# Patient Record
Sex: Female | Born: 1980
Health system: Southern US, Community
[De-identification: ages and names within clinical notes are randomized; demographics above are authoritative.]

## PROBLEM LIST (undated history)

## (undated) ENCOUNTER — Inpatient Hospital Stay (HOSPITAL_COMMUNITY): Payer: Self-pay

## (undated) DIAGNOSIS — Z87442 Personal history of urinary calculi: Secondary | ICD-10-CM

## (undated) DIAGNOSIS — I10 Essential (primary) hypertension: Secondary | ICD-10-CM

## (undated) DIAGNOSIS — N83209 Unspecified ovarian cyst, unspecified side: Secondary | ICD-10-CM

## (undated) DIAGNOSIS — N200 Calculus of kidney: Secondary | ICD-10-CM

## (undated) HISTORY — DX: Essential (primary) hypertension: I10

## (undated) HISTORY — DX: Calculus of kidney: N20.0

---
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
## 2002-03-05 ENCOUNTER — Encounter: Admission: RE | Admit: 2002-03-05 | Discharge: 2002-03-05 | Payer: Self-pay | Admitting: Family Medicine

## 2002-06-02 ENCOUNTER — Encounter: Admission: RE | Admit: 2002-06-02 | Discharge: 2002-06-02 | Payer: Self-pay | Admitting: Family Medicine

## 2002-07-02 ENCOUNTER — Encounter: Admission: RE | Admit: 2002-07-02 | Discharge: 2002-07-02 | Payer: Self-pay | Admitting: Family Medicine

## 2002-08-31 ENCOUNTER — Encounter: Admission: RE | Admit: 2002-08-31 | Discharge: 2002-08-31 | Payer: Self-pay | Admitting: Family Medicine

## 2002-10-18 ENCOUNTER — Encounter: Admission: RE | Admit: 2002-10-18 | Discharge: 2002-10-18 | Payer: Self-pay | Admitting: Sports Medicine

## 2002-10-18 ENCOUNTER — Other Ambulatory Visit: Admission: RE | Admit: 2002-10-18 | Discharge: 2002-10-18 | Payer: Self-pay | Admitting: Family Medicine

## 2002-10-18 ENCOUNTER — Encounter (INDEPENDENT_AMBULATORY_CARE_PROVIDER_SITE_OTHER): Payer: Self-pay | Admitting: *Deleted

## 2003-04-01 ENCOUNTER — Encounter: Admission: RE | Admit: 2003-04-01 | Discharge: 2003-04-01 | Payer: Self-pay | Admitting: Family Medicine

## 2003-04-15 ENCOUNTER — Encounter: Admission: RE | Admit: 2003-04-15 | Discharge: 2003-04-15 | Payer: Self-pay | Admitting: Family Medicine

## 2003-11-22 ENCOUNTER — Encounter (INDEPENDENT_AMBULATORY_CARE_PROVIDER_SITE_OTHER): Payer: Self-pay | Admitting: *Deleted

## 2003-11-22 LAB — CONVERTED CEMR LAB

## 2003-11-30 ENCOUNTER — Other Ambulatory Visit: Admission: RE | Admit: 2003-11-30 | Discharge: 2003-11-30 | Payer: Self-pay | Admitting: Family Medicine

## 2003-11-30 ENCOUNTER — Encounter: Admission: RE | Admit: 2003-11-30 | Discharge: 2003-11-30 | Payer: Self-pay | Admitting: Family Medicine

## 2003-12-14 ENCOUNTER — Encounter: Admission: RE | Admit: 2003-12-14 | Discharge: 2003-12-14 | Payer: Self-pay | Admitting: Family Medicine

## 2006-02-19 ENCOUNTER — Encounter: Payer: Self-pay | Admitting: Obstetrics & Gynecology

## 2006-11-10 ENCOUNTER — Emergency Department (HOSPITAL_COMMUNITY): Admission: EM | Admit: 2006-11-10 | Discharge: 2006-11-10 | Payer: Self-pay | Admitting: Emergency Medicine

## 2006-12-18 DIAGNOSIS — F17200 Nicotine dependence, unspecified, uncomplicated: Secondary | ICD-10-CM | POA: Insufficient documentation

## 2006-12-19 ENCOUNTER — Encounter (INDEPENDENT_AMBULATORY_CARE_PROVIDER_SITE_OTHER): Payer: Self-pay | Admitting: *Deleted

## 2007-01-13 ENCOUNTER — Inpatient Hospital Stay (HOSPITAL_COMMUNITY): Admission: AD | Admit: 2007-01-13 | Discharge: 2007-01-13 | Payer: Self-pay | Admitting: Gynecology

## 2007-06-03 ENCOUNTER — Ambulatory Visit: Payer: Self-pay | Admitting: Family Medicine

## 2007-06-03 ENCOUNTER — Encounter (INDEPENDENT_AMBULATORY_CARE_PROVIDER_SITE_OTHER): Payer: Self-pay | Admitting: Family Medicine

## 2007-06-03 DIAGNOSIS — N870 Mild cervical dysplasia: Secondary | ICD-10-CM | POA: Insufficient documentation

## 2007-06-03 LAB — CONVERTED CEMR LAB
Chlamydia, DNA Probe: NEGATIVE
Glucose, Bld: 82 mg/dL (ref 70–99)
LDL Cholesterol: 102 mg/dL — ABNORMAL HIGH (ref 0–99)
Triglycerides: 53 mg/dL (ref <150)
VLDL: 11 mg/dL (ref 0–40)
Whiff Test: POSITIVE

## 2007-06-18 ENCOUNTER — Ambulatory Visit: Payer: Self-pay | Admitting: Family Medicine

## 2007-06-18 LAB — CONVERTED CEMR LAB: Beta hcg, urine, semiquantitative: NEGATIVE

## 2007-06-30 ENCOUNTER — Encounter (INDEPENDENT_AMBULATORY_CARE_PROVIDER_SITE_OTHER): Payer: Self-pay | Admitting: Family Medicine

## 2007-07-30 ENCOUNTER — Telehealth: Payer: Self-pay | Admitting: *Deleted

## 2007-09-04 ENCOUNTER — Ambulatory Visit: Payer: Self-pay | Admitting: Family Medicine

## 2007-11-06 ENCOUNTER — Telehealth: Payer: Self-pay | Admitting: *Deleted

## 2007-11-09 ENCOUNTER — Ambulatory Visit: Payer: Self-pay | Admitting: Sports Medicine

## 2007-11-09 ENCOUNTER — Encounter (INDEPENDENT_AMBULATORY_CARE_PROVIDER_SITE_OTHER): Payer: Self-pay | Admitting: Family Medicine

## 2007-11-09 DIAGNOSIS — I10 Essential (primary) hypertension: Secondary | ICD-10-CM | POA: Insufficient documentation

## 2007-11-09 LAB — CONVERTED CEMR LAB
BUN: 8 mg/dL (ref 6–23)
Beta hcg, urine, semiquantitative: NEGATIVE
Blood in Urine, dipstick: NEGATIVE
CO2: 22 meq/L (ref 19–32)
Chloride: 107 meq/L (ref 96–112)
Glucose, Bld: 86 mg/dL (ref 70–99)
Glucose, Urine, Semiquant: NEGATIVE
Nitrite: NEGATIVE
Potassium: 3.8 meq/L (ref 3.5–5.3)
Sodium: 140 meq/L (ref 135–145)
Specific Gravity, Urine: 1.025
pH: 6.5

## 2008-07-08 ENCOUNTER — Telehealth (INDEPENDENT_AMBULATORY_CARE_PROVIDER_SITE_OTHER): Payer: Self-pay | Admitting: *Deleted

## 2008-07-08 ENCOUNTER — Ambulatory Visit: Payer: Self-pay | Admitting: Family Medicine

## 2008-07-14 ENCOUNTER — Ambulatory Visit: Payer: Self-pay | Admitting: Family Medicine

## 2008-07-14 ENCOUNTER — Encounter (INDEPENDENT_AMBULATORY_CARE_PROVIDER_SITE_OTHER): Payer: Self-pay | Admitting: Family Medicine

## 2008-07-14 LAB — CONVERTED CEMR LAB: Beta hcg, urine, semiquantitative: NEGATIVE

## 2008-07-15 LAB — CONVERTED CEMR LAB: GC Probe Amp, Genital: NEGATIVE

## 2008-07-19 ENCOUNTER — Encounter (INDEPENDENT_AMBULATORY_CARE_PROVIDER_SITE_OTHER): Payer: Self-pay | Admitting: Family Medicine

## 2008-07-21 ENCOUNTER — Emergency Department (HOSPITAL_COMMUNITY): Admission: EM | Admit: 2008-07-21 | Discharge: 2008-07-21 | Payer: Self-pay | Admitting: Family Medicine

## 2008-10-04 ENCOUNTER — Encounter (INDEPENDENT_AMBULATORY_CARE_PROVIDER_SITE_OTHER): Payer: Self-pay | Admitting: Family Medicine

## 2008-10-04 ENCOUNTER — Ambulatory Visit: Payer: Self-pay | Admitting: Family Medicine

## 2008-10-04 LAB — CONVERTED CEMR LAB
ALT: 10 units/L (ref 0–35)
AST: 9 units/L (ref 0–37)
Beta hcg, urine, semiquantitative: NEGATIVE
CO2: 19 meq/L (ref 19–32)
Calcium: 8.5 mg/dL (ref 8.4–10.5)
Chloride: 109 meq/L (ref 96–112)
Creatinine, Ser: 0.56 mg/dL (ref 0.40–1.20)
Sodium: 138 meq/L (ref 135–145)
Total CHOL/HDL Ratio: 3.8
Total Protein: 6.8 g/dL (ref 6.0–8.3)
VLDL: 7 mg/dL (ref 0–40)

## 2008-10-05 ENCOUNTER — Encounter (INDEPENDENT_AMBULATORY_CARE_PROVIDER_SITE_OTHER): Payer: Self-pay | Admitting: Family Medicine

## 2008-10-31 ENCOUNTER — Telehealth: Payer: Self-pay | Admitting: *Deleted

## 2008-10-31 ENCOUNTER — Ambulatory Visit: Payer: Self-pay | Admitting: Family Medicine

## 2008-10-31 LAB — CONVERTED CEMR LAB
Bilirubin Urine: NEGATIVE
Glucose, Urine, Semiquant: NEGATIVE
Ketones, urine, test strip: NEGATIVE
pH: 8.5

## 2008-11-01 ENCOUNTER — Encounter: Payer: Self-pay | Admitting: Family Medicine

## 2008-11-16 ENCOUNTER — Ambulatory Visit: Payer: Self-pay | Admitting: Family Medicine

## 2008-11-16 ENCOUNTER — Telehealth (INDEPENDENT_AMBULATORY_CARE_PROVIDER_SITE_OTHER): Payer: Self-pay | Admitting: Family Medicine

## 2008-11-16 LAB — CONVERTED CEMR LAB
Nitrite: NEGATIVE
Protein, U semiquant: 30
WBC Urine, dipstick: NEGATIVE

## 2008-11-20 ENCOUNTER — Emergency Department (HOSPITAL_COMMUNITY): Admission: EM | Admit: 2008-11-20 | Discharge: 2008-11-20 | Payer: Self-pay | Admitting: Emergency Medicine

## 2008-11-22 ENCOUNTER — Encounter (INDEPENDENT_AMBULATORY_CARE_PROVIDER_SITE_OTHER): Payer: Self-pay | Admitting: Family Medicine

## 2008-12-01 ENCOUNTER — Emergency Department (HOSPITAL_COMMUNITY): Admission: EM | Admit: 2008-12-01 | Discharge: 2008-12-01 | Payer: Self-pay | Admitting: Family Medicine

## 2008-12-02 ENCOUNTER — Ambulatory Visit: Payer: Self-pay | Admitting: Family Medicine

## 2009-05-02 ENCOUNTER — Ambulatory Visit: Payer: Self-pay | Admitting: Family Medicine

## 2009-05-02 ENCOUNTER — Encounter: Payer: Self-pay | Admitting: Family Medicine

## 2009-09-01 ENCOUNTER — Telehealth: Payer: Self-pay | Admitting: Family Medicine

## 2009-09-04 ENCOUNTER — Ambulatory Visit: Payer: Self-pay | Admitting: Family Medicine

## 2009-11-28 ENCOUNTER — Telehealth: Payer: Self-pay | Admitting: Family Medicine

## 2009-11-29 ENCOUNTER — Ambulatory Visit: Payer: Self-pay | Admitting: Family Medicine

## 2010-01-08 ENCOUNTER — Encounter: Payer: Self-pay | Admitting: Family Medicine

## 2010-05-14 ENCOUNTER — Telehealth: Payer: Self-pay | Admitting: Family Medicine

## 2010-05-14 ENCOUNTER — Ambulatory Visit: Payer: Self-pay | Admitting: Family Medicine

## 2010-05-14 DIAGNOSIS — N939 Abnormal uterine and vaginal bleeding, unspecified: Secondary | ICD-10-CM

## 2010-05-14 DIAGNOSIS — N926 Irregular menstruation, unspecified: Secondary | ICD-10-CM | POA: Insufficient documentation

## 2010-05-14 LAB — CONVERTED CEMR LAB: Beta hcg, urine, semiquantitative: NEGATIVE

## 2010-06-20 ENCOUNTER — Telehealth: Payer: Self-pay | Admitting: Family Medicine

## 2010-06-20 ENCOUNTER — Ambulatory Visit: Payer: Self-pay | Admitting: Family Medicine

## 2010-07-12 ENCOUNTER — Encounter: Payer: Self-pay | Admitting: Family Medicine

## 2010-07-12 ENCOUNTER — Telehealth: Payer: Self-pay | Admitting: Family Medicine

## 2010-07-12 ENCOUNTER — Ambulatory Visit: Payer: Self-pay | Admitting: Family Medicine

## 2010-08-24 ENCOUNTER — Encounter: Payer: Self-pay | Admitting: Family Medicine

## 2010-09-11 ENCOUNTER — Encounter: Payer: Self-pay | Admitting: Family Medicine

## 2010-09-11 ENCOUNTER — Ambulatory Visit: Payer: Self-pay | Admitting: Family Medicine

## 2010-09-11 LAB — CONVERTED CEMR LAB
Chlamydia, DNA Probe: NEGATIVE
GC Probe Amp, Genital: NEGATIVE

## 2010-11-20 NOTE — Assessment & Plan Note (Signed)
Summary: heavy ,painful menses//saunders   Vital Signs:  Patient profile:   30 year old female Weight:      186.0 pounds BMI:     31.06 Temp:     98.9 degrees [place] Pulse rate:   74 / minute BP sitting:   152 / 94  (left arm)  Vitals Entered By: Angeline Slim MD (May 14, 2010 11:12 AM) CC: painful, heavey menses Is Patient Diabetic? No Pain Assessment Patient in pain? yes     Location: abdomen Intensity: 8   Primary Care Provider:  Angelena Sole MD  CC:  painful and heavey menses.  History of Present Illness: Marie [place] with heavy menses x this cycle.  Last sexual intercourse last month.  G1P1.  Usually periods last 5-6 days.  First 2 days usually heavy, changing pads every 2 hrs.  Next 3-4 days changing pads every 4 hrs or so.  Usually has regular periods, monthly.  Sexually active with one partner.  Uses condoms, 100%.  Does not desire pregnancy.   This period is different because it is heavier.  Started on Wed, today is day #6.  She is heaving blood clots, changing pads every 3 hrs.  Cramping more than usual.  She usually have cramping in first 2 days, but she is still cramping now. No nausea, vomiting, no fever, no chills.  No dysuria.  No frequency.  NO dyspnea, fatigue, palpitations.  Last month she had vomiting x 2-3 days, but then her period started so she did not think anything of it.   Abd pain and cramping not relieved with OTC pain meds.    Habits & Providers  Alcohol-Tobacco-Diet     Tobacco Status: current     Cigarette Packs/Day: 0.5  Current Medications (verified): 1)  Lisinopril-Hydrochlorothiazide 20-25 Mg Tabs (Lisinopril-Hydrochlorothiazide) .... Take 1 Tab By Mouth Daily 2)  Hydrocodone-Homatropine 5-1.5 Mg/15ml Syrp (Hydrocodone-Homatropine) .... 1/2 To 1 Teaspoon By Mouth Every 12 Hours As Needed; 3)  Pseudoephedrine Hcl 60 Mg Tabs (Pseudoephedrine Hcl) .... One By Mouth Every 4-6 Hours As Needed 4)  Provera 10 Mg Tabs (Medroxyprogesterone Acetate) .Marland Kitchen.. 1  Tab By Mouth Daily X 5 Days -10 Days 5)  Norco 5-325 Mg Tabs (Hydrocodone-Acetaminophen) .Marland Kitchen.. 1 Tab By Mouth By Mouth Every 4-6 Hrs As Needed Pain  Allergies (verified): No Known Drug Allergies  Review of Systems       per hpi   Physical Exam  General:  Well-developed,well-nourished,in no acute distress; alert,appropriate and cooperative throughout examination. vitals reviewed.  Abdomen:  Bowel sounds positive,abdomen soft and non-tender without masses, organomegaly or hernias noted. Genitalia:  Normal introitus for age, no external lesions, no vaginal discharge, mucosa pink and moist, no vaginal or cervical lesions, no vaginal atrophy, no friaility, normal uterus size and position, no adnexal masses or tenderness +vaginal bleeding, clots seen.     Impression & Recommendations:  Problem # 1:  ABNORMAL VAGINAL BLEEDING (ICD-626.9) Assessment New First episode of abnormal uterine bleeding.  Besides bleeding, abd pain and cramping pt has no other complains.  Upreg negative.  Pt bleeding so cannot do pap or check gc/chlam.  Will stop bleeding with Provera.  Pt to rtc if still bleeding after 10 days.  If bleeding stops and return with next cycle, will do further work up [employer] u/s, abd u/s, TSH, LH/FSH).  Will give Hydrocodone 5-325 #15 tabs for pain.   Her updated medication list for this problem includes:    Provera 10 Mg Tabs (Medroxyprogesterone acetate) .Marland KitchenMarland KitchenMarland KitchenMarland Kitchen  1 tab by mouth daily x 5 days -10 days  Orders: U Preg-[facility] (81025) [facility]- Est Level  3 (62229)  Complete Medication List: 1)  Lisinopril-hydrochlorothiazide 20-25 Mg Tabs (Lisinopril-hydrochlorothiazide) .... Take 1 tab by mouth daily 2)  Hydrocodone-homatropine 5-1.5 Mg/2ml Syrp (Hydrocodone-homatropine) .... 1/2 to 1 teaspoon by mouth every 12 hours as needed; 3)  Pseudoephedrine Hcl 60 Mg Tabs (Pseudoephedrine hcl) .... One by mouth every 4-6 hours as needed 4)  Provera 10 Mg Tabs (Medroxyprogesterone acetate) .Marland Kitchen.. 1 tab by  mouth daily x 5 days -10 days 5)  Norco 5-325 Mg Tabs (Hydrocodone-acetaminophen) .Marland Kitchen.. 1 tab by mouth by mouth every 4-6 hrs as needed pain  Patient Instructions: 1)  Please schedule a follow-up appointment if the medication we give you does not stop your bleeding in 10 days.  2)  If you have abnormal bleeding again please make an appointment with [place] for further work up. 3)  You need to have a Pap Smear to prevent cervical cancer for Sept. 4)  New med: Provera 10mg  daily 5-10 days.  Continue taking this medication for 2 extra when your period stops.  Prescriptions: NORCO 5-325 MG TABS (HYDROCODONE-ACETAMINOPHEN) 1 tab by mouth by mouth every 4-6 hrs as needed pain  #15 x 0   Entered and Authorized by:   Angeline Slim MD   Signed by:   Angeline Slim MD on 05/14/2010   Method used:   Telephoned to ...       [facility] [phone]* (retail)       [address]       [place], [place]  [address]       Ph: [phone]       Fax: [phone]   RxID:   [id number] PROVERA 10 MG TABS (MEDROXYPROGESTERONE ACETATE) 1 tab by mouth daily x 5 days -10 days  #10 x 0   Entered and Authorized by:   Angeline Slim MD   Signed by:   Angeline Slim MD on 05/14/2010   Method used:   Electronically to        [facility] [phone]* (retail)       [address]       [place], [place]  [address]       Ph: [phone]       Fax: [phone]   RxID:   [id number]   Laboratory Results   Urine Tests  Date/Time Received: May 14, 2010 11:45 AM  Date/Time Reported: May 14, 2010 12:04 PM     Urine HCG: negative Comments: ...........test performed by...........Marland KitchenTerese Door, CMA       Prevention & Chronic Care Immunizations   Influenza vaccine: refused  (07/14/2008)   Influenza vaccine due: Refused  (07/14/2008)    Tetanus booster: 07/14/2008: given   Tetanus booster due: 07/14/2018    Pneumococcal vaccine: Not documented  Other Screening   Pap  smear: NEGATIVE FOR INTRAEPITHELIAL LESIONS OR MALIGNANCY.  (07/14/2008)   Pap smear due: 11/21/2004   Smoking status: current  (05/14/2010)   Smoking cessation counseling: yes  (11/16/2008)  Hypertension   Last Blood Pressure: 152 / 94  (05/14/2010)   Serum creatinine: 0.56  (10/04/2008)   Serum potassium 4.1  (10/04/2008)  Self-Management Support :   Personal Goals (by the next clinic visit) :      Personal blood pressure goal: 130/80  (09/04/2009)   Hypertension self-management  support: Not documented

## 2010-11-20 NOTE — Assessment & Plan Note (Signed)
Summary: cough & cold/Marie Lawrence/saunders   Vital Signs:  Patient profile:   30 year old female Height:      65 inches Weight:      192 pounds BMI:     32.07 BSA:     1.95 Temp:     97.9 degrees F Pulse rate:   82 / minute BP sitting:   130 / 85  Vitals Entered By: Jone Baseman CMA (November 29, 2009 8:55 AM) CC: cough and cold x 2 days Is Patient Diabetic? No Pain Assessment Patient in pain? no        Primary Care Provider:  Cyndia Bent MD  CC:  cough and cold x 2 days.  History of Present Illness: 1. 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 Has a scratchy throat but no pain. No problems swallowing.   Did not get flu shot this year.   Habits & Providers  Alcohol-Tobacco-Diet     Tobacco Status: current     Tobacco Counseling: to quit use of tobacco products     Cigarette Packs/Day: 0.5  Current Medications (verified): 1)  Lisinopril-Hydrochlorothiazide 20-25 Mg Tabs (Lisinopril-Hydrochlorothiazide) .... Take 1 Tab By Mouth Daily  Allergies (verified): No Known Drug Allergies  Past History:  Past Medical History: Last updated: 11/09/2007 abnormal paps 2002 -CIN 1, Hx of chlamydia x 2 in 2002, Colposcopy CIN I - has had one normal pap since this time G1P1001. HTN  Review of Systems       no chest pain or SOB; no nausea, vomting, diarrhea; no rash, joint or muscle aches; no fever.   Physical Exam  Additional Exam:  General:  Vital signs reviewed --  Alert, appropriate; well-dressed and well-nourished HEENT: EOMI, sclerae clear, OP pink and moist, no erythema or exudate; TMs unremarkable bilaterally Neck: normal ROM, no stiffness. Mild cervical LAD Lungs:  work of breathing unlabored, clear to auscultation bilaterally; no wheezes, rales, or ronchi; good air movement throughout Heart:  regular rate and rhythm, no murmurs; normal s1/s2 Pulses:  DP and  radial pulses 2+ bilaterally  Extremities:  no cyanosis, clubbing, or edema Skin: good turgor, brisk cap refill; no rash Neurologic:  alert and oriented. speech normal.    Impression & Recommendations:  Problem # 1:  UPPER RESPIRATORY INFECTION, VIRAL (ICD-465.9) Assessment New  benign exam. Supportive care.  See instructions.  Her updated medication list for this problem includes:    Hydrocodone-homatropine 5-1.5 Mg/34ml Syrp (Hydrocodone-homatropine) .Marland Kitchen... 1/2 to 1 teaspoon by mouth every 12 hours as needed;  Orders: [facility]- Est Level  3 (46962)  Complete Medication List: 1)  Lisinopril-hydrochlorothiazide 20-25 Mg Tabs (Lisinopril-hydrochlorothiazide) .... Take 1 tab by mouth daily 2)  Hydrocodone-homatropine 5-1.5 Mg/69ml Syrp (Hydrocodone-homatropine) .... 1/2 to 1 teaspoon by mouth every 12 hours as needed; 3)  Pseudoephedrine Hcl 60 Mg Tabs (Pseudoephedrine hcl) .... One by mouth every 4-6 hours as needed  Patient Instructions: 1)  This is a virus. It should run its course in about 7-10 days. 2)  Take ibuprofen 600 mg three times a day to help with pain, headache and fever. Take with food. 3)  Take pseudoephedrine to help with congestion and headache.  4)  Some times gargling with saltwater can help your throat.  5)  Make sure you're drinking plenty of fluids. Your urine should run clear. 6)  Call if you feel worse suddenly or  if not some better in the next 7 days.  Prescriptions: PSEUDOEPHEDRINE HCL 60 MG TABS (PSEUDOEPHEDRINE HCL) one by mouth every 4-6 hours as needed  #30 x 0   Entered and Authorized by:   Myrtie Soman  MD   Signed by:   Myrtie Soman  MD on 11/29/2009   Method used:   Print then Give to Patient   RxID:   [id number] HYDROCODONE-HOMATROPINE 5-1.5 MG/5ML SYRP (HYDROCODONE-HOMATROPINE) 1/2 to 1 teaspoon by mouth every 12 hours as needed;  #60 cc x 0   Entered and Authorized by:   Myrtie Soman  MD   Signed by:   Myrtie Soman  MD on 11/29/2009    Method used:   Print then Give to Patient   RxID:   [id number] PSEUDOEPHEDRINE HCL 60 MG TABS (PSEUDOEPHEDRINE HCL) one by mouth every 4-6 hours as needed  #30 x 0   Entered and Authorized by:   Myrtie Soman  MD   Signed by:   Myrtie Soman  MD on 11/29/2009   Method used:   Print then Give to Patient   RxID:   [id number] HYDROCODONE-HOMATROPINE 5-1.5 MG/5ML SYRP (HYDROCODONE-HOMATROPINE) 1/2 to 1 teaspoon by mouth every 12 hours as needed;  #60 cc x 0   Entered and Authorized by:   Myrtie Soman  MD   Signed by:   Myrtie Soman  MD on 11/29/2009   Method used:   Print then Give to Patient   RxID:   [id number]

## 2010-11-20 NOTE — Letter (Signed)
Summary: Out of Work  [facility]  [address]   [place], [place] [address]   Phone: [phone]  Fax: [phone]    July 12, 2010   Employee:  GARGI BERCH Mackins    To Whom It May Concern:   For Medical reasons, please excuse the above named employee from work for the following dates:  Start: 07/11/10  Return: 07/13/10     If you need additional information, please feel free to contact our office.         Sincerely,    Bobby Rumpf  MD

## 2010-11-20 NOTE — Progress Notes (Signed)
Summary: triage  Phone Note Call from Patient Call back at Home Phone [phone]   Caller: Patient Summary of Call: still passing large clots Initial call taken by: De Nurse,  June 20, 2010 8:43 AM  Follow-up for Phone Call        was seen in July. that period stopped &  she started bleeding  9 days ago. painful 1st 2 days. better now. heavy flow & large clots. she states she is worried & wants to see md. appt at 2:30 with her pcp. advised to drink plenty of water Follow-up by: Golden Circle RN,  June 20, 2010 8:48 AM

## 2010-11-20 NOTE — Assessment & Plan Note (Signed)
Summary: n&v,diarrhea-note for work/[place]/saunders   Vital Signs:  Patient profile:   30 year old female Height:      65 inches Weight:      183.4 pounds BMI:     30.63 Temp:     98.4 degrees F oral Pulse rate:   80 / minute BP sitting:   131 / 90  (left arm) Cuff size:   regular  Vitals Entered By: Garen Grams LPN (July 12, 2010 4:09 PM) CC: diarrhea/nausea/vomiting/headache x 2 days Is Patient Diabetic? No Pain Assessment Patient in pain? no        Primary Care Provider:  Angelena Sole MD  CC:  diarrhea/nausea/vomiting/headache x 2 days.  History of Present Illness: 1) Diarrhea / emesis: Nausea and vomiting (now resolved) and diarrhea  x 2 days. Mild headache as well now resolved. Emesis contained food, occurred 2-3 times yesterday, now resolved. Diarrhea occurred all night light night, now resolved. Recent travel to [place] last weekend. Denies sick contacts or new foods. LMP was 3 weeks ago.   ROS: Denies fever, chills, rash, melena, hematochezia, bilious emesis  Current Medications (verified): 1)  Lisinopril-Hydrochlorothiazide 20-25 Mg Tabs (Lisinopril-Hydrochlorothiazide) .... Take 1 Tab By Mouth Daily 2)  Pseudoephedrine Hcl 60 Mg Tabs (Pseudoephedrine Hcl) .... One By Mouth Every 4-6 Hours As Needed 3)  Provera 10 Mg Tabs (Medroxyprogesterone Acetate) .Marland Kitchen.. 1 Tab By Mouth Daily X 5 Days -10 Days 4)  Sprintec 28 0.25-35 Mg-Mcg Tabs (Norgestimate-Eth Estradiol) .Marland Kitchen.. 1 Tab By Mouth Daily As Directed Dispo: 1 Pack  Allergies (verified): No Known Drug Allergies  Physical Exam  General:  obese, tired-appearing, NAD, vitals reviewed  Mouth:  moist membranes  Neck:  no lymphadenopathy   Lungs:  normal respiratory effort and no wheezes.   Abdomen:  Bowel sounds positive,abdomen soft and non-tender without masses, organomegaly or hernias noted. Pulses:  2+ radials  Skin:  turgor normal and color normal.     Impression & Recommendations:  Problem # 1:   DIARRHEA (ICD-787.91) Assessment New  Likely viral gastroenteritis. No red flags for more serious etiology on history or exam. Symptoms improving. Does not want antiemetic at this time. Advised to use Imodium for diarrhea as needed. Follow up if symptoms worsening.   Orders: [facility]- Est Level  3 (04540)  Complete Medication List: 1)  Lisinopril-hydrochlorothiazide 20-25 Mg Tabs (Lisinopril-hydrochlorothiazide) .... Take 1 tab by mouth daily 2)  Pseudoephedrine Hcl 60 Mg Tabs (Pseudoephedrine hcl) .... One by mouth every 4-6 hours as needed 3)  Provera 10 Mg Tabs (Medroxyprogesterone acetate) .Marland Kitchen.. 1 tab by mouth daily x 5 days -10 days 4)  Sprintec 28 0.25-35 Mg-mcg Tabs (Norgestimate-eth estradiol) .Marland Kitchen.. 1 tab by mouth daily as directed dispo: 1 pack  Patient Instructions: 1)  You can take Imodium for diarrhea, 2)  Continue to stay well hydrated. 3)  IF things are getting worse, give [place] a call.

## 2010-11-20 NOTE — Miscellaneous (Signed)
  Clinical Lists Changes  Problems: Removed problem of EPISTAXIS (ICD-784.7) Removed problem of SKIN LESION (ICD-709.9) Removed problem of UTI'S, RECURRENT (ICD-599.0) Removed problem of ABDOMINAL PAIN, LEFT UPPER QUADRANT (ICD-789.02) Removed problem of PYELONEPHRITIS, ACUTE (ICD-590.10) Removed problem of FLANK PAIN, LEFT (ICD-789.09) Removed problem of SCREENING FOR MALIGNANT NEOPLASM OF THE CERVIX (ICD-V76.2) Removed problem of ABNORMAL VAGINAL BLEEDING (ICD-626.9) Removed problem of RIB PAIN, LEFT SIDED (ICD-786.50) Removed problem of PREVENTIVE HEALTH CARE (ICD-V70.0) Removed problem of SCREENING, DIABETES MELLITUS (ICD-V77.1) Removed problem of SEXUALLY TRANSMITTED DISEASE, EXPOSURE TO (ICD-V01.6) Removed problem of SCREENING FOR LIPOID DISORDERS (ICD-V77.91) Removed problem of AMENORRHEA (ICD-626.0)

## 2010-11-20 NOTE — Progress Notes (Signed)
Summary: triage  Phone Note Call from Patient Call back at Home Phone [phone]   Caller: Patient Summary of Call: wants to come in tomorrow for cough/scratchy throat/ Initial call taken by: De Nurse,  November 28, 2009 3:29 PM  Follow-up for Phone Call        she will come to 8:30 work in spot. taking OTCs Follow-up by: Golden Circle RN,  November 28, 2009 3:42 PM

## 2010-11-20 NOTE — Letter (Signed)
Summary: Out of Work  [facility]  [address]   [place], [place] [address]   Phone: [phone]  Fax: [phone]    January 08, 2010   Employee:  BRENLYN BESHARA Johns    To Whom It May Concern:   For Medical reasons, please excuse the above named employee from work for the following dates:  Start:  January 08, 2010   End:  January 08, 2010   Patient's daughter was seen this AM in clinic.  If you need additional information, please feel free to contact our office.         Sincerely,    Eustaquio Boyden  MD

## 2010-11-20 NOTE — Progress Notes (Signed)
Summary: triage  Phone Note Call from Patient Call back at Home Phone [phone]   Caller: Patient Summary of Call: Pt throwing up and has diahrrea. Initial call taken by: Clydell Hakim,  July 12, 2010 11:05 AM  Follow-up for Phone Call        started last night. no one else is sick. no fever. last vomit 30 minutes ago.  diarrhea x 3 hrs. last one at 9am. she is at work & has to leave. did not want an appt until I told her will need to see md if she needs a note for work. she cannot get here until late today. will see Dr. Wallene Huh at 4 Follow-up by: Golden Circle RN,  July 12, 2010 11:06 AM

## 2010-11-20 NOTE — Progress Notes (Signed)
Summary: triage  Phone Note Call from Patient Call back at Home Phone [phone]   Caller: Patient Summary of Call: Pt is on her menstral and her stomach is very sore and she has been bleeding since last Wednesday and the blood is very dark. Initial call taken by: Clydell Hakim,  May 14, 2010 9:37 AM  Follow-up for Phone Call        LM Follow-up by: Golden Circle RN,  May 14, 2010 10:09 AM  Additional Follow-up for Phone Call Additional follow up Details #1::        this has not happened before. states she is cramping bad. OTC do not help. this is day 6. flow is heavy. changes Q3 hours. does not use birth control. last sex x 1 month ago.  placed in Dr. Mack Hook schedule as she had a cancellation for that time slot Additional Follow-up by: Golden Circle RN,  May 14, 2010 10:21 AM

## 2010-11-20 NOTE — Assessment & Plan Note (Signed)
Summary: physical/pap/bmc   Vital Signs:  Patient profile:   30 year old female Height:      65 inches Weight:      184 pounds BMI:     30.73 Temp:     98.5 degrees F oral Pulse rate:   70 / minute BP sitting:   154 / 106  (left arm) Cuff size:   regular  Vitals Entered By: Garen Grams LPN (September 11, 2010 2:14 PM) CC: CPP Is Patient Diabetic? No Pain Assessment Patient in pain? no        Primary Care Wallace Gappa:  Angelena Sole MD  CC:  CPP.  History of Present Illness: Annual exam  Questions / Concerns: None  Medical Issues 1. Menorrhaghia:  much better since last visit.  OCPs have helped.  She still has longer periods but they are manageable.  2. HTN:  She hasn't been taking her medicines as prescribed for the past couple of days.  She occassionally checks her blood pressure at the drug store and it is controlled when she has taken her medicine.  Prevention 1. Due for PAP smear - Has had one abnormal in the past with a Colpo showing CIN I 2. Smoking - She continues to smoke, not interested in quitting.  Habits & Providers  Alcohol-Tobacco-Diet     Tobacco Status: current     Tobacco Counseling: to quit use of tobacco products     Cigarette Packs/Day: 0.5     Diet Comments: salt restriction  Current Medications (verified): 1)  Lisinopril-Hydrochlorothiazide 20-25 Mg Tabs (Lisinopril-Hydrochlorothiazide) .... Take 1 Tab By Mouth Daily 2)  Sprintec 28 0.25-35 Mg-Mcg Tabs (Norgestimate-Eth Estradiol) .Marland Kitchen.. 1 Tab By Mouth Daily As Directed Dispo: 1 Pack  Allergies: No Known Drug Allergies  Past History:  Past Medical History: Reviewed history from 11/09/2007 and no changes required. abnormal paps 2002 -CIN 1, Hx of chlamydia x 2 in 2002, Colposcopy CIN I - has had one normal pap since this time G1P1001. HTN  Social History: Reviewed history from 07/14/2008 and no changes required. Pt has a daugter born is 5/00 (ayuante), intermittent help from  boyfriend . Smokes 1/2 pack per day. ETOH - occasionally, Drinks a couple drinks a week.  [employer] long hours - 50 hours.  Physical Exam  General:  obese, NAD, vitals reviewed  Head:  normocephalic and atraumatic.   Eyes:  vision grossly intact.  Fundoscopic exam benign Ears:  R ear normal and L ear normal.   Mouth:  pharynx pink and moist.   Neck:  supple, full ROM, and no masses.   Lungs:  normal respiratory effort and no wheezes.   Heart:  normal rate, regular rhythm, and no murmur.   Abdomen:  Bowel sounds positive,abdomen soft and non-tender without masses, organomegaly or hernias noted. Genitalia:  Normal introitus for age, no external lesions, no vaginal discharge, mucosa pink and moist, no vaginal or cervical lesions, no vaginal atrophy, no friaility, normal uterus size and position, no adnexal masses or tenderness.  No vaginal bleeding or discharge.   Per last exam Extremities:  no LE edema Skin:  turgor normal, color normal, and no rashes.   Psych:  not anxious appearing and not depressed appearing.     Impression & Recommendations:  Problem # 1:  Preventive Health Care (ICD-V70.0) Assessment Unchanged Doing well.  Routine follow up.  Problem # 2:  HYPERTENSION (ICD-401.9) Assessment: Deteriorated Not taking her medicine as prescribed.  Advised her to start taking her medicine.  Will have her follow up in 6-8 weeks to recheck Her updated medication list for this problem includes:    Lisinopril-hydrochlorothiazide 20-25 Mg Tabs (Lisinopril-hydrochlorothiazide) .Marland Kitchen... Take 1 tab by mouth daily  Problem # 3:  ABNORMAL VAGINAL BLEEDING (ICD-626.9) Assessment: Improved  The following medications were removed from the medication list:    Provera 10 Mg Tabs (Medroxyprogesterone acetate) .Marland Kitchen... 1 tab by mouth daily x 5 days -10 days Her updated medication list for this problem includes:    Sprintec 28 0.25-35 Mg-mcg Tabs (Norgestimate-eth estradiol) .Marland Kitchen... 1 tab by mouth daily  as directed dispo: 1 pack  Complete Medication List: 1)  Lisinopril-hydrochlorothiazide 20-25 Mg Tabs (Lisinopril-hydrochlorothiazide) .... Take 1 tab by mouth daily 2)  Sprintec 28 0.25-35 Mg-mcg Tabs (Norgestimate-eth estradiol) .Marland Kitchen.. 1 tab by mouth daily as directed dispo: 1 pack  Other Orders: GC/Chlamydia-[facility] (87591/87491) Pap Smear-[facility] ([id number]) [facility] - Est  18-39 yrs ([address])  Patient Instructions: 1)  I will let you know the results of your PAP smear 2)  I would like for you to come back in 6-8 weeks to recheck your blood pressure   Orders Added: 1)  GC/Chlamydia-[facility] [[id number]/87491] 2)  Pap Smear-[facility] [[id number]] 3)  [facility] - Est  18-39 yrs [[id number]]

## 2010-11-20 NOTE — Assessment & Plan Note (Signed)
Summary: lg clots & heavy bleeding 9 days/[place]   Vital Signs:  Patient profile:   30 year old female Height:      65 inches Weight:      188 pounds BMI:     31.40 BSA:     1.93 Pulse rate:   96 / minute BP sitting:   145 / 95  Vitals Entered By: Jone Baseman CMA (June 20, 2010 2:38 PM) CC: large clot and heavy bleeding Is Patient Diabetic? No Pain Assessment Patient in pain? yes     Location: lower abdomen Intensity: 3   Primary Care Provider:  Angelena Sole MD  CC:  large clot and heavy bleeding.  History of Present Illness: 1. Heavy menstrual bleeding: She has had heavy bleeding for the past 9 days.  She was seen in clinic for this last month and given a course of Provera which did stop the bleeding.  However, when her next period started she had heavy bleeding again.  She is changing pads about every 3-4 hours and is also having large clots.  Prior to last month she was having normal periods.  Associated with some lower abdominal cramping.  ROS: denies chest pain, light-headedness, dizziness, dysuria, vaginal discharge.  Habits & Providers  Alcohol-Tobacco-Diet     Tobacco Status: current     Tobacco Counseling: to quit use of tobacco products     Cigarette Packs/Day: 0.5  Current Medications (verified): 1)  Lisinopril-Hydrochlorothiazide 20-25 Mg Tabs (Lisinopril-Hydrochlorothiazide) .... Take 1 Tab By Mouth Daily 2)  Pseudoephedrine Hcl 60 Mg Tabs (Pseudoephedrine Hcl) .... One By Mouth Every 4-6 Hours As Needed 3)  Provera 10 Mg Tabs (Medroxyprogesterone Acetate) .Marland Kitchen.. 1 Tab By Mouth Daily X 5 Days -10 Days 4)  Sprintec 28 0.25-35 Mg-Mcg Tabs (Norgestimate-Eth Estradiol) .Marland Kitchen.. 1 Tab By Mouth Daily As Directed Dispo: 1 Pack  Allergies: No Known Drug Allergies  Past History:  Past Medical History: Reviewed history from 11/09/2007 and no changes required. abnormal paps 2002 -CIN 1, Hx of chlamydia x 2 in 2002, Colposcopy CIN I - has had one normal pap since  this time G1P1001. HTN  Social History: Reviewed history from 07/14/2008 and no changes required. Pt has a daugter born is 5/00 (ayuante), intermittent help from boyfriend . Smokes 1/2 pack per day. ETOH - occasionally, Drinks a couple drinks a week.  [employer] long hours - 50 hours.  Physical Exam  General:  Well-developed,well-nourished,in no acute distress; alert,appropriate and cooperative throughout examination. vitals reviewed.  Neck:  supple and no masses.   Lungs:  normal respiratory effort and no wheezes.   Heart:  normal rate, regular rhythm, and no murmur.   Abdomen:  Bowel sounds positive,abdomen soft and non-tender without masses, organomegaly or hernias noted. Genitalia:  Normal introitus for age, no external lesions, no vaginal discharge, mucosa pink and moist, no vaginal or cervical lesions, no vaginal atrophy, no friaility, normal uterus size and position, no adnexal masses or tenderness +vaginal bleeding, clots seen.   Per last exam Extremities:  no lower extremity edema Skin:  turgor normal and color normal.     Impression & Recommendations:  Problem # 1:  ABNORMAL VAGINAL BLEEDING (ICD-626.9) Assessment Deteriorated  Heavy bleeding again this month.  Likely 2/2 too much endometrial tissue.  Provera improved symptoms.  Will again use Provera to break the bleeding cycle and then start a birth control pill to help regulate her cycle.  If that doesn't help would just put her on Provera monthly. Her  updated medication list for this problem includes:    Provera 10 Mg Tabs (Medroxyprogesterone acetate) .Marland Kitchen... 1 tab by mouth daily x 5 days -10 days    Sprintec 28 0.25-35 Mg-mcg Tabs (Norgestimate-eth estradiol) .Marland Kitchen... 1 tab by mouth daily as directed dispo: 1 pack  Orders: [facility]- Est Level  3 (16109)  Complete Medication List: 1)  Lisinopril-hydrochlorothiazide 20-25 Mg Tabs (Lisinopril-hydrochlorothiazide) .... Take 1 tab by mouth daily 2)  Pseudoephedrine Hcl 60 Mg  Tabs (Pseudoephedrine hcl) .... One by mouth every 4-6 hours as needed 3)  Provera 10 Mg Tabs (Medroxyprogesterone acetate) .Marland Kitchen.. 1 tab by mouth daily x 5 days -10 days 4)  Sprintec 28 0.25-35 Mg-mcg Tabs (Norgestimate-eth estradiol) .Marland Kitchen.. 1 tab by mouth daily as directed dispo: 1 pack  Patient Instructions: 1)  For this bleeding, take the Provera for the next 5-10 days or until the bleeding stops 2)  Once the bleeding stops, start taking the birth control pill as directed 3)  It may take a couple of months for your periods to normalize 4)  If the bleeding doesn't stop or if you start feeling dizzy, weak, or light-headed then return to clinic 5)  Please schedule a follow up appointment in 3 months Prescriptions: PROVERA 10 MG TABS (MEDROXYPROGESTERONE ACETATE) 1 tab by mouth daily x 5 days -10 days  #10 x 0   Entered and Authorized by:   Angelena Sole MD   Signed by:   Angelena Sole MD on 06/20/2010   Method used:   Electronically to        [facility] [phone]* (retail)       [address]       [place], [place]  [address]       Ph: [phone]       Fax:    RxID:   [id number] SPRINTEC 28 0.25-35 MG-MCG TABS (NORGESTIMATE-ETH ESTRADIOL) 1 tab by mouth daily as directed Dispo: 1 pack  #1 x 6   Entered and Authorized by:   Angelena Sole MD   Signed by:   Angelena Sole MD on 06/20/2010   Method used:   Electronically to        [facility] [phone]* (retail)       [address]       [place], [place]  [address]       Ph: [phone]       Fax:    RxID:   [id number]

## 2010-12-12 ENCOUNTER — Ambulatory Visit (INDEPENDENT_AMBULATORY_CARE_PROVIDER_SITE_OTHER): Payer: BC Managed Care – PPO | Admitting: Family Medicine

## 2010-12-12 ENCOUNTER — Encounter: Payer: Self-pay | Admitting: Family Medicine

## 2010-12-12 ENCOUNTER — Other Ambulatory Visit: Payer: Self-pay | Admitting: Family Medicine

## 2010-12-12 DIAGNOSIS — N926 Irregular menstruation, unspecified: Secondary | ICD-10-CM

## 2010-12-12 DIAGNOSIS — I10 Essential (primary) hypertension: Secondary | ICD-10-CM

## 2010-12-12 DIAGNOSIS — N760 Acute vaginitis: Secondary | ICD-10-CM

## 2010-12-12 DIAGNOSIS — N939 Abnormal uterine and vaginal bleeding, unspecified: Secondary | ICD-10-CM

## 2010-12-12 LAB — POCT URINE PREGNANCY: Preg Test, Ur: NEGATIVE

## 2010-12-12 LAB — CONVERTED CEMR LAB
Chlamydia, DNA Probe: NEGATIVE
MCHC: 31.4 g/dL (ref 30.0–36.0)
RBC: 4.24 M/uL (ref 3.87–5.11)
TSH: 0.557 microintl units/mL (ref 0.350–4.500)

## 2010-12-12 LAB — CBC
HCT: 34.4 % — ABNORMAL LOW (ref 36.0–46.0)
MCHC: 31.4 g/dL (ref 30.0–36.0)
RDW: 16 % — ABNORMAL HIGH (ref 11.5–15.5)

## 2010-12-12 LAB — POCT WET PREP (WET MOUNT): Trichomonas Wet Prep HPF POC: NEGATIVE

## 2010-12-12 LAB — TSH: TSH: 0.557 u[IU]/mL (ref 0.350–4.500)

## 2010-12-12 MED ORDER — METRONIDAZOLE 500 MG PO TABS: 500.0000 mg | ORAL_TABLET | Freq: Two times a day (BID) | ORAL | Status: DC

## 2010-12-12 NOTE — Progress Notes (Signed)
  Subjective:    Patient ID: Marie Lawrence, female    DOB: 09-Oct-1981, 30 y.o.   MRN: [id number]  HPI DUB:  Has been taking ortho cyclen for 3-4 months to control dysfunctional uterine bleeding.  Prior to this has been having heavy periods for the first 3-4 days of menses lasting about 7 days.  Notes regular monthly menses with rare intermenstrual bleeding.  During the first few months of ortho cyclen, had normal 5 day period.  This month has had light to moderate bleeding for 2 weeks and had several days of heavy bleeding needing to change a pad every 2  Hours, now resolved.  No lightheadedness or dizziness.  In past was on Depo provera several years ago with cessation of menses.  Good about taking pills at the same time daily- has not missed any or taken any late as she sets her phone alarm.  Tender abdominal crampy pain.  No fever/chills.   Hypertension:  Notes today on vitals.  Has not been checking BP.  Did not take BP meds this AM.  No CP, SOB, edema.   Review of Systems See HPI     Objective:   Physical Exam  Genitourinary: Rectum normal. There is no rash, tenderness, lesion or injury on the right labia. There is injury on the left labia. There is no rash, tenderness or lesion on the left labia. Uterus is tender. Cervix exhibits no motion tenderness, no discharge and no friability. Right adnexum displays no mass, no tenderness and no fullness. Left adnexum displays no mass, no tenderness and no fullness. There is bleeding around the vagina.          Assessment & Plan:

## 2010-12-12 NOTE — Patient Instructions (Signed)
Will get transvaginal ultrasound to look at uterus Keep taking birth control until you decide what the next method will be Its important to take your blood pressure daily- keep log and bring to next appointment in 2 weeks.

## 2010-12-13 ENCOUNTER — Inpatient Hospital Stay (HOSPITAL_COMMUNITY): Admission: RE | Admit: 2010-12-13 | Payer: BC Managed Care – PPO | Source: Ambulatory Visit

## 2010-12-13 LAB — GC/CHLAMYDIA PROBE AMP, GENITAL
Chlamydia, DNA Probe: NEGATIVE
GC Probe Amp, Genital: NEGATIVE

## 2010-12-13 NOTE — Assessment & Plan Note (Signed)
Elevated today but did not take medications.  Advised to keep BP log, take medications regularly and make follow-up with PCP for further adjustment.  Reminded patient of importance of birth control given ACE-I and discussed transitioning off estrogen and onto Mirena for DUB as BP continues to be poorly controlled.

## 2010-12-13 NOTE — Assessment & Plan Note (Addendum)
Workup by PCP thus far has included Normal pap, GC/Chlamydia in November 2011.  Placed on combination OCP with some regulation of DUB, but now recurrence of pain + bleeding.  Today repeated wet/prep, GC/Chlamydia and will obtain TSH, CBC.  Will order transvaginal US to evaluate. Discussed with patient that Mirena IUD would be a good option for her to control bleeding.  Additionally would prefer to avoid estrogen in obese smoker with poorly controlled hypertension.  Patient will think about Mirena.

## 2010-12-18 ENCOUNTER — Encounter: Payer: Self-pay | Admitting: Family Medicine

## 2010-12-18 MED ORDER — METRONIDAZOLE 500 MG PO TABS: 500.0000 mg | ORAL_TABLET | Freq: Two times a day (BID) | ORAL | Status: AC

## 2010-12-18 NOTE — Progress Notes (Signed)
Addended by: Delbert Harness on: 12/18/2010 10:06 AM   Modules accepted: Orders

## 2010-12-18 NOTE — Assessment & Plan Note (Addendum)
Clue cells seen on Wet prep.  Will prescribe flagyl for BV

## 2011-01-14 ENCOUNTER — Other Ambulatory Visit: Payer: Self-pay | Admitting: Family Medicine

## 2011-01-14 NOTE — Telephone Encounter (Signed)
Refill request

## 2011-02-04 LAB — COMPREHENSIVE METABOLIC PANEL
ALT: 16 U/L (ref 0–35)
AST: 19 U/L (ref 0–37)
Albumin: 4.2 g/dL (ref 3.5–5.2)
CO2: 24 mEq/L (ref 19–32)
Calcium: 9.2 mg/dL (ref 8.4–10.5)
GFR calc Af Amer: >60 mL/min (ref >60)
Sodium: 138 mEq/L (ref 135–145)
Total Protein: 8 g/dL (ref 6.0–8.3)

## 2011-02-04 LAB — DIFFERENTIAL
Eosinophils Absolute: 0 10*3/uL (ref 0.0–0.7)
Eosinophils Relative: 0 % (ref 0–5)
Lymphs Abs: 1 10*3/uL (ref 0.7–4.0)
Monocytes Absolute: 0.5 10*3/uL (ref 0.1–1.0)
Monocytes Relative: 4 % (ref 3–12)

## 2011-02-04 LAB — URINE MICROSCOPIC-ADD ON

## 2011-02-04 LAB — URINALYSIS, ROUTINE W REFLEX MICROSCOPIC
Bilirubin Urine: NEGATIVE
Nitrite: NEGATIVE
Specific Gravity, Urine: 1.02 (ref 1.005–1.030)
pH: 8.5 — ABNORMAL HIGH (ref 5.0–8.0)

## 2011-02-04 LAB — CBC
MCHC: 33.3 g/dL (ref 30.0–36.0)
RBC: 4.47 MIL/uL (ref 3.87–5.11)
RDW: 13.6 % (ref 11.5–15.5)

## 2011-02-04 LAB — WET PREP, GENITAL: Trich, Wet Prep: NONE SEEN

## 2011-02-04 LAB — GC/CHLAMYDIA PROBE AMP, GENITAL: GC Probe Amp, Genital: NEGATIVE

## 2011-02-05 LAB — POCT URINALYSIS DIP (DEVICE)
Ketones, ur: >=160 mg/dL — AB
Nitrite: NEGATIVE
Specific Gravity, Urine: 1.02 (ref 1.005–1.030)
pH: 7 (ref 5.0–8.0)

## 2011-02-15 ENCOUNTER — Telehealth: Payer: Self-pay | Admitting: Family Medicine

## 2011-02-15 NOTE — Telephone Encounter (Signed)
Look at patient chart, rx was originally sent to [facility] on [address], patient states she has never used this pharmacy and would like it to be sent to [facility] on [address], rx called in patient informed.

## 2011-02-15 NOTE — Telephone Encounter (Signed)
Pt received letter stating she had a bacterial infection, asking for [place] to send her rx to pharmacy, pt goes to [facility]/[address].

## 2011-09-17 ENCOUNTER — Inpatient Hospital Stay (HOSPITAL_COMMUNITY)
Admission: AD | Admit: 2011-09-17 | Discharge: 2011-09-17 | Disposition: A | Discharge status: Home / Self Care | Payer: BC Managed Care – PPO | Source: Ambulatory Visit | Attending: Family Medicine | Admitting: Family Medicine

## 2011-09-17 ENCOUNTER — Encounter (HOSPITAL_COMMUNITY): Payer: Self-pay | Admitting: *Deleted

## 2011-09-17 ENCOUNTER — Inpatient Hospital Stay (HOSPITAL_COMMUNITY): Payer: BC Managed Care – PPO

## 2011-09-17 DIAGNOSIS — B9689 Other specified bacterial agents as the cause of diseases classified elsewhere: Secondary | ICD-10-CM

## 2011-09-17 DIAGNOSIS — N760 Acute vaginitis: Secondary | ICD-10-CM | POA: Insufficient documentation

## 2011-09-17 DIAGNOSIS — A499 Bacterial infection, unspecified: Secondary | ICD-10-CM

## 2011-09-17 DIAGNOSIS — O2390 Unspecified genitourinary tract infection in pregnancy, unspecified trimester: Secondary | ICD-10-CM | POA: Insufficient documentation

## 2011-09-17 DIAGNOSIS — R109 Unspecified abdominal pain: Secondary | ICD-10-CM | POA: Insufficient documentation

## 2011-09-17 DIAGNOSIS — O26899 Other specified pregnancy related conditions, unspecified trimester: Secondary | ICD-10-CM

## 2011-09-17 HISTORY — DX: Unspecified ovarian cyst, unspecified side: N83.209

## 2011-09-17 LAB — CBC
MCH: 26.7 pg (ref 26.0–34.0)
MCHC: 32.2 g/dL (ref 30.0–36.0)
MCV: 82.9 fL (ref 78.0–100.0)
Platelets: 280 10*3/uL (ref 150–400)

## 2011-09-17 LAB — URINALYSIS, ROUTINE W REFLEX MICROSCOPIC
Glucose, UA: NEGATIVE mg/dL
Leukocytes, UA: NEGATIVE
Nitrite: NEGATIVE
Specific Gravity, Urine: 1.015 (ref 1.005–1.030)
pH: 7 (ref 5.0–8.0)

## 2011-09-17 LAB — URINE MICROSCOPIC-ADD ON

## 2011-09-17 LAB — ABO/RH: ABO/RH(D): O POS

## 2011-09-17 LAB — POCT PREGNANCY, URINE
Preg Test, Ur: POSITIVE
Preg Test, Ur: POSITIVE

## 2011-09-17 MED ORDER — METRONIDAZOLE 500 MG PO TABS: 500.0000 mg | ORAL_TABLET | Freq: Two times a day (BID) | ORAL | Status: AC

## 2011-09-17 NOTE — Progress Notes (Signed)
Took a preg test a couple days ago (+).  Has appt with [facility] on Fri.  Has been having lower abd cramping, really bad since Thurs, has gotten progressively worse.

## 2011-09-17 NOTE — ED Provider Notes (Signed)
History   Pt presents today c/o lower abd pain for the past 4 days. She denies vag bleeding, fever, dysuria. She is @ 4.4wks.  No chief complaint on file.  HPI  OB History    Grav Para Term Preterm Abortions TAB SAB Ect Mult Living   2 1  1      1       Past Medical History  Diagnosis Date  . Hypertension   . Ovarian cyst     Past Surgical History  Procedure Date  . Cesarean section 2000    Family History  Problem Relation Age of Onset  . Kidney disease Father   . Diabetes Father   . Hypertension Maternal Aunt   . Hypertension Maternal Grandmother     History  Substance Use Topics  . Smoking status: Current Everyday Smoker -- 0.5 packs/day    Types: Cigarettes  . Smokeless tobacco: Not on file  . Alcohol Use: No    Allergies: No Known Allergies  Prescriptions prior to admission  Medication Sig Dispense Refill  . lisinopril-hydrochlorothiazide (PRINZIDE,ZESTORETIC) 20-25 MG per tablet TAKE 1 TABLET BY MOUTH EVERY DAY  90 tablet  0    Review of Systems  Constitutional: Negative for fever.  Cardiovascular: Negative for chest pain.  Gastrointestinal: Positive for abdominal pain. Negative for nausea, vomiting, diarrhea and constipation.  Genitourinary: Negative for dysuria, urgency, frequency and hematuria.  Neurological: Negative for dizziness and headaches.  Psychiatric/Behavioral: Negative for depression and suicidal ideas.   Physical Exam   Blood pressure 117/87, pulse 71, temperature 98.8 F (37.1 C), temperature source Oral, resp. rate 18, height 5\' 4"  (1.626 m), weight 178 lb (80.74 kg), last menstrual period 08/16/2011, SpO2 99.00%.  Physical Exam  Nursing note and vitals reviewed. Constitutional: She is oriented to person, place, and time. She appears well-developed and well-nourished. No distress.  HENT:  Head: Normocephalic and atraumatic.  Eyes: EOM are normal. Pupils are equal, round, and reactive to light.  GI: Soft. She exhibits no  distension. There is no tenderness. There is no rebound and no guarding.  Genitourinary: No bleeding around the vagina. Vaginal discharge found.       Cervix Lg/closed. No adnexal masses. Pt nontender on exam. Creamy, white vag dc is present.  Neurological: She is alert and oriented to person, place, and time.  Skin: Skin is warm and dry. She is not diaphoretic.  Psychiatric: She has a normal mood and affect. Her behavior is normal. Judgment and thought content normal.    MAU Course  Procedures  Wet prep and GC/Chlamydia cultures done.  Results for orders placed during the hospital encounter of 09/17/11 (from the past 24 hour(s))  URINALYSIS, ROUTINE W REFLEX MICROSCOPIC     Status: Abnormal   Collection Time   09/17/11  4:55 PM      Component Value Range   Color, Urine YELLOW  YELLOW    Appearance CLEAR  CLEAR    Specific Gravity, Urine 1.015  1.005 - 1.030    pH 7.0  5.0 - 8.0    Glucose, UA NEGATIVE  NEGATIVE (mg/dL)   Hgb urine dipstick NEGATIVE  NEGATIVE    Bilirubin Urine NEGATIVE  NEGATIVE    Ketones, ur 15 (*) NEGATIVE (mg/dL)   Protein, ur 30 (*) NEGATIVE (mg/dL)   Urobilinogen, UA >1.6 (*) 0.0 - 1.0 (mg/dL)   Nitrite NEGATIVE  NEGATIVE    Leukocytes, UA NEGATIVE  NEGATIVE   URINE MICROSCOPIC-ADD ON     Status: Abnormal  Collection Time   09/17/11  4:55 PM      Component Value Range   Squamous Epithelial / LPF MANY (*) RARE    WBC, UA 3-6  <3 (WBC/hpf)   Urine-Other MUCOUS PRESENT    POCT PREGNANCY, URINE     Status: Normal   Collection Time   09/17/11  5:04 PM      Component Value Range   Preg Test, Ur POSITIVE    POCT PREGNANCY, URINE     Status: Normal   Collection Time   09/17/11  5:20 PM      Component Value Range   Preg Test, Ur POSITIVE    HCG, QUANTITATIVE, PREGNANCY     Status: Abnormal   Collection Time   09/17/11  7:20 PM      Component Value Range   hCG, Beta Chain, Quant, S 1932 (*) <5 (mIU/mL)  CBC     Status: Abnormal   Collection Time    09/17/11  7:21 PM      Component Value Range   WBC 11.4 (*) 4.0 - 10.5 (K/uL)   RBC 4.09  3.87 - 5.11 (MIL/uL)   Hemoglobin 10.9 (*) 12.0 - 15.0 (g/dL)   HCT 54.0 (*) 98.1 - 46.0 (%)   MCV 82.9  78.0 - 100.0 (fL)   MCH 26.7  26.0 - 34.0 (pg)   MCHC 32.2  30.0 - 36.0 (g/dL)   RDW 19.1 (*) 47.8 - 15.5 (%)   Platelets 280  150 - 400 (K/uL)  ABO/RH     Status: Normal   Collection Time   09/17/11  7:21 PM      Component Value Range   ABO/RH(D) O POS    WET PREP, GENITAL     Status: Abnormal   Collection Time   09/17/11  9:05 PM      Component Value Range   Yeast, Wet Prep NONE SEEN  NONE SEEN    Trich, Wet Prep NONE SEEN  NONE SEEN    Clue Cells, Wet Prep MODERATE (*) NONE SEEN    WBC, Wet Prep HPF POC FEW (*) NONE SEEN     US Ob Comp Less 14 Wks  09/17/2011  *RADIOLOGY REPORT*  Clinical Data: Pelvic pain.  Pregnant.  OBSTETRIC <14 WK [place] AND TRANSVAGINAL OB US  Technique:  Both transabdominal and transvaginal ultrasound examinations were performed for complete evaluation of the gestation as well as the maternal uterus, adnexal regions, and pelvic cul-de-sac.  Transvaginal technique was performed to assess early pregnancy.  Comparison:  None.  Intrauterine gestational sac:  Visualized/normal in shape. Yolk sac: None Embryo: None Cardiac Activity: N/A Heart Rate: N/A bpm  MSD: 4.8  mm  5    w 0    d CRL:    mm     w     d        [place] EDC: 05/25/2012.  Maternal uterus/adnexae: No subchorionic hemorrhage. Normal right ovary.  Corpus luteum cyst noted. Normal left ovary. No free pelvic fluid collections.  IMPRESSION:  1.  Intrauterine gestational sac estimated at 5 weeks and 0 days gestation.  No embryo or yolk sac. 2.  Normal ovaries.  Original Report Authenticated By: P. Loralie Champagne, M.D.   US Ob Transvaginal  09/17/2011  *RADIOLOGY REPORT*  Clinical Data: Pelvic pain.  Pregnant.  OBSTETRIC <14 WK [place] AND TRANSVAGINAL OB US  Technique:  Both transabdominal and transvaginal ultrasound  examinations were performed for complete evaluation of the gestation as  well as the maternal uterus, adnexal regions, and pelvic cul-de-sac.  Transvaginal technique was performed to assess early pregnancy.  Comparison:  None.  Intrauterine gestational sac:  Visualized/normal in shape. Yolk sac: None Embryo: None Cardiac Activity: N/A Heart Rate: N/A bpm  MSD: 4.8  mm  5    w 0    d CRL:    mm     w     d        [place] EDC: 05/25/2012.  Maternal uterus/adnexae: No subchorionic hemorrhage. Normal right ovary.  Corpus luteum cyst noted. Normal left ovary. No free pelvic fluid collections.  IMPRESSION:  1.  Intrauterine gestational sac estimated at 5 weeks and 0 days gestation.  No embryo or yolk sac. 2.  Normal ovaries.  Original Report Authenticated By: P. Loralie Champagne, M.D.     Assessment and Plan  Abd pain in preg: no yolk sac seen on [place]. Pt will return in 2 days for repeat B-quant. Discussed signs and sx of ectopic preg, however, I suspect she has an early IUP.  BV: will tx with flagyl. Warned of antabuse reaction. Discussed diet, activity, risks,a nd precautions.  Clinton Gallant. Ajani Rineer III, DrHSc, MPAS, PA-C  09/17/2011, 9:29 PM   Henrietta Hoover, PA 09/17/11 2133

## 2011-09-17 NOTE — Progress Notes (Signed)
Patient is here with c/o lower back pain and severe abdominal pain since last friday. Denies any vaginal bleeding or discharge.

## 2011-09-18 LAB — GC/CHLAMYDIA PROBE AMP, GENITAL
Chlamydia, DNA Probe: NEGATIVE
GC Probe Amp, Genital: NEGATIVE

## 2011-09-20 ENCOUNTER — Ambulatory Visit (INDEPENDENT_AMBULATORY_CARE_PROVIDER_SITE_OTHER): Payer: BC Managed Care – PPO | Admitting: Family Medicine

## 2011-09-20 VITALS — BP 130/80 | HR 76 | Temp 98.6°F | Ht 64.0 in | Wt 182.0 lb

## 2011-09-20 DIAGNOSIS — F17200 Nicotine dependence, unspecified, uncomplicated: Secondary | ICD-10-CM

## 2011-09-20 DIAGNOSIS — N760 Acute vaginitis: Secondary | ICD-10-CM

## 2011-09-20 DIAGNOSIS — O0990 Supervision of high risk pregnancy, unspecified, unspecified trimester: Secondary | ICD-10-CM | POA: Insufficient documentation

## 2011-09-20 DIAGNOSIS — I10 Essential (primary) hypertension: Secondary | ICD-10-CM

## 2011-09-20 DIAGNOSIS — Z331 Pregnant state, incidental: Secondary | ICD-10-CM

## 2011-09-20 MED ORDER — PRENATAL 27-0.8 MG PO TABS: 1.0000 | ORAL_TABLET | Freq: Every day | ORAL | Status: DC

## 2011-09-20 NOTE — Patient Instructions (Signed)
Ms. Bonnell,  Thank you for coming in today. We will set you up with a new OB  Visit with me.  In the meantime, take your prenatal vitamin, monitor your blood pressure, quit smoking. Eat well (no raw fish, or uncooked deli meets or cheeses), drink plenty of fluids and rest.    See you soon.   -Dr. Armen Pickup

## 2011-09-21 LAB — OBSTETRIC PANEL
Antibody Screen: NEGATIVE
Basophils Absolute: 0 10*3/uL (ref 0.0–0.1)
Basophils Relative: 0 % (ref 0–1)
HCT: 34.7 % — ABNORMAL LOW (ref 36.0–46.0)
Hemoglobin: 11.1 g/dL — ABNORMAL LOW (ref 12.0–15.0)
Hepatitis B Surface Ag: NEGATIVE
Lymphocytes Relative: 20 % (ref 12–46)
MCHC: 32 g/dL (ref 30.0–36.0)
Monocytes Absolute: 0.8 10*3/uL (ref 0.1–1.0)
Monocytes Relative: 8 % (ref 3–12)
Neutro Abs: 7.6 10*3/uL (ref 1.7–7.7)
Neutrophils Relative %: 71 % (ref 43–77)
RDW: 16.2 % — ABNORMAL HIGH (ref 11.5–15.5)
Rubella: 87.4 IU/mL — ABNORMAL HIGH
WBC: 10.7 10*3/uL — ABNORMAL HIGH (ref 4.0–10.5)

## 2011-09-21 LAB — SICKLE CELL SCREEN: Sickle Cell Screen: NEGATIVE

## 2011-09-22 LAB — CULTURE, OB URINE: Colony Count: 75000

## 2011-09-23 ENCOUNTER — Encounter: Payer: Self-pay | Admitting: Family Medicine

## 2011-09-23 NOTE — Assessment & Plan Note (Signed)
A: BV. P: completing flagyl.

## 2011-09-23 NOTE — Assessment & Plan Note (Signed)
A: normotensive today.  P: monitor BP. Pt to monitor BP at home and bring log. No medication.

## 2011-09-23 NOTE — Progress Notes (Signed)
  Subjective:    Patient ID: Marie Lawrence, female    DOB: 1981-04-29, 30 y.o.   MRN: [id number]  HPI Patient presents for ED f/u. She presented to the ED with abdominal pain and was found to have a positive U preg and a low quantitative HCG. She has a U/S that revealed an IUP. She has an wet prep that was positive for BV. She denies vaginal bleeding. She states that her LMP was 08/16/11. Since her LMP she reports fatigue, moodiness and breast tenderness. She denies N/V, change in appetite.   She is still smoking but cutting back . She has a 44 yo daughter but is excited about this pregnancy. She is in a monogamous relationship with her partner of 6 years.   Since she found out that she is pregnant she has discontinued use of her lisinopril-hctz. She has not yet started taking PNV.   Review of Systems As per HPI    Objective:   Physical Exam General appearance: alert, cooperative and no distress Neck: no adenopathy, no carotid bruit, no JVD, supple, symmetrical, trachea midline and thyroid not enlarged, symmetric, no tenderness/mass/nodules Lungs: clear to auscultation bilaterally Heart: regular rate and rhythm, S1, S2 normal, no murmur, click, rub or gallop Abdomen: soft, non-tender; bowel sounds normal; no masses,  no organomegaly Extremities: extremities normal, atraumatic, no cyanosis or edema Pulses: 2+ and symmetric  Reviewed MAU visit. Quantitative HCG: 1932 (H)     Assessment & Plan:

## 2011-09-23 NOTE — Assessment & Plan Note (Addendum)
A: Desired IUP at [redacted]w[redacted]d today based on LMP.  P:  -check quant HCG for adequate rise -prenatal labs. -start PNV  -pt to f/u for new OB visit

## 2011-09-23 NOTE — Assessment & Plan Note (Addendum)
A: cutting back from 1/2 PPD.  P: f/u cessation.

## 2011-10-22 NOTE — L&D Delivery Note (Signed)
Delivery Note At 8:07 AM a viable female was delivered via Vaginal, Spontaneous Delivery (Presentation: Right Occiput Anterior).  APGAR: 8, 9; weight .   Placenta status: Intact, Spontaneous.  Cord: 3 vessels with the following complications: None.    Anesthesia: Epidural Local: lidocaine Episiotomy: None Lacerations: 2nd degree;Perineal Suture Repair: 3.0 vicryl rapide Est. Blood Loss (mL): 250 mL  Mom to AICU.  Baby to nursery-stable.  Charls Custer JEHIEL 05/16/2012, 8:37 AM

## 2011-10-30 ENCOUNTER — Other Ambulatory Visit: Payer: Self-pay | Admitting: Family Medicine

## 2011-10-30 MED ORDER — CALCIUM POLYCARBOPHIL 625 MG PO TABS: 625.0000 mg | ORAL_TABLET | Freq: Every day | ORAL | Status: DC | PRN

## 2011-11-02 ENCOUNTER — Encounter (HOSPITAL_COMMUNITY): Payer: Self-pay | Admitting: Obstetrics and Gynecology

## 2011-11-02 ENCOUNTER — Inpatient Hospital Stay (HOSPITAL_COMMUNITY)
Admission: AD | Admit: 2011-11-02 | Discharge: 2011-11-02 | Disposition: A | Discharge status: Home / Self Care | Payer: BC Managed Care – PPO | Source: Ambulatory Visit | Attending: Obstetrics & Gynecology | Admitting: Obstetrics & Gynecology

## 2011-11-02 ENCOUNTER — Inpatient Hospital Stay (HOSPITAL_COMMUNITY): Payer: BC Managed Care – PPO

## 2011-11-02 DIAGNOSIS — O209 Hemorrhage in early pregnancy, unspecified: Secondary | ICD-10-CM

## 2011-11-02 LAB — CBC
HCT: 35.3 % — ABNORMAL LOW (ref 36.0–46.0)
MCH: 27.9 pg (ref 26.0–34.0)
Platelets: 276 10*3/uL (ref 150–400)

## 2011-11-02 LAB — WET PREP, GENITAL
Clue Cells Wet Prep HPF POC: NONE SEEN
Trich, Wet Prep: NONE SEEN
Yeast Wet Prep HPF POC: NONE SEEN

## 2011-11-02 NOTE — Progress Notes (Signed)
Pt presents to MAU with chief complaint of vaginal spotting and abdominal discomfort. The spotting started this morning around 0100 and the discomfort started shortly after. Pt is [redacted]w[redacted]d pregnant; G2P1, no history of preterm labor/delivery. Pt notices the spotting on her panties and when she wipes; spotting started as brown in color, now bright red; no odor.

## 2011-11-02 NOTE — ED Provider Notes (Signed)
History   Pt presents today c/o vag bleeding that began last pm. She states she is early pregnant and she noticed some "dark blood" coming from her vagina last night. This morning she noticed a brighter red color when she went to the bathroom. She also reports some abd cramping. She denies vag irritation, dc, fever, or recent intercourse.  Chief Complaint  Patient presents with  . Vaginal Bleeding  . Abdominal Cramping   HPI  OB History    Grav Para Term Preterm Abortions TAB SAB Ect Mult Living   2 1 0 1 0 0 0 0 0 1       Past Medical History  Diagnosis Date  . Hypertension   . Ovarian cyst     Past Surgical History  Procedure Date  . Cesarean section 2000    Family History  Problem Relation Age of Onset  . Kidney disease Father   . Diabetes Father   . Hypertension Maternal Aunt   . Hypertension Maternal Grandmother     History  Substance Use Topics  . Smoking status: Current Everyday Smoker -- 0.5 packs/day    Types: Cigarettes  . Smokeless tobacco: Not on file  . Alcohol Use: No    Allergies: No Known Allergies  Prescriptions prior to admission  Medication Sig Dispense Refill  . Prenatal Vit-Fe Fumarate-FA (PRENATAL MULTIVITAMIN) TABS Take 1 tablet by mouth daily.      . polycarbophil (FIBERCON) 625 MG tablet Take 1 tablet (625 mg total) by mouth daily as needed (constipation).  90 tablet  3  . DISCONTD: metroNIDAZOLE (FLAGYL) 500 MG tablet         Review of Systems  Constitutional: Negative for fever.  Eyes: Negative for blurred vision.  Cardiovascular: Negative for chest pain and palpitations.  Gastrointestinal: Positive for abdominal pain. Negative for nausea, vomiting, diarrhea and constipation.  Genitourinary: Negative for dysuria, urgency, frequency and hematuria.  Neurological: Negative for dizziness and headaches.  Psychiatric/Behavioral: Negative for depression and suicidal ideas.   Physical Exam   Blood pressure 132/81, pulse 90, temperature  99.3 F (37.4 C), temperature source Oral, resp. rate 16, height 5\' 5"  (1.651 m), weight 184 lb 6.4 oz (83.643 kg), last menstrual period 08/16/2011.  Physical Exam  Nursing note and vitals reviewed. Constitutional: She is oriented to person, place, and time. She appears well-developed and well-nourished. No distress.  HENT:  Head: Normocephalic and atraumatic.  Eyes: EOM are normal. Pupils are equal, round, and reactive to light.  GI: Soft. She exhibits no distension. There is no tenderness. There is no rebound and no guarding.  Genitourinary: There is bleeding around the vagina. Vaginal discharge found.       Cervix Lg/closed. Uterus 12wks size. No adnexal masses. Minimal amount of blood in the vag vault.  Neurological: She is alert and oriented to person, place, and time.  Skin: Skin is warm and dry. She is not diaphoretic.  Psychiatric: She has a normal mood and affect. Her behavior is normal. Judgment and thought content normal.    MAU Course  Procedures  Wet prep and GC/Chlamydia culture done.  Results for orders placed during the hospital encounter of 11/02/11 (from the past 72 hour(s))  WET PREP, GENITAL     Status: Abnormal   Collection Time   11/02/11 12:50 PM      Component Value Range Comment   Yeast, Wet Prep NONE SEEN  NONE SEEN     Trich, Wet Prep NONE SEEN  NONE SEEN  Clue Cells, Wet Prep NONE SEEN  NONE SEEN     WBC, Wet Prep HPF POC FEW (*) NONE SEEN  MODERATE BACTERIA SEEN  CBC     Status: Abnormal   Collection Time   11/02/11 12:56 PM      Component Value Range Comment   WBC 8.2  4.0 - 10.5 (K/uL)    RBC 4.20  3.87 - 5.11 (MIL/uL)    Hemoglobin 11.7 (*) 12.0 - 15.0 (g/dL)    HCT 56.2 (*) 13.0 - 46.0 (%)    MCV 84.0  78.0 - 100.0 (fL)    MCH 27.9  26.0 - 34.0 (pg)    MCHC 33.1  30.0 - 36.0 (g/dL)    RDW 86.5  78.4 - 69.6 (%)    Platelets 276  150 - 400 (K/uL)    US shows single IUP consistent with EGA. Small subchorionic hemorrhage noted.  Assessment  and Plan  Bleeding in preg: discussed with pt at length. She will f/u with her OB provider. Discussed diet, activity, risks, and precautions.  Clinton Gallant. Lashaundra Lehrmann III, DrHSc, MPAS, PA-C  11/02/2011, 12:47 PM   Henrietta Hoover, PA 11/02/11 1324

## 2011-11-02 NOTE — Progress Notes (Signed)
Onset of brown vaginal bleeding last night, this morning was red mainly on toilet tissue, mild cramping 11 weeks

## 2011-11-04 LAB — GC/CHLAMYDIA PROBE AMP, GENITAL
Chlamydia, DNA Probe: NEGATIVE
GC Probe Amp, Genital: NEGATIVE

## 2011-11-06 ENCOUNTER — Encounter: Payer: Self-pay | Admitting: Family Medicine

## 2011-11-06 ENCOUNTER — Encounter: Payer: BC Managed Care – PPO | Admitting: Family Medicine

## 2011-11-13 ENCOUNTER — Ambulatory Visit (INDEPENDENT_AMBULATORY_CARE_PROVIDER_SITE_OTHER): Payer: BC Managed Care – PPO | Admitting: Family Medicine

## 2011-11-13 ENCOUNTER — Encounter: Payer: Self-pay | Admitting: Family Medicine

## 2011-11-13 VITALS — BP 134/83 | Temp 98.2°F | Wt 190.0 lb

## 2011-11-13 DIAGNOSIS — I10 Essential (primary) hypertension: Secondary | ICD-10-CM

## 2011-11-13 DIAGNOSIS — Z331 Pregnant state, incidental: Secondary | ICD-10-CM

## 2011-11-13 DIAGNOSIS — F17200 Nicotine dependence, unspecified, uncomplicated: Secondary | ICD-10-CM

## 2011-11-14 ENCOUNTER — Encounter: Payer: Self-pay | Admitting: Family Medicine

## 2011-11-14 NOTE — Assessment & Plan Note (Signed)
A: remains normotensive off medication.  P:  -referral to hight risk clinic.  -continue to monitor BPs closely.

## 2011-11-14 NOTE — Assessment & Plan Note (Signed)
A: has cut back to 3-4 per day for I/1 PPD. Patient declines nicotine supplementation at this time, P: recommend behavior modification (chewing gum/knitting) and partnering with FOB for smoking cessation.

## 2011-11-14 NOTE — Assessment & Plan Note (Addendum)
A: Positive IUP at [redacted]w[redacted]d complicated for T1 use of lisinopril-HCTZ for chronic HTN. HTN has been controlled without medications since patient found out she was pregnant at [redacted] weeks. Also complicated by small subchorionic hemorrhage and scant painless vaginal bleeding.    P: -pt to f/u with me for pelvic exam, due for pap. -referral to high risk clinic for evaluation. - -integrated screen.

## 2011-11-14 NOTE — Progress Notes (Signed)
  Subjective:    Marie Lawrence is a 58 [redacted]w[redacted]d being seen today for her first obstetrical visit.  Her obstetrical history is significant for pregnancy induced hypertension, smoker and prior late preterm delivery  in the setting of PIH, C-section delivery, chronic hypertension (treated with lisinopril-HCTZ prior to confirming pregnancy) and painless vaginal bleeding due to subchorionic hemorrhage. Pregnancy history fully reviewed. She would like a trial of labor with this current pregnancy.   Patient reports bleeding. Evaluated at [facility] about 2 weeks ago. Diagnosed with subchorionic hemorrhage. Positive IUP. Fetal cardiac activity noted. Placenta location not noted. Patient's vaginal bleeding resolved. This morning she reports scant pink vaginal bleeding. She denies pain or contractions. She last had sexual intercourse one week ago.   Patient is still smoking. She has cut down from 1/2 PPD to 3-4 per day. Her boyfriend and FOB is also a smoker.  Filed Vitals:   11/13/11 1502  BP: 134/83  Temp: 98.2 F (36.8 C)  Weight: 190 lb (86.183 kg)    HISTORY: OB History    Grav Para Term Preterm Abortions TAB SAB Ect Mult Living   2 1 0 1 0 0 0 0 0 1      # Outc Date GA Lbr Len/2nd Wgt Sex Del Anes PTL Lv   1 PRE 2000 [redacted]w[redacted]d   F LTCS EPI  Yes   2 CUR              Past Medical History  Diagnosis Date  . Hypertension   . Ovarian cyst    Past Surgical History  Procedure Date  . Cesarean section 2000   Family History  Problem Relation Age of Onset  . Kidney disease Father   . Diabetes Father   . Hypertension Maternal Aunt   . Hypertension Maternal Grandmother   . Anesthesia problems Neg Hx   . Hypotension Neg Hx   . Malignant hyperthermia Neg Hx   . Pseudochol deficiency Neg Hx    Exam  General appearance: alert and cooperative Head: Normocephalic, without obvious abnormality, atraumatic Eyes: conjunctivae/corneas clear. PERRL, EOM's intact. Fundi benign. Throat: lips, mucosa, and  tongue normal; teeth and gums normal Neck:  no JVD, supple, symmetrical, trachea midline and thyroid not enlarged, symmetric, no tenderness/mass/nodules Lungs: clear to auscultation bilaterally Heart: regular rate and rhythm, S1, S2 normal, no murmur, click, rub or gallop Abdomen: soft, non-tender; bowel sounds normal; no masses,  no organomegaly Pelvic: deferred Extremities: extremities normal, atraumatic, no cyanosis or edema Pulses: 2+ and symmetric Skin: Skin color, texture, turgor normal. No rashes or lesions  Bedside sono: IUP. Positive fetal movement noted. Cardiac activity noted.  Assessment:    Pregnancy: G2P0101 Patient Active Problem List  Diagnoses  . TOBACCO DEPENDENCE  . HYPERTENSION  . Pregnancy, supervision for, high-risk        Plan:     Initial labs drawn. Prenatal vitamins. Problem list reviewed and updated. Genetic Screening discussed Integrated Screen: requested. Ultrasound discussed; fetal survey: results reviewed. Follow up in 2 days for pap smear and BP check. Referral to high risk OB clinic.  Dessa Phi 11/14/2011

## 2011-11-15 ENCOUNTER — Ambulatory Visit: Payer: BC Managed Care – PPO | Admitting: Family Medicine

## 2011-11-19 ENCOUNTER — Ambulatory Visit (INDEPENDENT_AMBULATORY_CARE_PROVIDER_SITE_OTHER): Payer: BC Managed Care – PPO | Admitting: Family Medicine

## 2011-11-19 ENCOUNTER — Other Ambulatory Visit (HOSPITAL_COMMUNITY)
Admission: RE | Admit: 2011-11-19 | Discharge: 2011-11-19 | Disposition: A | Discharge status: Home / Self Care | Payer: BC Managed Care – PPO | Source: Ambulatory Visit | Attending: Family Medicine | Admitting: Family Medicine

## 2011-11-19 VITALS — BP 133/84 | Wt 186.0 lb

## 2011-11-19 DIAGNOSIS — Z124 Encounter for screening for malignant neoplasm of cervix: Secondary | ICD-10-CM

## 2011-11-19 DIAGNOSIS — O0990 Supervision of high risk pregnancy, unspecified, unspecified trimester: Secondary | ICD-10-CM

## 2011-11-19 DIAGNOSIS — Z01419 Encounter for gynecological examination (general) (routine) without abnormal findings: Secondary | ICD-10-CM | POA: Insufficient documentation

## 2011-11-19 NOTE — Progress Notes (Signed)
S: 31 yo G2P0101 at [redacted]w[redacted]d returns for f/u OB visit and pap smear. She denies vaginal bleeding. She is taking her prenatal vitamins. She still smoking but is down to 2 cigarettes a day.  O: Blood pressure 130/84, see flow sheet. GU: Speculum exam refills thin white vaginal discharge. There is no vaginal bleeding or clear fluid. The cervix is visualized and is long and closed. Digital exam cervix long and closed. No cervical motion tenderness. Uterus is gravid. Pap smear done.   A/P: 1. Pregnancy complicated by first trimester bleeding due to subchorionic hemorrhage, chronic HTN (early first trimester lisinopril use) and history of preterm delivery. Initial OB intake done. Will refer to [facility] for consultation.  2. HTN: BP well controlled w/o treatment. Continue to monitor.  3. Smoking: Improved. Congratulate and encourage continued progress toward cessation.   4. Genetic screening: Unable to schedule integrated screen as she will be outside of the window at the earliest available appointment. Will order quad screen. 5. followup with me in 4 weeks.

## 2011-11-19 NOTE — Patient Instructions (Signed)
Ms. Egli,  Thank you for coming in today. My nurses will contact you about your genetic screening and your referral to the high risk OB clinic at [facility]. Please continue to take your prenatal vitamin. Please continue your progress with smoking cessation.  Plan to see me in 4 weeks. You should have a visit with the high risk clinic before then.  -Dr. Armen Pickup

## 2011-11-25 ENCOUNTER — Ambulatory Visit (INDEPENDENT_AMBULATORY_CARE_PROVIDER_SITE_OTHER): Payer: BC Managed Care – PPO | Admitting: Family Medicine

## 2011-11-25 DIAGNOSIS — I10 Essential (primary) hypertension: Secondary | ICD-10-CM

## 2011-11-25 DIAGNOSIS — O0990 Supervision of high risk pregnancy, unspecified, unspecified trimester: Secondary | ICD-10-CM

## 2011-11-25 DIAGNOSIS — Z9889 Other specified postprocedural states: Secondary | ICD-10-CM

## 2011-11-25 DIAGNOSIS — O34219 Maternal care for unspecified type scar from previous cesarean delivery: Secondary | ICD-10-CM | POA: Insufficient documentation

## 2011-11-25 DIAGNOSIS — Z98891 History of uterine scar from previous surgery: Secondary | ICD-10-CM

## 2011-11-25 LAB — POCT URINALYSIS DIP (DEVICE)
Hgb urine dipstick: NEGATIVE
Ketones, ur: NEGATIVE mg/dL
Protein, ur: NEGATIVE mg/dL
Specific Gravity, Urine: 1.02 (ref 1.005–1.030)

## 2011-11-25 NOTE — Progress Notes (Signed)
Vaginal d/c as thick white. No odor, no itch.

## 2011-11-25 NOTE — Patient Instructions (Signed)
24-Hour Urine Collection HOME CARE  When you get up in the morning on the day you do this test, pee (urinate) in the toilet and flush. Make a note of the time. This will be your start time on the day of collection and the end time on the next morning.   From then on, save all your pee (urine) in the plastic jug that was given to you.   You should stop collecting your pee 24 hours after you started.   If the plastic jug that is given to you already has liquid in it, that is okay. Do not throw out the liquid or rinse out the jug. Some tests need the liquid to be added to your pee.   Keep your plastic jug cool (in an ice chest or the refrigerator) during the test.   When the 24 hours is over, bring your plastic jug to the clinic lab. Keep the jug cool (in an ice chest) while you are bringing it to the lab.  Document Released: 01/03/2009 Document Revised: 06/19/2011 Document Reviewed: 01/03/2009 [employer] Patient Information 2012 [employer]. 

## 2011-11-25 NOTE — Progress Notes (Signed)
   Subjective:    Marie Lawrence is a [id number] [redacted]w[redacted]d being seen today for her first obstetrical visit.  Her obstetrical history is significant for chronic hypertension, history of severe hypertension in pregnancy.   Patient reports no complaints.  Filed Vitals:   11/25/11 0905  BP: 129/88  Pulse: 78  Temp: 98.4 F (36.9 C)  Weight: 185 lb 12.8 oz (84.278 kg)    HISTORY: OB History    Grav Para Term Preterm Abortions TAB SAB Ect Mult Living   2 1 0 1 0 0 0 0 0 1      # Outc Date GA Lbr Len/2nd Wgt Sex Del Anes PTL Lv   1 PRE 2000 [redacted]w[redacted]d   F LTCS EPI  Yes   2 CUR              Past Medical History  Diagnosis Date  . Hypertension   . Ovarian cyst    Past Surgical History  Procedure Date  . Cesarean section 2000   Family History  Problem Relation Age of Onset  . Kidney disease Father   . Diabetes Father   . Hypertension Maternal Aunt   . Hypertension Maternal Grandmother   . Anesthesia problems Neg Hx   . Hypotension Neg Hx   . Malignant hyperthermia Neg Hx   . Pseudochol deficiency Neg Hx      Exam    Uterine Size: 4 fb below umbilicus   Skin: normal coloration and turgor, no rashes    Neurologic: oriented, normal, no focal deficits   Extremities: normal strength, tone, and muscle mass   HEENT PERRLA   Mouth/Teeth mucous membranes moist, pharynx normal without lesions   Neck supple   Cardiovascular: regular rate and rhythm   Respiratory:  appears well, vitals normal, no respiratory distress, acyanotic, normal RR, ear and throat exam is normal, neck free of mass or lymphadenopathy, chest clear, no wheezing, crepitations, rhonchi, normal symmetric air entry   Abdomen: soft, non-tender; bowel sounds normal; no masses,  no organomegaly      Assessment:    Pregnancy: [id number] Patient Active Problem List  Diagnoses  . TOBACCO DEPENDENCE  . HYPERTENSION  . Pregnancy, supervision for, high-risk  . Previous cesarean section        Plan:     Problem  list reviewed and updated. Genetic Screening discussed Quad Screen: requested.  Ultrasound discussed; fetal survey: requested.  Follow up in 3 weeks. 50% of 30 min visit spent on counseling and coordination of care.  Patient would like repeat c-section.  Unsure of BTL at that time.   Marie Lawrence 11/25/2011

## 2011-11-26 LAB — COMPREHENSIVE METABOLIC PANEL
Albumin: 3.8 g/dL (ref 3.5–5.2)
CO2: 24 mEq/L (ref 19–32)
Calcium: 8.9 mg/dL (ref 8.4–10.5)
Glucose, Bld: 79 mg/dL (ref 70–99)
Potassium: 3.4 mEq/L — ABNORMAL LOW (ref 3.5–5.3)
Sodium: 138 mEq/L (ref 135–145)
Total Protein: 6.6 g/dL (ref 6.0–8.3)

## 2011-11-26 LAB — CBC
Hemoglobin: 11 g/dL — ABNORMAL LOW (ref 12.0–15.0)
MCH: 27.8 pg (ref 26.0–34.0)
RBC: 3.95 MIL/uL (ref 3.87–5.11)

## 2011-11-26 NOTE — Progress Notes (Signed)
Addended by: Darrel Hoover on: 11/26/2011 10:40 AM   Modules accepted: Orders

## 2011-11-27 LAB — CREATININE CLEARANCE, URINE, 24 HOUR
Creatinine, Urine: 149.4 mg/dL
Creatinine: 0.5 mg/dL (ref 0.50–1.10)

## 2011-11-27 LAB — PROTEIN, URINE, 24 HOUR
Protein, 24H Urine: 70 mg/d (ref 50–100)
Protein, Urine: 7 mg/dL

## 2011-12-11 ENCOUNTER — Encounter: Payer: BC Managed Care – PPO | Admitting: Family Medicine

## 2011-12-16 ENCOUNTER — Encounter: Payer: BC Managed Care – PPO | Admitting: Obstetrics & Gynecology

## 2011-12-16 ENCOUNTER — Ambulatory Visit (INDEPENDENT_AMBULATORY_CARE_PROVIDER_SITE_OTHER): Payer: BC Managed Care – PPO | Admitting: Family

## 2011-12-16 VITALS — BP 127/85 | Temp 97.1°F | Wt 188.2 lb

## 2011-12-16 DIAGNOSIS — I10 Essential (primary) hypertension: Secondary | ICD-10-CM

## 2011-12-16 DIAGNOSIS — O34219 Maternal care for unspecified type scar from previous cesarean delivery: Secondary | ICD-10-CM

## 2011-12-16 DIAGNOSIS — O169 Unspecified maternal hypertension, unspecified trimester: Secondary | ICD-10-CM

## 2011-12-16 LAB — POCT URINALYSIS DIP (DEVICE)
Hgb urine dipstick: NEGATIVE
Nitrite: NEGATIVE
Urobilinogen, UA: 1 mg/dL (ref 0.0–1.0)
pH: 7.5 (ref 5.0–8.0)

## 2011-12-16 NOTE — Progress Notes (Signed)
Reviewed all labs with patient; reports intermittent headaches>advised tylenol; no meds needed at this time for hypertension; schedule anatomy ultrasound and obtain quad screen today.

## 2011-12-25 ENCOUNTER — Encounter: Payer: Self-pay | Admitting: *Deleted

## 2012-01-06 ENCOUNTER — Ambulatory Visit (INDEPENDENT_AMBULATORY_CARE_PROVIDER_SITE_OTHER): Payer: BC Managed Care – PPO | Admitting: Obstetrics and Gynecology

## 2012-01-06 VITALS — BP 132/93 | Temp 97.4°F | Wt 189.7 lb

## 2012-01-06 DIAGNOSIS — I10 Essential (primary) hypertension: Secondary | ICD-10-CM

## 2012-01-06 DIAGNOSIS — Z23 Encounter for immunization: Secondary | ICD-10-CM

## 2012-01-06 DIAGNOSIS — Z98891 History of uterine scar from previous surgery: Secondary | ICD-10-CM

## 2012-01-06 DIAGNOSIS — O34219 Maternal care for unspecified type scar from previous cesarean delivery: Secondary | ICD-10-CM

## 2012-01-06 DIAGNOSIS — O169 Unspecified maternal hypertension, unspecified trimester: Secondary | ICD-10-CM

## 2012-01-06 DIAGNOSIS — O0990 Supervision of high risk pregnancy, unspecified, unspecified trimester: Secondary | ICD-10-CM

## 2012-01-06 LAB — POCT URINALYSIS DIP (DEVICE)
Ketones, ur: NEGATIVE mg/dL
Leukocytes, UA: NEGATIVE
Nitrite: NEGATIVE
Protein, ur: NEGATIVE mg/dL
pH: 7.5 (ref 5.0–8.0)

## 2012-01-06 MED ORDER — TETANUS-DIPHTH-ACELL PERTUSSIS 5-2.5-18.5 LF-MCG/0.5 IM SUSP
0.5000 mL | Freq: Once | INTRAMUSCULAR | Status: AC
  Administered 2012-01-06: 0.5 mL via INTRAMUSCULAR

## 2012-01-06 NOTE — Progress Notes (Signed)
Edema-feet and ankles.  Pulse 83. Pt signed consent for Tdap vaccine.

## 2012-01-06 NOTE — Progress Notes (Signed)
Patient doing well without complaints. Will schedule anatomy ultrasound today. BP borderline this week- will have patient return in 2 weeks.

## 2012-01-10 ENCOUNTER — Encounter: Payer: Self-pay | Admitting: *Deleted

## 2012-01-10 DIAGNOSIS — O10019 Pre-existing essential hypertension complicating pregnancy, unspecified trimester: Secondary | ICD-10-CM | POA: Insufficient documentation

## 2012-01-13 ENCOUNTER — Ambulatory Visit (HOSPITAL_COMMUNITY)
Admission: RE | Admit: 2012-01-13 | Discharge: 2012-01-13 | Disposition: A | Discharge status: Home / Self Care | Payer: BC Managed Care – PPO | Source: Ambulatory Visit | Attending: Obstetrics and Gynecology | Admitting: Obstetrics and Gynecology

## 2012-01-13 ENCOUNTER — Ambulatory Visit (INDEPENDENT_AMBULATORY_CARE_PROVIDER_SITE_OTHER): Payer: BC Managed Care – PPO | Admitting: Family

## 2012-01-13 VITALS — BP 143/80 | Temp 97.9°F | Wt 191.9 lb

## 2012-01-13 DIAGNOSIS — O169 Unspecified maternal hypertension, unspecified trimester: Secondary | ICD-10-CM

## 2012-01-13 DIAGNOSIS — O10019 Pre-existing essential hypertension complicating pregnancy, unspecified trimester: Secondary | ICD-10-CM | POA: Insufficient documentation

## 2012-01-13 DIAGNOSIS — Z363 Encounter for antenatal screening for malformations: Secondary | ICD-10-CM | POA: Insufficient documentation

## 2012-01-13 DIAGNOSIS — Z1389 Encounter for screening for other disorder: Secondary | ICD-10-CM | POA: Insufficient documentation

## 2012-01-13 DIAGNOSIS — O0990 Supervision of high risk pregnancy, unspecified, unspecified trimester: Secondary | ICD-10-CM

## 2012-01-13 DIAGNOSIS — I10 Essential (primary) hypertension: Secondary | ICD-10-CM

## 2012-01-13 DIAGNOSIS — O358XX0 Maternal care for other (suspected) fetal abnormality and damage, not applicable or unspecified: Secondary | ICD-10-CM | POA: Insufficient documentation

## 2012-01-13 LAB — POCT URINALYSIS DIP (DEVICE)
Protein, ur: NEGATIVE mg/dL
Specific Gravity, Urine: 1.015 (ref 1.005–1.030)
Urobilinogen, UA: 2 mg/dL — ABNORMAL HIGH (ref 0.0–1.0)

## 2012-01-13 NOTE — Progress Notes (Signed)
No problems or concerns; consulted with Dr. Glynis Smiles with no meds; ultrasound completed today (not resulted)

## 2012-01-13 NOTE — Progress Notes (Signed)
Edema-feet and hands. No vaginal discharge. Pulse 69.

## 2012-01-14 ENCOUNTER — Encounter: Payer: Self-pay | Admitting: Obstetrics and Gynecology

## 2012-01-20 ENCOUNTER — Ambulatory Visit (INDEPENDENT_AMBULATORY_CARE_PROVIDER_SITE_OTHER): Payer: BC Managed Care – PPO | Admitting: Family Medicine

## 2012-01-20 VITALS — BP 138/89 | Temp 97.7°F | Wt 189.5 lb

## 2012-01-20 DIAGNOSIS — F17200 Nicotine dependence, unspecified, uncomplicated: Secondary | ICD-10-CM

## 2012-01-20 DIAGNOSIS — O0990 Supervision of high risk pregnancy, unspecified, unspecified trimester: Secondary | ICD-10-CM

## 2012-01-20 DIAGNOSIS — O169 Unspecified maternal hypertension, unspecified trimester: Secondary | ICD-10-CM

## 2012-01-20 LAB — POCT URINALYSIS DIP (DEVICE)
Glucose, UA: NEGATIVE mg/dL
Hgb urine dipstick: NEGATIVE
Nitrite: NEGATIVE
Protein, ur: 30 mg/dL — AB
Urobilinogen, UA: 1 mg/dL (ref 0.0–1.0)

## 2012-01-20 NOTE — Progress Notes (Signed)
Edema-feet and hands. No vaginal discharge.

## 2012-01-20 NOTE — Patient Instructions (Signed)
Pregnancy - Second Trimester The second trimester of pregnancy (3 to 6 months) is a period of rapid growth for you and your baby. At the end of the sixth month, your baby is about 9 inches long and weighs 1 1/2 pounds. You will begin to feel the baby move between 18 and 20 weeks of the pregnancy. This is called quickening. Weight gain is faster. A clear fluid (colostrum) may leak out of your breasts. You may feel small contractions of the womb (uterus). This is known as false labor or Braxton-Hicks contractions. This is like a practice for labor when the baby is ready to be born. Usually, the problems with morning sickness have usually passed by the end of your first trimester. Some women develop small dark blotches (called cholasma, mask of pregnancy) on their face that usually goes away after the baby is born. Exposure to the sun makes the blotches worse. Acne may also develop in some pregnant women and pregnant women who have acne, may find that it goes away. PRENATAL EXAMS  Blood work may continue to be done during prenatal exams. These tests are done to check on your health and the probable health of your baby. Blood work is used to follow your blood levels (hemoglobin). Anemia (low hemoglobin) is common during pregnancy. Iron and vitamins are given to help prevent this. You will also be checked for diabetes between 24 and 28 weeks of the pregnancy. Some of the previous blood tests may be repeated.   The size of the uterus is measured during each visit. This is to make sure that the baby is continuing to grow properly according to the dates of the pregnancy.   Your blood pressure is checked every prenatal visit. This is to make sure you are not getting toxemia.   Your urine is checked to make sure you do not have an infection, diabetes or protein in the urine.   Your weight is checked often to make sure gains are happening at the suggested rate. This is to ensure that both you and your baby are  growing normally.   Sometimes, an ultrasound is performed to confirm the proper growth and development of the baby. This is a test which bounces harmless sound waves off the baby so your caregiver can more accurately determine due dates.  Sometimes, a specialized test is done on the amniotic fluid surrounding the baby. This test is called an amniocentesis. The amniotic fluid is obtained by sticking a needle into the belly (abdomen). This is done to check the chromosomes in instances where there is a concern about possible genetic problems with the baby. It is also sometimes done near the end of pregnancy if an early delivery is required. In this case, it is done to help make sure the baby's lungs are mature enough for the baby to live outside of the womb. CHANGES OCCURING IN THE SECOND TRIMESTER OF PREGNANCY Your body goes through many changes during pregnancy. They vary from person to person. Talk to your caregiver about changes you notice that you are concerned about.  During the second trimester, you will likely have an increase in your appetite. It is normal to have cravings for certain foods. This varies from person to person and pregnancy to pregnancy.   Your lower abdomen will begin to bulge.   You may have to urinate more often because the uterus and baby are pressing on your bladder. It is also common to get more bladder infections during pregnancy (  pain with urination). You can help this by drinking lots of fluids and emptying your bladder before and after intercourse.   You may begin to get stretch marks on your hips, abdomen, and breasts. These are normal changes in the body during pregnancy. There are no exercises or medications to take that prevent this change.   You may begin to develop swollen and bulging veins (varicose veins) in your legs. Wearing support hose, elevating your feet for 15 minutes, 3 to 4 times a day and limiting salt in your diet helps lessen the problem.    Heartburn may develop as the uterus grows and pushes up against the stomach. Antacids recommended by your caregiver helps with this problem. Also, eating smaller meals 4 to 5 times a day helps.   Constipation can be treated with a stool softener or adding bulk to your diet. Drinking lots of fluids, vegetables, fruits, and whole grains are helpful.   Exercising is also helpful. If you have been very active up until your pregnancy, most of these activities can be continued during your pregnancy. If you have been less active, it is helpful to start an exercise program such as walking.   Hemorrhoids (varicose veins in the rectum) may develop at the end of the second trimester. Warm sitz baths and hemorrhoid cream recommended by your caregiver helps hemorrhoid problems.   Backaches may develop during this time of your pregnancy. Avoid heavy lifting, wear low heal shoes and practice good posture to help with backache problems.   Some pregnant women develop tingling and numbness of their hand and fingers because of swelling and tightening of ligaments in the wrist (carpel tunnel syndrome). This goes away after the baby is born.   As your breasts enlarge, you may have to get a bigger bra. Get a comfortable, cotton, support bra. Do not get a nursing bra until the last month of the pregnancy if you will be nursing the baby.   You may get a dark line from your belly button to the pubic area called the linea nigra.   You may develop rosy cheeks because of increase blood flow to the face.   You may develop spider looking lines of the face, neck, arms and chest. These go away after the baby is born.  HOME CARE INSTRUCTIONS   It is extremely important to avoid all smoking, herbs, alcohol, and unprescribed drugs during your pregnancy. These chemicals affect the formation and growth of the baby. Avoid these chemicals throughout the pregnancy to ensure the delivery of a healthy infant.   Most of your home  care instructions are the same as suggested for the first trimester of your pregnancy. Keep your caregiver's appointments. Follow your caregiver's instructions regarding medication use, exercise and diet.   During pregnancy, you are providing food for you and your baby. Continue to eat regular, well-balanced meals. Choose foods such as meat, fish, milk and other low fat dairy products, vegetables, fruits, and whole-grain breads and cereals. Your caregiver will tell you of the ideal weight gain.   A physical sexual relationship may be continued up until near the end of pregnancy if there are no other problems. Problems could include early (premature) leaking of amniotic fluid from the membranes, vaginal bleeding, abdominal pain, or other medical or pregnancy problems.   Exercise regularly if there are no restrictions. Check with your caregiver if you are unsure of the safety of some of your exercises. The greatest weight gain will occur in the   last 2 trimesters of pregnancy. Exercise will help you:   Control your weight.   Get you in shape for labor and delivery.   Lose weight after you have the baby.   Wear a good support or jogging bra for breast tenderness during pregnancy. This may help if worn during sleep. Pads or tissues may be used in the bra if you are leaking colostrum.   Do not use hot tubs, steam rooms or saunas throughout the pregnancy.   Wear your seat belt at all times when driving. This protects you and your baby if you are in an accident.   Avoid raw meat, uncooked cheese, cat litter boxes and soil used by cats. These carry germs that can cause birth defects in the baby.   The second trimester is also a good time to visit your dentist for your dental health if this has not been done yet. Getting your teeth cleaned is OK. Use a soft toothbrush. Brush gently during pregnancy.   It is easier to loose urine during pregnancy. Tightening up and strengthening the pelvic muscles will  help with this problem. Practice stopping your urination while you are going to the bathroom. These are the same muscles you need to strengthen. It is also the muscles you would use as if you were trying to stop from passing gas. You can practice tightening these muscles up 10 times a set and repeating this about 3 times per day. Once you know what muscles to tighten up, do not perform these exercises during urination. It is more likely to contribute to an infection by backing up the urine.   Ask for help if you have financial, counseling or nutritional needs during pregnancy. Your caregiver will be able to offer counseling for these needs as well as refer you for other special needs.   Your skin may become oily. If so, wash your face with mild soap, use non-greasy moisturizer and oil or cream based makeup.  MEDICATIONS AND DRUG USE IN PREGNANCY  Take prenatal vitamins as directed. The vitamin should contain 1 milligram of folic acid. Keep all vitamins out of reach of children. Only a couple vitamins or tablets containing iron may be fatal to a baby or young child when ingested.   Avoid use of all medications, including herbs, over-the-counter medications, not prescribed or suggested by your caregiver. Only take over-the-counter or prescription medicines for pain, discomfort, or fever as directed by your caregiver. Do not use aspirin.   Let your caregiver also know about herbs you may be using.   Alcohol is related to a number of birth defects. This includes fetal alcohol syndrome. All alcohol, in any form, should be avoided completely. Smoking will cause low birth rate and premature babies.   Street or illegal drugs are very harmful to the baby. They are absolutely forbidden. A baby born to an addicted mother will be addicted at birth. The baby will go through the same withdrawal an adult does.  SEEK MEDICAL CARE IF:  You have any concerns or worries during your pregnancy. It is better to call with  your questions if you feel they cannot wait, rather than worry about them. SEEK IMMEDIATE MEDICAL CARE IF:   An unexplained oral temperature above 102 F (38.9 C) develops, or as your caregiver suggests.   You have leaking of fluid from the vagina (birth canal). If leaking membranes are suspected, take your temperature and tell your caregiver of this when you call.   There   is vaginal spotting, bleeding, or passing clots. Tell your caregiver of the amount and how many pads are used. Light spotting in pregnancy is common, especially following intercourse.   You develop a bad smelling vaginal discharge with a change in the color from clear to white.   You continue to feel sick to your stomach (nauseated) and have no relief from remedies suggested. You vomit blood or coffee ground-like materials.   You lose more than 2 pounds of weight or gain more than 2 pounds of weight over 1 week, or as suggested by your caregiver.   You notice swelling of your face, hands, feet, or legs.   You get exposed to German measles and have never had them.   You are exposed to fifth disease or chickenpox.   You develop belly (abdominal) pain. Round ligament discomfort is a common non-cancerous (benign) cause of abdominal pain in pregnancy. Your caregiver still must evaluate you.   You develop a bad headache that does not go away.   You develop fever, diarrhea, pain with urination, or shortness of breath.   You develop visual problems, blurry, or double vision.   You fall or are in a car accident or any kind of trauma.   There is mental or physical violence at home.  Document Released: 10/01/2001 Document Revised: 09/26/2011 Document Reviewed: 04/05/2009 [employer] Patient Information 2012 [employer]. Contraception Choices Contraception (birth control) is the use of any methods or devices to prevent pregnancy. Below are some methods to help avoid pregnancy. HORMONAL METHODS   Contraceptive implant.  This is a thin, plastic tube containing progesterone hormone. It does not contain estrogen hormone. Your caregiver inserts the tube in the inner part of the upper arm. The tube can remain in place for up to 3 years. After 3 years, the implant must be removed. The implant prevents the ovaries from releasing an egg (ovulation), thickens the cervical mucus which prevents sperm from entering the uterus, and thins the lining of the inside of the uterus.   Progesterone-only injections. These injections are given every 3 months by your caregiver to prevent pregnancy. This synthetic progesterone hormone stops the ovaries from releasing eggs. It also thickens cervical mucus and changes the uterine lining. This makes it harder for sperm to survive in the uterus.   Birth control pills. These pills contain estrogen and progesterone hormone. They work by stopping the egg from forming in the ovary (ovulation). Birth control pills are prescribed by a caregiver.Birth control pills can also be used to treat heavy periods.   Minipill. This type of birth control pill contains only the progesterone hormone. They are taken every day of each month and must be prescribed by your caregiver.   Birth control patch. The patch contains hormones similar to those in birth control pills. It must be changed once a week and is prescribed by a caregiver.   Vaginal ring. The ring contains hormones similar to those in birth control pills. It is left in the vagina for 3 weeks, removed for 1 week, and then a new one is put back in place. The patient must be comfortable inserting and removing the ring from the vagina.A caregiver's prescription is necessary.   Emergency contraception. Emergency contraceptives prevent pregnancy after unprotected sexual intercourse. This pill can be taken right after sex or up to 5 days after unprotected sex. It is most effective the sooner you take the pills after having sexual intercourse. Emergency  contraceptive pills are available without a prescription.   Check with your pharmacist. Do not use emergency contraception as your only form of birth control.  BARRIER METHODS   Female condom. This is a thin sheath (latex or rubber) that is worn over the penis during sexual intercourse. It can be used with spermicide to increase effectiveness.   Female condom. This is a soft, loose-fitting sheath that is put into the vagina before sexual intercourse.   Diaphragm. This is a soft, latex, dome-shaped barrier that must be fitted by a caregiver. It is inserted into the vagina, along with a spermicidal jelly. It is inserted before intercourse. The diaphragm should be left in the vagina for 6 to 8 hours after intercourse.   Cervical cap. This is a round, soft, latex or plastic cup that fits over the cervix and must be fitted by a caregiver. The cap can be left in place for up to 48 hours after intercourse.   Sponge. This is a soft, circular piece of polyurethane foam. The sponge has spermicide in it. It is inserted into the vagina after wetting it and before sexual intercourse.   Spermicides. These are chemicals that kill or block sperm from entering the cervix and uterus. They come in the form of creams, jellies, suppositories, foam, or tablets. They do not require a prescription. They are inserted into the vagina with an applicator before having sexual intercourse. The process must be repeated every time you have sexual intercourse.  INTRAUTERINE CONTRACEPTION  Intrauterine device (IUD). This is a T-shaped device that is put in a woman's uterus during a menstrual period to prevent pregnancy. There are 2 types:   Copper IUD. This type of IUD is wrapped in copper wire and is placed inside the uterus. Copper makes the uterus and fallopian tubes produce a fluid that kills sperm. It can stay in place for 10 years.   Hormone IUD. This type of IUD contains the hormone progestin (synthetic progesterone). The  hormone thickens the cervical mucus and prevents sperm from entering the uterus, and it also thins the uterine lining to prevent implantation of a fertilized egg. The hormone can weaken or kill the sperm that get into the uterus. It can stay in place for 5 years.  PERMANENT METHODS OF CONTRACEPTION  Female tubal ligation. This is when the woman's fallopian tubes are surgically sealed, tied, or blocked to prevent the egg from traveling to the uterus.   Female sterilization. This is when the female has the tubes that carry sperm tied off (vasectomy).This blocks sperm from entering the vagina during sexual intercourse. After the procedure, the man can still ejaculate fluid (semen).  NATURAL PLANNING METHODS  Natural family planning. This is not having sexual intercourse or using a barrier method (condom, diaphragm, cervical cap) on days the woman could become pregnant.   Calendar method. This is keeping track of the length of each menstrual cycle and identifying when you are fertile.   Ovulation method. This is avoiding sexual intercourse during ovulation.   Symptothermal method. This is avoiding sexual intercourse during ovulation, using a thermometer and ovulation symptoms.   Post-ovulation method. This is timing sexual intercourse after you have ovulated.  Regardless of which type or method of contraception you choose, it is important that you use condoms to protect against the transmission of sexually transmitted diseases (STDs). Talk with your caregiver about which form of contraception is most appropriate for you. Document Released: 10/07/2005 Document Revised: 09/26/2011 Document Reviewed: 02/13/2011 [employer] Patient Information 2012 [employer]. Breastfeeding BENEFITS OF BREASTFEEDING For   the baby  The first milk (colostrum) helps the baby's digestive system function better.   There are antibodies from the mother in the milk that help the baby fight off infections.   The baby has a  lower incidence of asthma, allergies, and SIDS (sudden infant death syndrome).   The nutrients in breast milk are better than formulas for the baby and helps the baby's brain grow better.   Babies who breastfeed have less gas, colic, and constipation.  For the mother  Breastfeeding helps develop a very special bond between mother and baby.   It is more convenient, always available at the correct temperature and cheaper than formula feeding.   It burns calories in the mother and helps with losing weight that was gained during pregnancy.   It makes the uterus contract back down to normal size faster and slows bleeding following delivery.   Breastfeeding mothers have a lower risk of developing breast cancer.  NURSE FREQUENTLY  A healthy, full-term baby may breastfeed as often as every hour or space his or her feedings to every 3 hours.   How often to nurse will vary from baby to baby. Watch your baby for signs of hunger, not the clock.   Nurse as often as the baby requests, or when you feel the need to reduce the fullness of your breasts.   Awaken the baby if it has been 3 to 4 hours since the last feeding.   Frequent feeding will help the mother make more milk and will prevent problems like sore nipples and engorgement of the breasts.  BABY'S POSITION AT THE BREAST  Whether lying down or sitting, be sure that the baby's tummy is facing your tummy.   Support the breast with 4 fingers underneath the breast and the thumb above. Make sure your fingers are well away from the nipple and baby's mouth.   Stroke the baby's lips and cheek closest to the breast gently with your finger or nipple.   When the baby's mouth is open wide enough, place all of your nipple and as much of the dark area around the nipple as possible into your baby's mouth.   Pull the baby in close so the tip of the nose and the baby's cheeks touch the breast during the feeding.  FEEDINGS  The length of each feeding  varies from baby to baby and from feeding to feeding.   The baby must suck about 2 to 3 minutes for your milk to get to him or her. This is called a "let down." For this reason, allow the baby to feed on each breast as long as he or she wants. Your baby will end the feeding when he or she has received the right balance of nutrients.   To break the suction, put your finger into the corner of the baby's mouth and slide it between his or her gums before removing your breast from his or her mouth. This will help prevent sore nipples.  REDUCING BREAST ENGORGEMENT  In the first week after your baby is born, you may experience signs of breast engorgement. When breasts are engorged, they feel heavy, warm, full, and may be tender to the touch. You can reduce engorgement if you:   Nurse frequently, every 2 to 3 hours. Mothers who breastfeed early and often have fewer problems with engorgement.   Place light ice packs on your breasts between feedings. This reduces swelling. Wrap the ice packs in a lightweight towel to protect   your skin.   Apply moist hot packs to your breast for 5 to 10 minutes before each feeding. This increases circulation and helps the milk flow.   Gently massage your breast before and during the feeding.   Make sure that the baby empties at least one breast at every feeding before switching sides.   Use a breast pump to empty the breasts if your baby is sleepy or not nursing well. You may also want to pump if you are returning to work or or you feel you are getting engorged.   Avoid bottle feeds, pacifiers or supplemental feedings of water or juice in place of breastfeeding.   Be sure the baby is latched on and positioned properly while breastfeeding.   Prevent fatigue, stress, and anemia.   Wear a supportive bra, avoiding underwire styles.   Eat a balanced diet with enough fluids.  If you follow these suggestions, your engorgement should improve in 24 to 48 hours. If you are  still experiencing difficulty, call your lactation consultant or caregiver. IS MY BABY GETTING ENOUGH MILK? Sometimes, mothers worry about whether their babies are getting enough milk. You can be assured that your baby is getting enough milk if:  The baby is actively sucking and you hear swallowing.   The baby nurses at least 8 to 12 times in a 24 hour time period. Nurse your baby until he or she unlatches or falls asleep at the first breast (at least 10 to 20 minutes), then offer the second side.   The baby is wetting 5 to 6 disposable diapers (6 to 8 cloth diapers) in a 24 hour period by 5 to 6 days of age.   The baby is having at least 2 to 3 stools every 24 hours for the first few months. Breast milk is all the food your baby needs. It is not necessary for your baby to have water or formula. In fact, to help your breasts make more milk, it is best not to give your baby supplemental feedings during the early weeks.   The stool should be soft and yellow.   The baby should gain 4 to 7 ounces per week after he is 4 days old.  TAKE CARE OF YOURSELF Take care of your breasts by:  Bathing or showering daily.   Avoiding the use of soaps on your nipples.   Start feedings on your left breast at one feeding and on your right breast at the next feeding.   You will notice an increase in your milk supply 2 to 5 days after delivery. You may feel some discomfort from engorgement, which makes your breasts very firm and often tender. Engorgement "peaks" out within 24 to 48 hours. In the meantime, apply warm moist towels to your breasts for 5 to 10 minutes before feeding. Gentle massage and expression of some milk before feeding will soften your breasts, making it easier for your baby to latch on. Wear a well fitting nursing bra and air dry your nipples for 10 to 15 minutes after each feeding.   Only use cotton bra pads.   Only use pure lanolin on your nipples after nursing. You do not need to wash it  off before nursing.  Take care of yourself by:   Eating well-balanced meals and nutritious snacks.   Drinking milk, fruit juice, and water to satisfy your thirst (about 8 glasses a day).   Getting plenty of rest.   Increasing calcium in your diet (1200 mg   a day).   Avoiding foods that you notice affect the baby in a bad way.  SEEK MEDICAL CARE IF:   You have any questions or difficulty with breastfeeding.   You need help.   You have a hard, red, sore area on your breast, accompanied by a fever of 100.5 F (38.1 C) or more.   Your baby is too sleepy to eat well or is having trouble sleeping.   Your baby is wetting less than 6 diapers per day, by 5 days of age.   Your baby's skin or white part of his or her eyes is more yellow than it was in the hospital.   You feel depressed.  Document Released: 10/07/2005 Document Revised: 09/26/2011 Document Reviewed: 05/22/2009 [employer] Patient Information 2012 [employer]. 

## 2012-01-20 NOTE — Progress Notes (Signed)
Doing well--BP is good, nml anatomy.

## 2012-02-03 ENCOUNTER — Ambulatory Visit (INDEPENDENT_AMBULATORY_CARE_PROVIDER_SITE_OTHER): Payer: BC Managed Care – PPO | Admitting: Obstetrics & Gynecology

## 2012-02-03 VITALS — BP 132/89 | Temp 98.5°F | Wt 191.1 lb

## 2012-02-03 DIAGNOSIS — O169 Unspecified maternal hypertension, unspecified trimester: Secondary | ICD-10-CM

## 2012-02-03 LAB — POCT URINALYSIS DIP (DEVICE)
Bilirubin Urine: NEGATIVE
Nitrite: NEGATIVE
Protein, ur: NEGATIVE mg/dL
Urobilinogen, UA: 0.2 mg/dL (ref 0.0–1.0)
pH: 7.5 (ref 5.0–8.0)

## 2012-02-03 NOTE — Progress Notes (Signed)
Pulse 87. Edema trace on feet, ankles, hands.

## 2012-02-10 ENCOUNTER — Ambulatory Visit (INDEPENDENT_AMBULATORY_CARE_PROVIDER_SITE_OTHER): Payer: BC Managed Care – PPO | Admitting: Family

## 2012-02-10 VITALS — BP 136/90 | Temp 98.1°F | Wt 192.2 lb

## 2012-02-10 DIAGNOSIS — O0990 Supervision of high risk pregnancy, unspecified, unspecified trimester: Secondary | ICD-10-CM

## 2012-02-10 DIAGNOSIS — O34219 Maternal care for unspecified type scar from previous cesarean delivery: Secondary | ICD-10-CM

## 2012-02-10 DIAGNOSIS — O169 Unspecified maternal hypertension, unspecified trimester: Secondary | ICD-10-CM

## 2012-02-10 LAB — POCT URINALYSIS DIP (DEVICE)
Ketones, ur: NEGATIVE mg/dL
Nitrite: NEGATIVE
Protein, ur: 30 mg/dL — AB
Urobilinogen, UA: 2 mg/dL — ABNORMAL HIGH (ref 0.0–1.0)

## 2012-02-10 NOTE — Progress Notes (Signed)
Pulse 71  Edema trace in feet.  

## 2012-02-10 NOTE — Progress Notes (Signed)
No problems or concerns; reviewed blood pressure; no meds at this time.

## 2012-02-12 LAB — CULTURE, OB URINE

## 2012-02-24 ENCOUNTER — Ambulatory Visit (INDEPENDENT_AMBULATORY_CARE_PROVIDER_SITE_OTHER): Payer: BC Managed Care – PPO | Admitting: Obstetrics and Gynecology

## 2012-02-24 VITALS — BP 150/88 | Temp 98.0°F | Wt 190.9 lb

## 2012-02-24 DIAGNOSIS — O0990 Supervision of high risk pregnancy, unspecified, unspecified trimester: Secondary | ICD-10-CM

## 2012-02-24 DIAGNOSIS — I10 Essential (primary) hypertension: Secondary | ICD-10-CM

## 2012-02-24 DIAGNOSIS — O169 Unspecified maternal hypertension, unspecified trimester: Secondary | ICD-10-CM

## 2012-02-24 DIAGNOSIS — O34219 Maternal care for unspecified type scar from previous cesarean delivery: Secondary | ICD-10-CM

## 2012-02-24 LAB — POCT URINALYSIS DIP (DEVICE)
Ketones, ur: NEGATIVE mg/dL
Protein, ur: NEGATIVE mg/dL
Specific Gravity, Urine: 1.015 (ref 1.005–1.030)
Urobilinogen, UA: 0.2 mg/dL (ref 0.0–1.0)
pH: 7 (ref 5.0–8.0)

## 2012-02-24 LAB — CBC
HCT: 32.3 % — ABNORMAL LOW (ref 36.0–46.0)
MCH: 27.7 pg (ref 26.0–34.0)
MCV: 85.2 fL (ref 78.0–100.0)
Platelets: 206 10*3/uL (ref 150–400)
RDW: 13.6 % (ref 11.5–15.5)

## 2012-02-24 MED ORDER — LABETALOL HCL 200 MG PO TABS: 200.0000 mg | ORAL_TABLET | Freq: Two times a day (BID) | ORAL | Status: DC

## 2012-02-24 NOTE — Progress Notes (Signed)
Pulse 81 Needs CBC, RPR, HIV and 1 hr @ 922

## 2012-02-24 NOTE — Progress Notes (Signed)
Patient doing well without complaints. Elevated BP. Will start labetalol 200 BID. Will schedule f/u growth ultrasound. 1 hr GCT today

## 2012-02-24 NOTE — Progress Notes (Signed)
U/S scheduled Feb 27, 2012 at 915 am.

## 2012-02-27 ENCOUNTER — Ambulatory Visit (HOSPITAL_COMMUNITY)
Admission: RE | Admit: 2012-02-27 | Discharge: 2012-02-27 | Disposition: A | Discharge status: Home / Self Care | Payer: BC Managed Care – PPO | Source: Ambulatory Visit | Attending: Obstetrics and Gynecology | Admitting: Obstetrics and Gynecology

## 2012-02-27 DIAGNOSIS — I10 Essential (primary) hypertension: Secondary | ICD-10-CM

## 2012-02-27 DIAGNOSIS — O169 Unspecified maternal hypertension, unspecified trimester: Secondary | ICD-10-CM

## 2012-02-27 DIAGNOSIS — O10019 Pre-existing essential hypertension complicating pregnancy, unspecified trimester: Secondary | ICD-10-CM | POA: Insufficient documentation

## 2012-02-27 DIAGNOSIS — O0990 Supervision of high risk pregnancy, unspecified, unspecified trimester: Secondary | ICD-10-CM

## 2012-02-27 DIAGNOSIS — Z3689 Encounter for other specified antenatal screening: Secondary | ICD-10-CM | POA: Insufficient documentation

## 2012-03-02 ENCOUNTER — Ambulatory Visit (INDEPENDENT_AMBULATORY_CARE_PROVIDER_SITE_OTHER): Payer: BC Managed Care – PPO | Admitting: Family Medicine

## 2012-03-02 VITALS — BP 143/90 | Temp 98.1°F | Wt 192.9 lb

## 2012-03-02 DIAGNOSIS — J029 Acute pharyngitis, unspecified: Secondary | ICD-10-CM

## 2012-03-02 DIAGNOSIS — O169 Unspecified maternal hypertension, unspecified trimester: Secondary | ICD-10-CM

## 2012-03-02 DIAGNOSIS — O0990 Supervision of high risk pregnancy, unspecified, unspecified trimester: Secondary | ICD-10-CM

## 2012-03-02 LAB — POCT URINALYSIS DIP (DEVICE)
Bilirubin Urine: NEGATIVE
Glucose, UA: NEGATIVE mg/dL
Nitrite: NEGATIVE
Urobilinogen, UA: 0.2 mg/dL (ref 0.0–1.0)
pH: 8 (ref 5.0–8.0)

## 2012-03-02 NOTE — Progress Notes (Signed)
C/o sore throat, difficulty swallowing.  Will check rapid strep.  No fever.  Also c/o dysuria, blood in urine; will check culture U/S on 5/9 shows 2 lb 12 oz, vtx, AFI 15.99, cx 3.8 cm 1 hour 128, mildly anemic

## 2012-03-02 NOTE — Progress Notes (Signed)
Pulse 80 Patient reports some pelvic pressure.

## 2012-03-02 NOTE — Patient Instructions (Signed)

## 2012-03-03 ENCOUNTER — Telehealth: Payer: Self-pay | Admitting: *Deleted

## 2012-03-03 LAB — RAPID STREP SCREEN (MED CTR MEBANE ONLY): Streptococcus, Group A Screen (Direct): NEGATIVE

## 2012-03-03 NOTE — Telephone Encounter (Signed)
Message copied by Jill Side on Tue Mar 03, 2012  2:07 PM ------      Message from: Reva Bores      Created: Tue Mar 03, 2012  8:27 AM       Strep neg-inform pt.

## 2012-03-03 NOTE — Telephone Encounter (Signed)
Called pt and informed of negative strep test from yesterday.  Pt asked about results of urine culture. I stated that it is not completed- we should have results by tomorrow. Pt voiced understanding.

## 2012-03-06 LAB — CULTURE, OB URINE: Colony Count: 40000

## 2012-03-23 ENCOUNTER — Ambulatory Visit (INDEPENDENT_AMBULATORY_CARE_PROVIDER_SITE_OTHER): Payer: BC Managed Care – PPO | Admitting: Family Medicine

## 2012-03-23 VITALS — BP 146/96 | Temp 98.5°F | Wt 194.7 lb

## 2012-03-23 DIAGNOSIS — O169 Unspecified maternal hypertension, unspecified trimester: Secondary | ICD-10-CM

## 2012-03-23 DIAGNOSIS — O139 Gestational [pregnancy-induced] hypertension without significant proteinuria, unspecified trimester: Secondary | ICD-10-CM

## 2012-03-23 LAB — POCT URINALYSIS DIP (DEVICE)
Ketones, ur: NEGATIVE mg/dL
Protein, ur: 30 mg/dL — AB
Specific Gravity, Urine: 1.015 (ref 1.005–1.030)
pH: 7 (ref 5.0–8.0)

## 2012-03-23 NOTE — Progress Notes (Signed)
U/S scheduled March 30, 2012 at 1045 am.

## 2012-03-23 NOTE — Progress Notes (Signed)
Pulse: 88 Pt says she has pressure, she feels the urge to urinate but not much comes out. Has some swelling in her feet and ankles.  Pt has not taken her labetalol today.

## 2012-03-23 NOTE — Progress Notes (Signed)
No medicine today-will hold on increasing medication.  Last u/s for growth 4 wks ago.  Needs another.  Will start 2x/wk testing next week. Wants to come out of work.  U/S growth next week.

## 2012-03-23 NOTE — Patient Instructions (Addendum)
Vaginal Birth After Cesarean Delivery Vaginal birth after Cesarean delivery (VBAC) is giving birth vaginally after previously delivering a baby by a cesarean. In the past, if a woman had a Cesarean delivery, all births afterwards would be done by Cesarean delivery. This is no longer true. It can be safe for the mother to try a vaginal delivery after having a Cesarean. The final decision to have a VBAC or repeat Cesarean delivery should be between the patient and her caregiver. The risks and benefits can be discussed relative to the reason for, and the type of the previous Cesarean delivery. WOMEN WHO PLAN TO HAVE A VBAC SHOULD CHECK WITH THEIR DOCTOR TO BE SURE THAT:  The previous Cesarean was done with a low transverse uterine incision (not a vertical classical incision).   The birth canal is big enough for the baby.   There were no other operations on the uterus.   They will have an electronic fetal monitor (EFM) on at all times during labor.   An operating room would be available and ready in case an emergency Cesarean is needed.   A doctor and surgical nursing staff would be available at all times during labor to be ready to do an emergency Cesarean if necessary.   An anesthesiologist would be present in case an emergency Cesarean is needed.   The nursery is prepared and has adequate personnel and necessary equipment available to care for the baby in case of an emergency Cesarean.  BENEFITS OF VBAC:  Shorter stay in the hospital.   Lower delivery, nursery and hospital costs.   Less blood loss and need for blood transfusions.   Less fever and discomfort from major surgery.   Lower risk of blood clots.   Lower risk of infection.   Shorter recovery after going home.   Lower risk of other surgical complications, such as opening of the incision or hernia in the incision.   Decreased risk of injury to other organs.   Decreased risk for having to remove the uterus (hysterectomy).     Decreased risk for the placenta to completely or partially cover the opening of the uterus (placenta previa) with a future pregnancy.   Ability to have a larger family if desired.  RISKS OF A VBAC:  Rupture of the uterus.   Having to remove the uterus (hysterectomy) if it ruptures.   All the complications of major surgery and/or injury to other organs.   Excessive bleeding, blood clots and infection.   Lower Apgar scores (method to evaluate the newborn based on appearance, pulse, grimace, activity, and respiration) and more risks to the baby.   There is a higher risk of uterine rupture if you induce or augment labor.   There is a higher risk of uterine rupture if you use medications to ripen the cervix.  VBAC SHOULD NOT BE DONE IF:  The previous Cesarean was done with a vertical (classical) or T-shaped incision, or you do not know what kind of an incision was made.   You had a ruptured uterus.   You had surgery on your uterus.   You have medical or obstetrical problems.   There are problems with the baby.   There were two previous Cesarean deliveries and no vaginal deliveries.  OTHER FACTS TO KNOW ABOUT VBAC:  It is safe to have an epidural anesthetic with VBAC.   It is safe to turn the baby from a breech position (attempt an external cephalic version).   It is  safe to try a VBAC with twins.   Pregnancies later than 40 weeks have not been successful with VBAC.   There is an increased failure rate of a VBAC in obese pregnant women.   There is an increased failure rate with VABC if the baby weighs 8.8 pounds (4000 grams) or more.   There is an increased failure rate if the time between the Cesarean and VBAC is less than 19 months.   There is an increased failure rate if pre-eclampsia is present (high blood pressure, protein in the urine and swelling of face and extremities).   VBAC is very successful if there was a previous vaginal birth.   VBAC is very successful  when the labor starts spontaneously before the due date.   Delivery of VBAC is similar to having a normal spontaneous vaginal delivery.  It is important to discuss VBAC with your caregiver early in the pregnancy so you can understand the risks, benefits and options. It will give you time to decide what is best in your particular case relevant to the reason for your previous Cesarean delivery. It should be understood that medical changes in the mother or pregnancy may occur during the pregnancy, which make it necessary to change you or your caregiver's initial decision. The counseling, concerns and decisions should be documented in the medical record and signed by all parties. Document Released: 03/30/2007 Document Revised: 09/26/2011 Document Reviewed: 11/18/2008 [facility] Patient Information 2012 [place], [place]. Contraception Choices Contraception (birth control) is the use of any methods or devices to prevent pregnancy. Below are some methods to help avoid pregnancy. HORMONAL METHODS   Contraceptive implant. This is a thin, plastic tube containing progesterone hormone. It does not contain estrogen hormone. Your caregiver inserts the tube in the inner part of the upper arm. The tube can remain in place for up to 3 years. After 3 years, the implant must be removed. The implant prevents the ovaries from releasing an egg (ovulation), thickens the cervical mucus which prevents sperm from entering the uterus, and thins the lining of the inside of the uterus.   Progesterone-only injections. These injections are given every 3 months by your caregiver to prevent pregnancy. This synthetic progesterone hormone stops the ovaries from releasing eggs. It also thickens cervical mucus and changes the uterine lining. This makes it harder for sperm to survive in the uterus.   Birth control pills. These pills contain estrogen and progesterone hormone. They work by stopping the egg from forming in the ovary (ovulation).  Birth control pills are prescribed by a caregiver.Birth control pills can also be used to treat heavy periods.   Minipill. This type of birth control pill contains only the progesterone hormone. They are taken every day of each month and must be prescribed by your caregiver.   Birth control patch. The patch contains hormones similar to those in birth control pills. It must be changed once a week and is prescribed by a caregiver.   Vaginal ring. The ring contains hormones similar to those in birth control pills. It is left in the vagina for 3 weeks, removed for 1 week, and then a new one is put back in place. The patient must be comfortable inserting and removing the ring from the vagina.A caregiver's prescription is necessary.   Emergency contraception. Emergency contraceptives prevent pregnancy after unprotected sexual intercourse. This pill can be taken right after sex or up to 5 days after unprotected sex. It is most effective the sooner you take the  pills after having sexual intercourse. Emergency contraceptive pills are available without a prescription. Check with your pharmacist. Do not use emergency contraception as your only form of birth control.  BARRIER METHODS   Female condom. This is a thin sheath (latex or rubber) that is worn over the penis during sexual intercourse. It can be used with spermicide to increase effectiveness.   Female condom. This is a soft, loose-fitting sheath that is put into the vagina before sexual intercourse.   Diaphragm. This is a soft, latex, dome-shaped barrier that must be fitted by a caregiver. It is inserted into the vagina, along with a spermicidal jelly. It is inserted before intercourse. The diaphragm should be left in the vagina for 6 to 8 hours after intercourse.   Cervical cap. This is a round, soft, latex or plastic cup that fits over the cervix and must be fitted by a caregiver. The cap can be left in place for up to 48 hours after intercourse.    Sponge. This is a soft, circular piece of polyurethane foam. The sponge has spermicide in it. It is inserted into the vagina after wetting it and before sexual intercourse.   Spermicides. These are chemicals that kill or block sperm from entering the cervix and uterus. They come in the form of creams, jellies, suppositories, foam, or tablets. They do not require a prescription. They are inserted into the vagina with an applicator before having sexual intercourse. The process must be repeated every time you have sexual intercourse.  INTRAUTERINE CONTRACEPTION  Intrauterine device (IUD). This is a T-shaped device that is put in a woman's uterus during a menstrual period to prevent pregnancy. There are 2 types:   Copper IUD. This type of IUD is wrapped in copper wire and is placed inside the uterus. Copper makes the uterus and fallopian tubes produce a fluid that kills sperm. It can stay in place for 10 years.   Hormone IUD. This type of IUD contains the hormone progestin (synthetic progesterone). The hormone thickens the cervical mucus and prevents sperm from entering the uterus, and it also thins the uterine lining to prevent implantation of a fertilized egg. The hormone can weaken or kill the sperm that get into the uterus. It can stay in place for 5 years.  PERMANENT METHODS OF CONTRACEPTION  Female tubal ligation. This is when the woman's fallopian tubes are surgically sealed, tied, or blocked to prevent the egg from traveling to the uterus.   Female sterilization. This is when the female has the tubes that carry sperm tied off (vasectomy).This blocks sperm from entering the vagina during sexual intercourse. After the procedure, the man can still ejaculate fluid (semen).  NATURAL PLANNING METHODS  Natural family planning. This is not having sexual intercourse or using a barrier method (condom, diaphragm, cervical cap) on days the woman could become pregnant.   Calendar method. This is keeping  track of the length of each menstrual cycle and identifying when you are fertile.   Ovulation method. This is avoiding sexual intercourse during ovulation.   Symptothermal method. This is avoiding sexual intercourse during ovulation, using a thermometer and ovulation symptoms.   Post-ovulation method. This is timing sexual intercourse after you have ovulated.  Regardless of which type or method of contraception you choose, it is important that you use condoms to protect against the transmission of sexually transmitted diseases (STDs). Talk with your caregiver about which form of contraception is most appropriate for you. Document Released: 10/07/2005 Document Revised: 09/26/2011  Document Reviewed: 02/13/2011 [facility] Patient Information 2012 [place], [place]. Breastfeeding BENEFITS OF BREASTFEEDING For the baby  The first milk (colostrum) helps the baby's digestive system function better.   There are antibodies from the mother in the milk that help the baby fight off infections.   The baby has a lower incidence of asthma, allergies, and SIDS (sudden infant death syndrome).   The nutrients in breast milk are better than formulas for the baby and helps the baby's brain grow better.   Babies who breastfeed have less gas, colic, and constipation.  For the mother  Breastfeeding helps develop a very special bond between mother and baby.   It is more convenient, always available at the correct temperature and cheaper than formula feeding.   It burns calories in the mother and helps with losing weight that was gained during pregnancy.   It makes the uterus contract back down to normal size faster and slows bleeding following delivery.   Breastfeeding mothers have a lower risk of developing breast cancer.  NURSE FREQUENTLY  A healthy, full-term baby may breastfeed as often as every hour or space his or her feedings to every 3 hours.   How often to nurse will vary from baby to baby. Watch  your baby for signs of hunger, not the clock.   Nurse as often as the baby requests, or when you feel the need to reduce the fullness of your breasts.   Awaken the baby if it has been 3 to 4 hours since the last feeding.   Frequent feeding will help the mother make more milk and will prevent problems like sore nipples and engorgement of the breasts.  BABY'S POSITION AT THE BREAST  Whether lying down or sitting, be sure that the baby's tummy is facing your tummy.   Support the breast with 4 fingers underneath the breast and the thumb above. Make sure your fingers are well away from the nipple and baby's mouth.   Stroke the baby's lips and cheek closest to the breast gently with your finger or nipple.   When the baby's mouth is open wide enough, place all of your nipple and as much of the dark area around the nipple as possible into your baby's mouth.   Pull the baby in close so the tip of the nose and the baby's cheeks touch the breast during the feeding.  FEEDINGS  The length of each feeding varies from baby to baby and from feeding to feeding.   The baby must suck about 2 to 3 minutes for your milk to get to him or her. This is called a "let down." For this reason, allow the baby to feed on each breast as long as he or she wants. Your baby will end the feeding when he or she has received the right balance of nutrients.   To break the suction, put your finger into the corner of the baby's mouth and slide it between his or her gums before removing your breast from his or her mouth. This will help prevent sore nipples.  REDUCING BREAST ENGORGEMENT  In the first week after your baby is born, you may experience signs of breast engorgement. When breasts are engorged, they feel heavy, warm, full, and may be tender to the touch. You can reduce engorgement if you:   Nurse frequently, every 2 to 3 hours. Mothers who breastfeed early and often have fewer problems with engorgement.   Place light  ice packs on your breasts between  feedings. This reduces swelling. Wrap the ice packs in a lightweight towel to protect your skin.   Apply moist hot packs to your breast for 5 to 10 minutes before each feeding. This increases circulation and helps the milk flow.   Gently massage your breast before and during the feeding.   Make sure that the baby empties at least one breast at every feeding before switching sides.   Use a breast pump to empty the breasts if your baby is sleepy or not nursing well. You may also want to pump if you are returning to work or or you feel you are getting engorged.   Avoid bottle feeds, pacifiers or supplemental feedings of water or juice in place of breastfeeding.   Be sure the baby is latched on and positioned properly while breastfeeding.   Prevent fatigue, stress, and anemia.   Wear a supportive bra, avoiding underwire styles.   Eat a balanced diet with enough fluids.  If you follow these suggestions, your engorgement should improve in 24 to 48 hours. If you are still experiencing difficulty, call your lactation consultant or caregiver. IS MY BABY GETTING ENOUGH MILK? Sometimes, mothers worry about whether their babies are getting enough milk. You can be assured that your baby is getting enough milk if:  The baby is actively sucking and you hear swallowing.   The baby nurses at least 8 to 12 times in a 24 hour time period. Nurse your baby until he or she unlatches or falls asleep at the first breast (at least 10 to 20 minutes), then offer the second side.   The baby is wetting 5 to 6 disposable diapers (6 to 8 cloth diapers) in a 24 hour period by 61 to 46 days of age.   The baby is having at least 2 to 3 stools every 24 hours for the first few months. Breast milk is all the food your baby needs. It is not necessary for your baby to have water or formula. In fact, to help your breasts make more milk, it is best not to give your baby supplemental feedings  during the early weeks.   The stool should be soft and yellow.   The baby should gain 4 to 7 ounces per week after he is 31 days old.  TAKE CARE OF YOURSELF Take care of your breasts by:  Bathing or showering daily.   Avoiding the use of soaps on your nipples.   Start feedings on your left breast at one feeding and on your right breast at the next feeding.   You will notice an increase in your milk supply 2 to 5 days after delivery. You may feel some discomfort from engorgement, which makes your breasts very firm and often tender. Engorgement "peaks" out within 24 to 48 hours. In the meantime, apply warm moist towels to your breasts for 5 to 10 minutes before feeding. Gentle massage and expression of some milk before feeding will soften your breasts, making it easier for your baby to latch on. Wear a well fitting nursing bra and air dry your nipples for 10 to 15 minutes after each feeding.   Only use cotton bra pads.   Only use pure lanolin on your nipples after nursing. You do not need to wash it off before nursing.  Take care of yourself by:   Eating well-balanced meals and nutritious snacks.   Drinking milk, fruit juice, and water to satisfy your thirst (about 8 glasses a day).  Getting plenty of rest.   Increasing calcium in your diet (1200 mg a day).   Avoiding foods that you notice affect the baby in a bad way.  SEEK MEDICAL CARE IF:   You have any questions or difficulty with breastfeeding.   You need help.   You have a hard, red, sore area on your breast, accompanied by a fever of 100.5 F (38.1 C) or more.   Your baby is too sleepy to eat well or is having trouble sleeping.   Your baby is wetting less than 6 diapers per day, by 18 days of age.   Your baby's skin or white part of his or her eyes is more yellow than it was in the hospital.   You feel depressed.  Document Released: 10/07/2005 Document Revised: 09/26/2011 Document Reviewed: 05/22/2009 [facility]  Patient Information 2012 [place], [place].

## 2012-03-30 ENCOUNTER — Ambulatory Visit (HOSPITAL_COMMUNITY)
Admission: RE | Admit: 2012-03-30 | Discharge: 2012-03-30 | Disposition: A | Discharge status: Home / Self Care | Payer: BC Managed Care – PPO | Source: Ambulatory Visit | Attending: Family Medicine | Admitting: Family Medicine

## 2012-03-30 ENCOUNTER — Ambulatory Visit (INDEPENDENT_AMBULATORY_CARE_PROVIDER_SITE_OTHER): Payer: BC Managed Care – PPO | Admitting: Obstetrics & Gynecology

## 2012-03-30 VITALS — BP 134/81 | Temp 98.3°F | Wt 196.2 lb

## 2012-03-30 DIAGNOSIS — O169 Unspecified maternal hypertension, unspecified trimester: Secondary | ICD-10-CM

## 2012-03-30 DIAGNOSIS — O10019 Pre-existing essential hypertension complicating pregnancy, unspecified trimester: Secondary | ICD-10-CM | POA: Insufficient documentation

## 2012-03-30 DIAGNOSIS — Z3689 Encounter for other specified antenatal screening: Secondary | ICD-10-CM | POA: Insufficient documentation

## 2012-03-30 DIAGNOSIS — O139 Gestational [pregnancy-induced] hypertension without significant proteinuria, unspecified trimester: Secondary | ICD-10-CM

## 2012-03-30 DIAGNOSIS — O0990 Supervision of high risk pregnancy, unspecified, unspecified trimester: Secondary | ICD-10-CM

## 2012-03-30 LAB — POCT URINALYSIS DIP (DEVICE)
Protein, ur: 30 mg/dL — AB
Specific Gravity, Urine: 1.02 (ref 1.005–1.030)
Urobilinogen, UA: 0.2 mg/dL (ref 0.0–1.0)

## 2012-03-30 NOTE — Progress Notes (Signed)
NST today reviewed, reactive. Needs to review and sign TOLAC consent. Did not take labetalol yet today.

## 2012-03-30 NOTE — Patient Instructions (Signed)

## 2012-03-30 NOTE — Progress Notes (Signed)
Pt denies H/A or visual disturbances.   [place] growth today.

## 2012-03-30 NOTE — Progress Notes (Signed)
Pulse: 76 Pt has not taken the labetalol yet today.

## 2012-04-02 ENCOUNTER — Ambulatory Visit (INDEPENDENT_AMBULATORY_CARE_PROVIDER_SITE_OTHER): Payer: BC Managed Care – PPO | Admitting: *Deleted

## 2012-04-02 VITALS — BP 140/78 | Wt 195.2 lb

## 2012-04-02 DIAGNOSIS — O169 Unspecified maternal hypertension, unspecified trimester: Secondary | ICD-10-CM

## 2012-04-02 DIAGNOSIS — O139 Gestational [pregnancy-induced] hypertension without significant proteinuria, unspecified trimester: Secondary | ICD-10-CM

## 2012-04-02 NOTE — Progress Notes (Signed)
P-82 

## 2012-04-02 NOTE — Progress Notes (Signed)
Reactive NST 04/02/12

## 2012-04-06 ENCOUNTER — Ambulatory Visit (INDEPENDENT_AMBULATORY_CARE_PROVIDER_SITE_OTHER): Payer: BC Managed Care – PPO | Admitting: Obstetrics and Gynecology

## 2012-04-06 VITALS — BP 129/91 | Temp 97.9°F | Wt 194.6 lb

## 2012-04-06 DIAGNOSIS — O0990 Supervision of high risk pregnancy, unspecified, unspecified trimester: Secondary | ICD-10-CM

## 2012-04-06 DIAGNOSIS — O139 Gestational [pregnancy-induced] hypertension without significant proteinuria, unspecified trimester: Secondary | ICD-10-CM

## 2012-04-06 DIAGNOSIS — I10 Essential (primary) hypertension: Secondary | ICD-10-CM

## 2012-04-06 DIAGNOSIS — O169 Unspecified maternal hypertension, unspecified trimester: Secondary | ICD-10-CM

## 2012-04-06 DIAGNOSIS — O34219 Maternal care for unspecified type scar from previous cesarean delivery: Secondary | ICD-10-CM

## 2012-04-06 LAB — POCT URINALYSIS DIP (DEVICE)
Nitrite: NEGATIVE
Urobilinogen, UA: 1 mg/dL (ref 0.0–1.0)
pH: 6.5 (ref 5.0–8.0)

## 2012-04-06 NOTE — Progress Notes (Signed)
NST reviewed and reactive. FM/PTL precautions reviewed. Continue monitoring BP. Patient desires TOLAC. Consent signed

## 2012-04-06 NOTE — Progress Notes (Signed)
Pulse 89 Patient reports pressure in lower abdomen

## 2012-04-07 DIAGNOSIS — O0990 Supervision of high risk pregnancy, unspecified, unspecified trimester: Secondary | ICD-10-CM

## 2012-04-09 ENCOUNTER — Ambulatory Visit (INDEPENDENT_AMBULATORY_CARE_PROVIDER_SITE_OTHER): Payer: BC Managed Care – PPO | Admitting: *Deleted

## 2012-04-09 VITALS — BP 145/85 | Wt 194.0 lb

## 2012-04-09 DIAGNOSIS — I10 Essential (primary) hypertension: Secondary | ICD-10-CM

## 2012-04-09 DIAGNOSIS — O139 Gestational [pregnancy-induced] hypertension without significant proteinuria, unspecified trimester: Secondary | ICD-10-CM

## 2012-04-09 NOTE — Progress Notes (Signed)
P=99, here for NST

## 2012-04-13 ENCOUNTER — Ambulatory Visit (HOSPITAL_COMMUNITY)
Admission: RE | Admit: 2012-04-13 | Discharge: 2012-04-13 | Disposition: A | Discharge status: Home / Self Care | Payer: BC Managed Care – PPO | Source: Ambulatory Visit | Attending: Obstetrics and Gynecology | Admitting: Obstetrics and Gynecology

## 2012-04-13 ENCOUNTER — Ambulatory Visit (INDEPENDENT_AMBULATORY_CARE_PROVIDER_SITE_OTHER): Payer: BC Managed Care – PPO | Admitting: Obstetrics & Gynecology

## 2012-04-13 VITALS — BP 138/93 | Temp 98.1°F | Wt 193.3 lb

## 2012-04-13 DIAGNOSIS — I10 Essential (primary) hypertension: Secondary | ICD-10-CM

## 2012-04-13 DIAGNOSIS — O0990 Supervision of high risk pregnancy, unspecified, unspecified trimester: Secondary | ICD-10-CM

## 2012-04-13 DIAGNOSIS — O169 Unspecified maternal hypertension, unspecified trimester: Secondary | ICD-10-CM

## 2012-04-13 DIAGNOSIS — O139 Gestational [pregnancy-induced] hypertension without significant proteinuria, unspecified trimester: Secondary | ICD-10-CM

## 2012-04-13 DIAGNOSIS — O34219 Maternal care for unspecified type scar from previous cesarean delivery: Secondary | ICD-10-CM

## 2012-04-13 DIAGNOSIS — Z3689 Encounter for other specified antenatal screening: Secondary | ICD-10-CM | POA: Insufficient documentation

## 2012-04-13 DIAGNOSIS — O10019 Pre-existing essential hypertension complicating pregnancy, unspecified trimester: Secondary | ICD-10-CM | POA: Insufficient documentation

## 2012-04-13 LAB — POCT URINALYSIS DIP (DEVICE)
Ketones, ur: NEGATIVE mg/dL
Protein, ur: 100 mg/dL — AB
Specific Gravity, Urine: 1.025 (ref 1.005–1.030)
pH: 6.5 (ref 5.0–8.0)

## 2012-04-13 NOTE — Progress Notes (Signed)
Pulse 86 Patient reports some pelvic pressure and occasional yellow mucous d/c but no foul odor or itching

## 2012-04-13 NOTE — Patient Instructions (Signed)
Return to clinic for any obstetric concerns or go to [facility] for evaluation  

## 2012-04-13 NOTE — Progress Notes (Signed)
AFI today 9.01 cm, will repeat next week.  NST performed on 04/13/2012 was reviewed and was found to be reactive.  Continue recommended antenatal testing and prenatal care.  Plans to breast feed; considering BTS for contraception, papers signed today after discussion of risks, benefits, alternatives.  No other complaints or concerns.  Fetal movement and labor precautions reviewed.

## 2012-04-16 ENCOUNTER — Ambulatory Visit (INDEPENDENT_AMBULATORY_CARE_PROVIDER_SITE_OTHER): Payer: BC Managed Care – PPO | Admitting: *Deleted

## 2012-04-16 VITALS — BP 146/84 | Temp 98.9°F | Wt 193.6 lb

## 2012-04-16 DIAGNOSIS — O139 Gestational [pregnancy-induced] hypertension without significant proteinuria, unspecified trimester: Secondary | ICD-10-CM

## 2012-04-16 NOTE — Progress Notes (Signed)
P=:98, here for NST

## 2012-04-16 NOTE — Progress Notes (Signed)
NST 04/16/12 reactive 

## 2012-04-20 ENCOUNTER — Ambulatory Visit (INDEPENDENT_AMBULATORY_CARE_PROVIDER_SITE_OTHER): Payer: BC Managed Care – PPO | Admitting: Physician Assistant

## 2012-04-20 ENCOUNTER — Ambulatory Visit (HOSPITAL_COMMUNITY): Payer: BC Managed Care – PPO

## 2012-04-20 VITALS — BP 159/88 | Temp 97.9°F | Wt 194.0 lb

## 2012-04-20 DIAGNOSIS — O169 Unspecified maternal hypertension, unspecified trimester: Secondary | ICD-10-CM

## 2012-04-20 DIAGNOSIS — I10 Essential (primary) hypertension: Secondary | ICD-10-CM

## 2012-04-20 DIAGNOSIS — O139 Gestational [pregnancy-induced] hypertension without significant proteinuria, unspecified trimester: Secondary | ICD-10-CM

## 2012-04-20 LAB — POCT URINALYSIS DIP (DEVICE)
Ketones, ur: NEGATIVE mg/dL
Protein, ur: 30 mg/dL — AB
Specific Gravity, Urine: 1.015 (ref 1.005–1.030)
Urobilinogen, UA: 0.2 mg/dL (ref 0.0–1.0)
pH: 7 (ref 5.0–8.0)

## 2012-04-20 LAB — COMPREHENSIVE METABOLIC PANEL
ALT: 8 U/L (ref 0–35)
CO2: 23 mEq/L (ref 19–32)
Creat: 0.49 mg/dL — ABNORMAL LOW (ref 0.50–1.10)
Glucose, Bld: 88 mg/dL (ref 70–99)
Total Bilirubin: 0.3 mg/dL (ref 0.3–1.2)

## 2012-04-20 LAB — CBC
MCH: 27.5 pg (ref 26.0–34.0)
MCV: 80.6 fL (ref 78.0–100.0)
Platelets: 261 10*3/uL (ref 150–400)
RBC: 3.82 MIL/uL — ABNORMAL LOW (ref 3.87–5.11)

## 2012-04-20 NOTE — Progress Notes (Signed)
P=86, c/o trace edema in feet/hands, states Saturday feet /hands so swollen it was hurting, states took her bp medicine at 7:30 because running late, denies headaches, visual changes

## 2012-04-20 NOTE — Progress Notes (Signed)
BP recheck 148/97.  Per Rosalita Chessman to continue her BP medication regimen as prescribed.

## 2012-04-20 NOTE — Patient Instructions (Addendum)

## 2012-04-20 NOTE — Progress Notes (Signed)
Denies s/s of pre-x. Reports taking BP med later than usual this morning. Will re-check BP: 147/92

## 2012-04-21 LAB — PROTEIN / CREATININE RATIO, URINE
Creatinine, Urine: 131.9 mg/dL
Total Protein, Urine: 23 mg/dL

## 2012-04-22 ENCOUNTER — Ambulatory Visit (INDEPENDENT_AMBULATORY_CARE_PROVIDER_SITE_OTHER): Payer: BC Managed Care – PPO | Admitting: *Deleted

## 2012-04-22 VITALS — BP 155/82 | Wt 196.5 lb

## 2012-04-22 DIAGNOSIS — O169 Unspecified maternal hypertension, unspecified trimester: Secondary | ICD-10-CM

## 2012-04-22 DIAGNOSIS — O139 Gestational [pregnancy-induced] hypertension without significant proteinuria, unspecified trimester: Secondary | ICD-10-CM

## 2012-04-22 MED ORDER — LABETALOL HCL 200 MG PO TABS: ORAL_TABLET | ORAL | Status: DC

## 2012-04-22 NOTE — Progress Notes (Signed)
P = 68   Pt denies H/A or visual disturbances.   Labs reviewed from 7/1- no pre-eclampsia.  BP results and labs reported to Dr. Shawnie Pons- increase Labetalol to 400 mg po BID.  Pt voiced understanding.

## 2012-04-22 NOTE — Progress Notes (Signed)
NST reviewed and reactive. Will increase her Labetalol.

## 2012-04-27 ENCOUNTER — Ambulatory Visit (INDEPENDENT_AMBULATORY_CARE_PROVIDER_SITE_OTHER): Payer: BC Managed Care – PPO | Admitting: Obstetrics & Gynecology

## 2012-04-27 VITALS — BP 147/90 | Temp 98.0°F | Wt 194.0 lb

## 2012-04-27 DIAGNOSIS — O139 Gestational [pregnancy-induced] hypertension without significant proteinuria, unspecified trimester: Secondary | ICD-10-CM

## 2012-04-27 DIAGNOSIS — I10 Essential (primary) hypertension: Secondary | ICD-10-CM

## 2012-04-27 DIAGNOSIS — O0993 Supervision of high risk pregnancy, unspecified, third trimester: Secondary | ICD-10-CM

## 2012-04-27 LAB — POCT URINALYSIS DIP (DEVICE)
Bilirubin Urine: NEGATIVE
Glucose, UA: NEGATIVE mg/dL
Nitrite: NEGATIVE

## 2012-04-27 LAB — OB RESULTS CONSOLE GBS: GBS: NEGATIVE

## 2012-04-27 NOTE — Progress Notes (Signed)
Pulse 82 C/o headache.

## 2012-04-27 NOTE — Progress Notes (Signed)
C/o headache =6-7, denies visual changes, trace edema in feet. BP repeated while on NST.

## 2012-04-27 NOTE — Progress Notes (Signed)
Urine not clean catch.  Always has 1+ protein in urine.  GBS and cultures today.  NST reactive with 140 baseline.  No decels.  Bp still elevated after doubling labetalol dose last week.  Will get 24 hour urnine, labs and protein creat. Ratio today.

## 2012-04-28 LAB — PROTEIN / CREATININE RATIO, URINE: Creatinine, Urine: 152.8 mg/dL

## 2012-04-28 LAB — GC/CHLAMYDIA PROBE AMP, URINE: Chlamydia, Swab/Urine, PCR: NEGATIVE

## 2012-04-29 LAB — CULTURE, BETA STREP (GROUP B ONLY)

## 2012-04-30 ENCOUNTER — Ambulatory Visit (INDEPENDENT_AMBULATORY_CARE_PROVIDER_SITE_OTHER): Payer: BC Managed Care – PPO | Admitting: *Deleted

## 2012-04-30 VITALS — BP 143/90

## 2012-04-30 DIAGNOSIS — O139 Gestational [pregnancy-induced] hypertension without significant proteinuria, unspecified trimester: Secondary | ICD-10-CM

## 2012-04-30 LAB — COMPREHENSIVE METABOLIC PANEL
AST: 13 U/L (ref 0–37)
Albumin: 3.5 g/dL (ref 3.5–5.2)
Alkaline Phosphatase: 178 U/L — ABNORMAL HIGH (ref 39–117)
BUN: 4 mg/dL — ABNORMAL LOW (ref 6–23)
Calcium: 9 mg/dL (ref 8.4–10.5)
Chloride: 106 mEq/L (ref 96–112)
Glucose, Bld: 55 mg/dL — ABNORMAL LOW (ref 70–99)
Potassium: 3.7 mEq/L (ref 3.5–5.3)
Sodium: 139 mEq/L (ref 135–145)
Total Protein: 6.7 g/dL (ref 6.0–8.3)

## 2012-04-30 LAB — CBC
HCT: 31 % — ABNORMAL LOW (ref 36.0–46.0)
Hemoglobin: 10.2 g/dL — ABNORMAL LOW (ref 12.0–15.0)
MCHC: 32.9 g/dL (ref 30.0–36.0)
RDW: 15 % (ref 11.5–15.5)
WBC: 11.6 10*3/uL — ABNORMAL HIGH (ref 4.0–10.5)

## 2012-04-30 NOTE — Progress Notes (Signed)
NST 04/30/12 reactive 

## 2012-04-30 NOTE — Progress Notes (Signed)
P = 90  Pt has H/A today- just started prior to arrival to clinic. She denies visual changes.  Tylenol 1000 mg given po. Pt brought 24 hr urine today, labs drawn.   [place] growth scheduled 7/15 @ 1045

## 2012-05-01 ENCOUNTER — Encounter: Payer: Self-pay | Admitting: Obstetrics & Gynecology

## 2012-05-01 LAB — CREATININE CLEARANCE, URINE, 24 HOUR: Creatinine, 24H Ur: 1614 mg/d (ref 700–1800)

## 2012-05-01 LAB — PROTEIN, URINE, 24 HOUR: Protein, 24H Urine: 280 mg/d — ABNORMAL HIGH (ref 50–100)

## 2012-05-04 ENCOUNTER — Ambulatory Visit (HOSPITAL_COMMUNITY)
Admission: RE | Admit: 2012-05-04 | Discharge: 2012-05-04 | Disposition: A | Discharge status: Home / Self Care | Payer: BC Managed Care – PPO | Source: Ambulatory Visit | Attending: Obstetrics & Gynecology | Admitting: Obstetrics & Gynecology

## 2012-05-04 ENCOUNTER — Encounter: Payer: Self-pay | Admitting: Obstetrics & Gynecology

## 2012-05-04 ENCOUNTER — Ambulatory Visit (INDEPENDENT_AMBULATORY_CARE_PROVIDER_SITE_OTHER): Payer: BC Managed Care – PPO | Admitting: Obstetrics & Gynecology

## 2012-05-04 VITALS — BP 144/83 | Temp 97.9°F | Wt 196.0 lb

## 2012-05-04 DIAGNOSIS — O0993 Supervision of high risk pregnancy, unspecified, third trimester: Secondary | ICD-10-CM

## 2012-05-04 DIAGNOSIS — O10019 Pre-existing essential hypertension complicating pregnancy, unspecified trimester: Secondary | ICD-10-CM | POA: Insufficient documentation

## 2012-05-04 DIAGNOSIS — Z3689 Encounter for other specified antenatal screening: Secondary | ICD-10-CM | POA: Insufficient documentation

## 2012-05-04 DIAGNOSIS — O169 Unspecified maternal hypertension, unspecified trimester: Secondary | ICD-10-CM

## 2012-05-04 DIAGNOSIS — O139 Gestational [pregnancy-induced] hypertension without significant proteinuria, unspecified trimester: Secondary | ICD-10-CM

## 2012-05-04 LAB — POCT URINALYSIS DIP (DEVICE)
Glucose, UA: NEGATIVE mg/dL
Hgb urine dipstick: NEGATIVE
Ketones, ur: NEGATIVE mg/dL
Specific Gravity, Urine: 1.015 (ref 1.005–1.030)

## 2012-05-04 NOTE — Progress Notes (Signed)
[place] growth today @ 1045.   Pt states she has been having H/A's- tylenol does not relieve the pain.

## 2012-05-04 NOTE — Progress Notes (Signed)
Pulse 79 Patient reports pelvic pressure and pain with walking

## 2012-05-04 NOTE — Progress Notes (Signed)
NST reactive.  BP stable at this time.  144/83.  Growth [place] today with fluid.  Urine is not clean catch.

## 2012-05-07 ENCOUNTER — Other Ambulatory Visit: Payer: BC Managed Care – PPO

## 2012-05-08 ENCOUNTER — Telehealth (HOSPITAL_COMMUNITY): Payer: Self-pay | Admitting: *Deleted

## 2012-05-08 ENCOUNTER — Encounter (HOSPITAL_COMMUNITY): Payer: Self-pay | Admitting: *Deleted

## 2012-05-08 ENCOUNTER — Ambulatory Visit (INDEPENDENT_AMBULATORY_CARE_PROVIDER_SITE_OTHER): Payer: BC Managed Care – PPO | Admitting: *Deleted

## 2012-05-08 VITALS — BP 158/88 | Wt 196.2 lb

## 2012-05-08 DIAGNOSIS — O139 Gestational [pregnancy-induced] hypertension without significant proteinuria, unspecified trimester: Secondary | ICD-10-CM

## 2012-05-08 NOTE — Telephone Encounter (Signed)
Preadmission screen  

## 2012-05-08 NOTE — Progress Notes (Signed)
P = 83      Pt states she took her labetalol @ 0820 today.  Pt states she continues to have sporadic H/A's- pain scale 3 now.  She denies visual changes, dizziness or epigastric pain.  0935-Pt sent to lobby after NST- will re-check BP in 45 min.  1020- BP= 158/88, pt reports H/A is now gone. All results reported to Dr. Erin Fulling.  Pt d/c to home and instructed to return to hospital if she has severe or persistent H/A, visual changes or swelling of face and/or hands.  Pt voiced understanding.

## 2012-05-08 NOTE — Progress Notes (Deleted)
P-83 

## 2012-05-10 ENCOUNTER — Other Ambulatory Visit: Payer: Self-pay | Admitting: Family Medicine

## 2012-05-11 ENCOUNTER — Other Ambulatory Visit: Payer: Self-pay | Admitting: *Deleted

## 2012-05-11 ENCOUNTER — Ambulatory Visit (INDEPENDENT_AMBULATORY_CARE_PROVIDER_SITE_OTHER): Payer: BC Managed Care – PPO | Admitting: Obstetrics & Gynecology

## 2012-05-11 VITALS — BP 142/84 | Temp 98.6°F | Wt 194.9 lb

## 2012-05-11 DIAGNOSIS — I10 Essential (primary) hypertension: Secondary | ICD-10-CM

## 2012-05-11 DIAGNOSIS — O0990 Supervision of high risk pregnancy, unspecified, unspecified trimester: Secondary | ICD-10-CM

## 2012-05-11 DIAGNOSIS — O10019 Pre-existing essential hypertension complicating pregnancy, unspecified trimester: Secondary | ICD-10-CM

## 2012-05-11 DIAGNOSIS — O139 Gestational [pregnancy-induced] hypertension without significant proteinuria, unspecified trimester: Secondary | ICD-10-CM

## 2012-05-11 LAB — POCT URINALYSIS DIP (DEVICE)
Ketones, ur: NEGATIVE mg/dL
Protein, ur: 30 mg/dL — AB
Specific Gravity, Urine: 1.02 (ref 1.005–1.030)
Urobilinogen, UA: 1 mg/dL (ref 0.0–1.0)
pH: 7 (ref 5.0–8.0)

## 2012-05-11 MED ORDER — PRENATAL MULTIVITAMIN CH: 1.0000 | ORAL_TABLET | Freq: Every day | ORAL | Status: DC

## 2012-05-11 NOTE — Progress Notes (Signed)
IOL scheduled on 05/15/12.  7/15 EFW 2906g/47%, AFI 6.25, cephalic.  AFI today improved at 9.4 cm. NST performed today was reviewed and was found to be reactive.  Continue recommended antenatal testing and prenatal care; follow up AFI this week. No other complaints or concerns.  Fetal movement, blood pressure and labor precautions reviewed.

## 2012-05-11 NOTE — Progress Notes (Signed)
P= 88 Recheck BP = 145/95 Pt denies HA, vision changes or blurred vision

## 2012-05-11 NOTE — Patient Instructions (Signed)
Labor Induction  Most women go into labor on their own between 37 and 42 weeks of the pregnancy. When this does not happen or when there is a medical need, medicine or other methods may be used to induce labor. Labor induction causes a pregnant woman's uterus to contract. It also causes the cervix to soften (ripen), open (dilate), and thin out (efface). Usually, labor is not induced before 39 weeks of the pregnancy unless there is a problem with the baby or mother. Whether your labor will be induced depends on a number of factors, including the following:  The medical condition of you and the baby.   How many weeks along you are.   The status of baby's lung maturity.   The condition of the cervix.   The position of the baby.  REASONS FOR LABOR INDUCTION  The health of the baby or mother is at risk.   The pregnancy is overdue by 1 week or more.   The water breaks but labor does not start on its own.   The mother has a health condition or serious illness such as high blood pressure, infection, placental abruption, or diabetes.   The amniotic fluid amounts are low around the baby.   The baby is distressed.  REASONS TO NOT INDUCE LABOR Labor induction may not be a good idea if:  It is shown that your baby does not tolerate labor.   An induction is just more convenient.   You want the baby to be born on a certain date, like a holiday.   You have had previous surgeries on your uterus, such as a myomectomy or the removal of fibroids.   Your placenta lies very low in the uterus and blocks the opening of the cervix (placenta previa).   Your baby is not in a head down position.   The umbilical cord drops down into the birth canal in front of the baby. This could cut off the baby's blood and oxygen supply.   You have had a previous cesarean delivery.   There areunusual circumstances, such as the baby being extremely premature.  RISKS AND COMPLICATIONS Problems may occur in the  process of induction and plans may need to be modified as a situation unfolds. Some of the risks of induction include:  Change in fetal heart rate, such as too high, too low, or erratic.   Risk of fetal distress.   Risk of infection to mother and baby.   Increased chance of having a cesarean delivery.   The rare, but increased chance that the placenta will separate from the uterus (abruption).   Uterine rupture (very rare).  When induction is needed for medical reasons, the benefits of induction may outweigh the risks. BEFORE THE PROCEDURE Your caregiver will check your cervix and the baby's position. This will help your caregiver decide if you are far enough along for an induction to work. PROCEDURE Several methods of labor induction may be used, such as:   Taking prostaglandin medicine to dilate and ripen the cervix. The medicine will also start contractions. It can be taken by mouth or by inserting a suppository into the vagina.   A thin tube (catheter) with a balloon on the end may be inserted into your vagina to dilate the cervix. Once inserted, the balloon expands with water, which causes the cervix to open.   Striping the membranes. Your caregiver inserts a finger between the cervix and membranes, which causes the cervix to be stretched and   may cause the uterus to contract. This is often done during an office visit. You will be sent home to wait for the contractions to begin. You will then come in for an induction.   Breaking the water. Your caregiver will make a hole in the amniotic sac using a small instrument. Once the amniotic sac breaks, contractions should begin. This may still take hours to see an effect.   Taking medicine to trigger or strengthen contractions. This medicine is given intravenously through a tube in your arm.  All of the methods of induction, besides stripping the membranes, will be done in the hospital. Induction is done in the hospital so that you and the  baby can be carefully monitored. AFTER THE PROCEDURE Some inductions can take up to 2 or 3 days. Depending on the cervix, it usually takes less time. It takes longer when you are induced early in the pregnancy or if this is your first pregnancy. If a mother is still pregnant and the induction has been going on for 2 to 3 days, either the mother will be sent home or a cesarean delivery will be needed. Document Released: 02/26/2007 Document Revised: 09/26/2011 Document Reviewed: 08/12/2011 Exit[employer] Patient Information 2012 [employer]. 

## 2012-05-15 ENCOUNTER — Inpatient Hospital Stay (HOSPITAL_COMMUNITY)
Admission: RE | Admit: 2012-05-15 | Discharge: 2012-05-19 | DRG: 372 | Disposition: A | Discharge status: Home / Self Care | Payer: BC Managed Care – PPO | Source: Ambulatory Visit | Attending: Obstetrics & Gynecology | Admitting: Obstetrics & Gynecology

## 2012-05-15 ENCOUNTER — Encounter (HOSPITAL_COMMUNITY): Payer: Self-pay

## 2012-05-15 DIAGNOSIS — O1002 Pre-existing essential hypertension complicating childbirth: Principal | ICD-10-CM | POA: Diagnosis present

## 2012-05-15 DIAGNOSIS — R51 Headache: Secondary | ICD-10-CM | POA: Diagnosis not present

## 2012-05-15 DIAGNOSIS — O99893 Other specified diseases and conditions complicating puerperium: Secondary | ICD-10-CM | POA: Diagnosis not present

## 2012-05-15 LAB — COMPREHENSIVE METABOLIC PANEL
AST: 13 U/L (ref 0–37)
Albumin: 3.1 g/dL — ABNORMAL LOW (ref 3.5–5.2)
Calcium: 9.4 mg/dL (ref 8.4–10.5)
Creatinine, Ser: 0.53 mg/dL (ref 0.50–1.10)

## 2012-05-15 LAB — CBC
HCT: 33.6 % — ABNORMAL LOW (ref 36.0–46.0)
MCH: 27 pg (ref 26.0–34.0)
MCHC: 32.1 g/dL (ref 30.0–36.0)
MCV: 84 fL (ref 78.0–100.0)
Platelets: 294 10*3/uL (ref 150–400)
RDW: 14.3 % (ref 11.5–15.5)

## 2012-05-15 LAB — PROTEIN / CREATININE RATIO, URINE
Protein Creatinine Ratio: 0.39 — ABNORMAL HIGH (ref 0.00–0.15)
Total Protein, Urine: 11.9 mg/dL

## 2012-05-15 LAB — TYPE AND SCREEN: Antibody Screen: NEGATIVE

## 2012-05-15 MED ORDER — LABETALOL HCL 200 MG PO TABS
400.0000 mg | ORAL_TABLET | Freq: Two times a day (BID) | ORAL | Status: DC
  Administered 2012-05-15 (×2): 400 mg via ORAL
  Filled 2012-05-15 (×3): qty 2

## 2012-05-15 MED ORDER — BUTORPHANOL TARTRATE 1 MG/ML IJ SOLN
1.0000 mg | INTRAMUSCULAR | Status: DC | PRN
  Administered 2012-05-15 (×2): 2 mg via INTRAVENOUS
  Filled 2012-05-15 (×3): qty 2

## 2012-05-15 MED ORDER — TERBUTALINE SULFATE 1 MG/ML IJ SOLN: 0.2500 mg | Freq: Once | INTRAMUSCULAR | Status: AC | PRN

## 2012-05-15 MED ORDER — ONDANSETRON HCL 4 MG/2ML IJ SOLN
4.0000 mg | Freq: Four times a day (QID) | INTRAMUSCULAR | Status: DC | PRN
  Administered 2012-05-15 – 2012-05-16 (×2): 4 mg via INTRAVENOUS
  Filled 2012-05-15 (×2): qty 2

## 2012-05-15 MED ORDER — LIDOCAINE HCL (PF) 1 % IJ SOLN
30.0000 mL | INTRAMUSCULAR | Status: DC | PRN
  Filled 2012-05-15 (×2): qty 30

## 2012-05-15 MED ORDER — HYDRALAZINE HCL 20 MG/ML IJ SOLN: 10.0000 mg | INTRAMUSCULAR | Status: DC | PRN

## 2012-05-15 MED ORDER — CITRIC ACID-SODIUM CITRATE 334-500 MG/5ML PO SOLN
30.0000 mL | ORAL | Status: DC | PRN
  Administered 2012-05-15: 30 mL via ORAL
  Filled 2012-05-15: qty 15

## 2012-05-15 MED ORDER — IBUPROFEN 600 MG PO TABS
600.0000 mg | ORAL_TABLET | Freq: Four times a day (QID) | ORAL | Status: DC | PRN
  Administered 2012-05-16: 600 mg via ORAL
  Filled 2012-05-15: qty 1

## 2012-05-15 MED ORDER — ACETAMINOPHEN 325 MG PO TABS: 650.0000 mg | ORAL_TABLET | ORAL | Status: DC | PRN

## 2012-05-15 MED ORDER — OXYCODONE-ACETAMINOPHEN 5-325 MG PO TABS
1.0000 | ORAL_TABLET | ORAL | Status: DC | PRN
Start: 2012-05-15 — End: 2012-05-16

## 2012-05-15 MED ORDER — LACTATED RINGERS IV SOLN
INTRAVENOUS | Status: DC
  Administered 2012-05-15: 21:00:00 via INTRAVENOUS
  Administered 2012-05-15: 1000 mL via INTRAVENOUS
  Administered 2012-05-16: 02:00:00 via INTRAVENOUS

## 2012-05-15 MED ORDER — OXYTOCIN 40 UNITS IN LACTATED RINGERS INFUSION - SIMPLE MED
1.0000 m[IU]/min | INTRAVENOUS | Status: DC
Start: 2012-05-15 — End: 2012-05-16
  Administered 2012-05-15: 12 m[IU]/min via INTRAVENOUS
  Administered 2012-05-15: 10 m[IU]/min via INTRAVENOUS
  Administered 2012-05-15: 6 m[IU]/min via INTRAVENOUS
  Administered 2012-05-15: 2 m[IU]/min via INTRAVENOUS
  Administered 2012-05-15: 8 m[IU]/min via INTRAVENOUS
  Administered 2012-05-15: 2 m[IU]/min via INTRAVENOUS
  Administered 2012-05-15: 4 m[IU]/min via INTRAVENOUS

## 2012-05-15 MED ORDER — LACTATED RINGERS IV SOLN
500.0000 mL | INTRAVENOUS | Status: DC | PRN
  Administered 2012-05-16: 500 mL via INTRAVENOUS

## 2012-05-15 MED ORDER — HYDRALAZINE HCL 20 MG/ML IJ SOLN
20.0000 mg | INTRAMUSCULAR | Status: DC | PRN
  Administered 2012-05-15: 20 mg via INTRAVENOUS
  Filled 2012-05-15: qty 1

## 2012-05-15 MED ORDER — MAGNESIUM SULFATE BOLUS VIA INFUSION
4.0000 g | Freq: Once | INTRAVENOUS | Status: AC
  Administered 2012-05-15: 4 g via INTRAVENOUS
  Filled 2012-05-15: qty 500

## 2012-05-15 MED ORDER — FENTANYL CITRATE 0.05 MG/ML IJ SOLN
100.0000 ug | INTRAMUSCULAR | Status: DC | PRN
  Administered 2012-05-15: 100 ug via INTRAVENOUS
  Filled 2012-05-15: qty 2

## 2012-05-15 MED ORDER — FLEET ENEMA 7-19 GM/118ML RE ENEM: 1.0000 | ENEMA | RECTAL | Status: DC | PRN

## 2012-05-15 MED ORDER — OXYTOCIN BOLUS FROM INFUSION
250.0000 mL | Freq: Once | INTRAVENOUS | Status: AC
  Administered 2012-05-16: 250 mL via INTRAVENOUS
  Filled 2012-05-15: qty 500

## 2012-05-15 MED ORDER — OXYTOCIN 40 UNITS IN LACTATED RINGERS INFUSION - SIMPLE MED
62.5000 mL/h | Freq: Once | INTRAVENOUS | Status: DC
  Filled 2012-05-15: qty 1000

## 2012-05-15 MED ORDER — MAGNESIUM SULFATE 40 G IN LACTATED RINGERS - SIMPLE
2.0000 g/h | INTRAVENOUS | Status: DC
  Filled 2012-05-15: qty 500

## 2012-05-15 MED ORDER — HYDRALAZINE HCL 20 MG/ML IJ SOLN: 20.0000 mg | Freq: Once | INTRAMUSCULAR | Status: DC

## 2012-05-15 NOTE — Progress Notes (Signed)
NST 04-09-12 reactive

## 2012-05-15 NOTE — H&P (Signed)
Marie Lawrence is a 31 y.o. female G2P0101 at 40.0 EGA presenting for induction of labor for [facility]. History Patient currently does not have any contractions.  No loss of fluid or bleeding.  States baby is moving normally.  Patient is on Labetalol 400 mg BID for HTN.  Had not taken today's dose. OB History    Grav Para Term Preterm Abortions TAB SAB Ect Mult Living   2 1 0 1 0 0 0 0 0 1      Past Medical History  Diagnosis Date  . Hypertension   . Ovarian cyst    Past Surgical History  Procedure Date  . Cesarean section 2000   Family History: family history includes Diabetes in her father and maternal aunt; Hypertension in her maternal aunt and maternal grandmother; and Kidney disease in her father.  There is no history of Anesthesia problems, and Hypotension, and Malignant hyperthermia, and Pseudochol deficiency, . Social History:  reports that she has been smoking Cigarettes.  She has a 6 pack-year smoking history. She has never used smokeless tobacco. She reports that she does not drink alcohol or use illicit drugs.   Prenatal Transfer Tool  Maternal Diabetes: No Genetic Screening: Normal Maternal Ultrasounds/Referrals: Normal Fetal Ultrasounds or other Referrals:  None Maternal Substance Abuse:  No Significant Maternal Medications:  None Significant Maternal Lab Results:  Lab values include: Group B Strep negative Other Comments:  None  Review of Systems  Constitutional: Negative for fever and chills.  Eyes: Negative for blurred vision.  Respiratory: Negative for shortness of breath.   Cardiovascular: Negative for chest pain.  Neurological: Negative for dizziness and headaches.    Dilation: 2.5 Effacement (%): 70 Station: 0 Exam by:: d herr rn Blood pressure 164/99, temperature 98 F (36.7 C), temperature source Oral, last menstrual period 08/16/2011. Exam Physical Exam  Constitutional: She is oriented to person, place, and time. She appears well-developed and  well-nourished.  HENT:  Head: Normocephalic and atraumatic.  Cardiovascular: Normal rate, regular rhythm and normal heart sounds.   Respiratory: Effort normal and breath sounds normal.  GI: Soft. There is no tenderness.       Gravid abdomen  Neurological: She is alert and oriented to person, place, and time.  Psychiatric: She has a normal mood and affect.     FHT: 140, moderate, accels present, decels absent, no contractions  Prenatal labs: ABO, Rh: O/POS/-- (11/30 1151) Antibody: NEG (11/30 1151) Rubella: 87.4 (11/30 1151) RPR: NON REAC (05/06 0915)  HBsAg: NEGATIVE (11/30 1151)  HIV: NON REACTIVE (05/06 0915)  GBS: Negative (07/08 0000)   Assessment/Plan: Patient is a G2P0101 at 39.0 EGA presenting for induction of labor.  Will proceed with induction. Cervix at 2.5 cm so will start pitocin and monitor progression. Labetalol 400 mg BID for HTN. Will support through labor.   Marikay Alar 05/15/2012, 9:16 AM

## 2012-05-15 NOTE — Progress Notes (Signed)
Subjective: Moderate contractions.  Foley balloon still inplace  Objective: BP 133/72  Pulse 80  Temp 97.7 F (36.5 C) (Axillary)  Resp 20  Ht 5\' 5"  (1.651 m)  Wt 88.451 kg (195 lb)  BMI 32.45 kg/m2  LMP 08/16/2011      FHT:  FHR: 125 bpm, variability: moderate,  accelerations:  Present,  decelerations:  Absent UC:   regular, every 3-5 minutes SVE:   Dilation: 3 Effacement (%): 60 Station: -2 Exam by:: Dr Debroah Loop, Dr Birdie Sons  Labs: Lab Results  Component Value Date   WBC 14.0* 05/15/2012   HGB 10.8* 05/15/2012   HCT 33.6* 05/15/2012   MCV 84.0 05/15/2012   PLT 294 05/15/2012    Assessment / Plan: Category 1 tracing.  Continue pitocin at low dose and foley balloon.  STINSON, JACOB JEHIEL 05/15/2012, 8:57 PM

## 2012-05-15 NOTE — Progress Notes (Signed)
Subjective: Patient doing well. No complaints at this time  Objective: BP 179/92  Pulse 73  Temp 98 F (36.7 C) (Oral)  Resp 20  LMP 08/16/2011      FHT:  FHR: 135 bpm, variability: moderate,  accelerations:  Abscent,  decelerations:  Absent UC:   Very few contractions visualized, 2 seen in last 30 minutes SVE:   Dilation: 2.5 Effacement (%): 70 Station: 0 Exam by:: d herr rn  Labs: Lab Results  Component Value Date   WBC 14.0* 05/15/2012   HGB 10.8* 05/15/2012   HCT 33.6* 05/15/2012   MCV 84.0 05/15/2012   PLT 294 05/15/2012    Assessment / Plan: Continue current management of labor.  Pitocin at 6 milliunits.   Marikay Alar 05/15/2012, 12:32 PM

## 2012-05-15 NOTE — Progress Notes (Signed)
Marie Lawrence is a 31 y.o. G2P0101 at [redacted]w[redacted]d by LMP admitted for induction of labor due to Hypertension.  Subjective:   Objective: BP 158/84  Pulse 63  Temp 97.7 F (36.5 C) (Oral)  Resp 18  Ht 5\' 5"  (1.651 m)  Wt 88.451 kg (195 lb)  BMI 32.45 kg/m2  LMP 08/16/2011      FHT:  FHR: 140 bpm, variability: moderate,  accelerations:  Present,  decelerations:  Absent UC:   regular, every 3 minutes SVE:   Dilation: 2.5 Effacement (%): 80 Station: -2 Exam by:: d herr rn 60/3/-2  Foley placed Labs: Lab Results  Component Value Date   WBC 14.0* 05/15/2012   HGB 10.8* 05/15/2012   HCT 33.6* 05/15/2012   MCV 84.0 05/15/2012   PLT 294 05/15/2012    Assessment / Plan: Induction of labor due to chronic HTN,  progressing well on pitocin  Labor: Progressing on Pitocin, will continue to increase then AROM Preeclampsia:  follow BP and check labs Fetal Wellbeing:  Category I Pain Control:  Stadol I/D:  n/a Anticipated MOD:  NSVD, VBAC  Clint Strupp 05/15/2012, 4:42 PM

## 2012-05-15 NOTE — Progress Notes (Signed)
Subjective: In some pain. Just received stadol.  Objective: BP 186/92  Pulse 69  Temp 97.9 F (36.6 C) (Oral)  Resp 18  Ht 5\' 5"  (1.651 m)  Wt 88.451 kg (195 lb)  BMI 32.45 kg/m2  LMP 08/16/2011      FHT:  FHR: 135 bpm, variability: moderate,  accelerations:  Present,  decelerations:  Present one variable seen UC:   none SVE:   Dilation: 2.5 Effacement (%): 80 Station: -2 Exam by:: d herr rn  Labs: Lab Results  Component Value Date   WBC 14.0* 05/15/2012   HGB 10.8* 05/15/2012   HCT 33.6* 05/15/2012   MCV 84.0 05/15/2012   PLT 294 05/15/2012    Assessment / Plan: Induction of labor progressing well. Continue pitocin. Stadol for pain.  Marie Lawrence 05/15/2012, 3:06 PM

## 2012-05-15 NOTE — H&P (Signed)
Patient seen and examined.  Agree with above note.  Levie Heritage, DO 05/15/2012 10:33 AM

## 2012-05-16 ENCOUNTER — Encounter (HOSPITAL_COMMUNITY): Payer: Self-pay | Admitting: Anesthesiology

## 2012-05-16 ENCOUNTER — Encounter (HOSPITAL_COMMUNITY): Payer: Self-pay

## 2012-05-16 ENCOUNTER — Inpatient Hospital Stay (HOSPITAL_COMMUNITY): Payer: BC Managed Care – PPO | Admitting: Anesthesiology

## 2012-05-16 DIAGNOSIS — O9989 Other specified diseases and conditions complicating pregnancy, childbirth and the puerperium: Secondary | ICD-10-CM

## 2012-05-16 DIAGNOSIS — O99893 Other specified diseases and conditions complicating puerperium: Secondary | ICD-10-CM

## 2012-05-16 LAB — CBC
HCT: 31.5 % — ABNORMAL LOW (ref 36.0–46.0)
Hemoglobin: 10.3 g/dL — ABNORMAL LOW (ref 12.0–15.0)
MCHC: 32.7 g/dL (ref 30.0–36.0)
MCV: 83.3 fL (ref 78.0–100.0)

## 2012-05-16 LAB — RPR: RPR Ser Ql: NONREACTIVE

## 2012-05-16 LAB — MRSA PCR SCREENING: MRSA by PCR: NEGATIVE

## 2012-05-16 MED ORDER — MAGNESIUM SULFATE 40 G IN LACTATED RINGERS - SIMPLE
2.0000 g/h | INTRAVENOUS | Status: DC
  Administered 2012-05-16: 2 g/h via INTRAVENOUS
  Filled 2012-05-16 (×2): qty 500

## 2012-05-16 MED ORDER — DIBUCAINE 1 % RE OINT
1.0000 | TOPICAL_OINTMENT | RECTAL | Status: DC | PRN
Start: 2012-05-16 — End: 2012-05-19

## 2012-05-16 MED ORDER — LANOLIN HYDROUS EX OINT: TOPICAL_OINTMENT | CUTANEOUS | Status: DC | PRN

## 2012-05-16 MED ORDER — ONDANSETRON HCL 4 MG PO TABS: 4.0000 mg | ORAL_TABLET | ORAL | Status: DC | PRN

## 2012-05-16 MED ORDER — SIMETHICONE 80 MG PO CHEW: 80.0000 mg | CHEWABLE_TABLET | ORAL | Status: DC | PRN

## 2012-05-16 MED ORDER — LACTATED RINGERS IV SOLN
500.0000 mL | Freq: Once | INTRAVENOUS | Status: AC
  Administered 2012-05-16: 500 mL via INTRAVENOUS

## 2012-05-16 MED ORDER — SENNOSIDES-DOCUSATE SODIUM 8.6-50 MG PO TABS
2.0000 | ORAL_TABLET | Freq: Every day | ORAL | Status: DC
  Administered 2012-05-16 – 2012-05-18 (×3): 2 via ORAL

## 2012-05-16 MED ORDER — TETANUS-DIPHTH-ACELL PERTUSSIS 5-2.5-18.5 LF-MCG/0.5 IM SUSP
0.5000 mL | Freq: Once | INTRAMUSCULAR | Status: DC
  Filled 2012-05-16: qty 0.5

## 2012-05-16 MED ORDER — LIDOCAINE HCL (PF) 1 % IJ SOLN
INTRAMUSCULAR | Status: DC | PRN
  Administered 2012-05-16: 4 mL
  Administered 2012-05-16: 30 mL via SUBCUTANEOUS
  Administered 2012-05-16: 4 mL

## 2012-05-16 MED ORDER — LACTATED RINGERS IV SOLN
INTRAVENOUS | Status: DC
  Administered 2012-05-16 – 2012-05-17 (×2): via INTRAVENOUS

## 2012-05-16 MED ORDER — EPHEDRINE 5 MG/ML INJ
10.0000 mg | INTRAVENOUS | Status: DC | PRN
  Filled 2012-05-16: qty 4

## 2012-05-16 MED ORDER — IBUPROFEN 600 MG PO TABS
600.0000 mg | ORAL_TABLET | Freq: Four times a day (QID) | ORAL | Status: DC
  Administered 2012-05-16 – 2012-05-19 (×12): 600 mg via ORAL
  Filled 2012-05-16 (×12): qty 1

## 2012-05-16 MED ORDER — PHENYLEPHRINE 40 MCG/ML (10ML) SYRINGE FOR IV PUSH (FOR BLOOD PRESSURE SUPPORT): 80.0000 ug | PREFILLED_SYRINGE | INTRAVENOUS | Status: DC | PRN

## 2012-05-16 MED ORDER — WITCH HAZEL-GLYCERIN EX PADS: 1.0000 "application " | MEDICATED_PAD | CUTANEOUS | Status: DC | PRN

## 2012-05-16 MED ORDER — PHENYLEPHRINE 40 MCG/ML (10ML) SYRINGE FOR IV PUSH (FOR BLOOD PRESSURE SUPPORT)
80.0000 ug | PREFILLED_SYRINGE | INTRAVENOUS | Status: DC | PRN
  Filled 2012-05-16: qty 5

## 2012-05-16 MED ORDER — DIPHENHYDRAMINE HCL 50 MG/ML IJ SOLN: 12.5000 mg | INTRAMUSCULAR | Status: DC | PRN

## 2012-05-16 MED ORDER — HYDROCHLOROTHIAZIDE 25 MG PO TABS
25.0000 mg | ORAL_TABLET | Freq: Every day | ORAL | Status: DC
  Administered 2012-05-16: 25 mg via ORAL
  Filled 2012-05-16 (×2): qty 1

## 2012-05-16 MED ORDER — ZOLPIDEM TARTRATE 5 MG PO TABS: 5.0000 mg | ORAL_TABLET | Freq: Every evening | ORAL | Status: DC | PRN

## 2012-05-16 MED ORDER — BENZOCAINE-MENTHOL 20-0.5 % EX AERO
1.0000 | INHALATION_SPRAY | CUTANEOUS | Status: DC | PRN
Start: 2012-05-16 — End: 2012-05-19
  Filled 2012-05-16: qty 56

## 2012-05-16 MED ORDER — ONDANSETRON HCL 4 MG/2ML IJ SOLN: 4.0000 mg | INTRAMUSCULAR | Status: DC | PRN

## 2012-05-16 MED ORDER — OXYCODONE-ACETAMINOPHEN 5-325 MG PO TABS
1.0000 | ORAL_TABLET | ORAL | Status: DC | PRN
  Administered 2012-05-17: 1 via ORAL
  Administered 2012-05-18 (×2): 2 via ORAL
  Administered 2012-05-18 – 2012-05-19 (×2): 1 via ORAL
  Filled 2012-05-16 (×2): qty 1
  Filled 2012-05-16 (×2): qty 2
  Filled 2012-05-16: qty 1

## 2012-05-16 MED ORDER — FENTANYL 2.5 MCG/ML BUPIVACAINE 1/10 % EPIDURAL INFUSION (WH - ANES)
INTRAMUSCULAR | Status: DC | PRN
  Administered 2012-05-16: 14 mL/h via EPIDURAL

## 2012-05-16 MED ORDER — FENTANYL 2.5 MCG/ML BUPIVACAINE 1/10 % EPIDURAL INFUSION (WH - ANES)
14.0000 mL/h | INTRAMUSCULAR | Status: DC
  Administered 2012-05-16 (×2): 14 mL/h via EPIDURAL
  Filled 2012-05-16 (×3): qty 60

## 2012-05-16 MED ORDER — PRENATAL MULTIVITAMIN CH
1.0000 | ORAL_TABLET | Freq: Every day | ORAL | Status: DC
  Administered 2012-05-17 – 2012-05-19 (×3): 1 via ORAL
  Filled 2012-05-16 (×3): qty 1

## 2012-05-16 MED ORDER — EPHEDRINE 5 MG/ML INJ: 10.0000 mg | INTRAVENOUS | Status: DC | PRN

## 2012-05-16 MED ORDER — DIPHENHYDRAMINE HCL 25 MG PO CAPS: 25.0000 mg | ORAL_CAPSULE | Freq: Four times a day (QID) | ORAL | Status: DC | PRN

## 2012-05-16 NOTE — Progress Notes (Signed)
Dr. Adrian Blackwater notified of FHR pattern.  New order received to decrease pitocin back to 3 milliunits/min.  Patient advised of plan of care.

## 2012-05-16 NOTE — Progress Notes (Signed)
Subjective: Patient comfortable.  Had prolonged decel with recovery after turning patient.  Objective: BP 154/84  Pulse 69  Temp 97.9 F (36.6 C) (Oral)  Resp 20  Ht 5\' 5"  (1.651 m)  Wt 88.451 kg (195 lb)  BMI 32.45 kg/m2  LMP 08/16/2011   Total I/O In: 914.4 [P.O.:180; I.V.:734.4] Out: 555 [Urine:555]  FHT:  FHR: 120 bpm, variability: minimal ,  accelerations:  Present,  decelerations:  Present early UC:   regular, every 3 minutes SVE:   Dilation: 5.5 Effacement (%): 70 Station: 0 Exam by:: A.Davis,RN  Labs: Lab Results  Component Value Date   WBC 14.0* 05/15/2012   HGB 10.8* 05/15/2012   HCT 33.6* 05/15/2012   MCV 84.0 05/15/2012   PLT 294 05/15/2012    Assessment / Plan: Category 2 tracing.  AROM with clear fluid.  FSE and IUPC placed.  Continue close monitoring of FHT.  Restart pitocin.  Marie Lawrence JEHIEL 05/16/2012, 4:22 AM

## 2012-05-16 NOTE — Anesthesia Procedure Notes (Signed)
Epidural Patient location during procedure: OB Start time: 05/16/2012 12:43 AM  Staffing Anesthesiologist: Cherina Dhillon A. Performed by: anesthesiologist   Preanesthetic Checklist Completed: patient identified, site marked, surgical consent, pre-op evaluation, timeout performed, IV checked, risks and benefits discussed and monitors and equipment checked  Epidural Patient position: sitting Prep: site prepped and draped and DuraPrep Patient monitoring: continuous pulse ox and blood pressure Approach: midline Injection technique: LOR air  Needle:  Needle type: Tuohy  Needle gauge: 17 G Needle length: 9 cm Needle insertion depth: 7 cm Catheter type: closed end flexible Catheter size: 19 Gauge Catheter at skin depth: 12 cm Test dose: negative and Other  Assessment Events: blood not aspirated, injection not painful, no injection resistance, negative IV test and no paresthesia  Additional Notes Patient identified. Risks and benefits discussed including failed block, incomplete  Pain control, post dural puncture headache, nerve damage, paralysis, blood pressure Changes, nausea, vomiting, reactions to medications-both toxic and allergic and post Partum back pain. All questions were answered. Patient expressed understanding and wished to proceed. Sterile technique was used throughout procedure. Epidural site was Dressed with sterile barrier dressing. No paresthesias, signs of intravascular injection Or signs of intrathecal spread were encountered.  Patient was more comfortable after the epidural was dosed. Please see RN's note for documentation of vital signs and FHR which are stable.

## 2012-05-16 NOTE — Anesthesia Preprocedure Evaluation (Signed)
Anesthesia Evaluation  Patient identified by MRN, date of birth, ID band Patient awake    Reviewed: Allergy & Precautions, H&P , Patient's Chart, lab work & pertinent test results  Airway Mallampati: III TM Distance: >3 FB Neck ROM: full    Dental No notable dental hx. (+) Teeth Intact   Pulmonary neg pulmonary ROS,  breath sounds clear to auscultation  Pulmonary exam normal       Cardiovascular hypertension, On Medications and On Home Beta Blockers Rhythm:regular Rate:Normal  Superimposed Precclampsia   Neuro/Psych negative neurological ROS  negative psych ROS   GI/Hepatic negative GI ROS, Neg liver ROS,   Endo/Other  negative endocrine ROS  Renal/GU negative Renal ROS  negative genitourinary   Musculoskeletal   Abdominal Normal abdominal exam  (+)   Peds  Hematology negative hematology ROS (+)   Anesthesia Other Findings   Reproductive/Obstetrics (+) Pregnancy                           Anesthesia Physical Anesthesia Plan  ASA: III  Anesthesia Plan: Epidural   Post-op Pain Management:    Induction:   Airway Management Planned:   Additional Equipment:   Intra-op Plan:   Post-operative Plan:   Informed Consent: I have reviewed the patients History and Physical, chart, labs and discussed the procedure including the risks, benefits and alternatives for the proposed anesthesia with the patient or authorized representative who has indicated his/her understanding and acceptance.     Plan Discussed with: Anesthesiologist  Anesthesia Plan Comments:         Anesthesia Quick Evaluation

## 2012-05-16 NOTE — Anesthesia Postprocedure Evaluation (Signed)
  Anesthesia Post-op Note  Patient: Marie Lawrence  Procedure(s) Performed: * Lumbar Epidural for L&D *  Patient Location: [facility]  Anesthesia Type: Epidural  Complications: No apparent anesthesia complications

## 2012-05-16 NOTE — Progress Notes (Signed)
  Subjective: Pt resting comfortably with epidural.  Objective: BP 122/70  Pulse 68  Temp 97.9 F (36.6 C) (Oral)  Resp 18  Ht 5\' 5"  (1.651 m)  Wt 88.451 kg (195 lb)  BMI 32.45 kg/m2  LMP 08/16/2011   Total I/O In: 321.9 [P.O.:60; I.V.:261.9] Out: 325 [Urine:325]  FHT:  FHR: 130 bpm, variability: moderate,  accelerations:  Abscent,  decelerations:  Absent UC:   regular, every 2-3 minutes SVE:   Dilation: 5 Effacement (%): 70 Station: -1 Exam by:: A.Davis,RN  Labs: Lab Results  Component Value Date   WBC 14.0* 05/15/2012   HGB 10.8* 05/15/2012   HCT 33.6* 05/15/2012   MCV 84.0 05/15/2012   PLT 294 05/15/2012    Assessment / Plan: Category 1 tracing.  Magnesium started for preeclampsia.  Continue to titrate pitocin.    Marie Lawrence 05/16/2012, 2:13 AM

## 2012-05-17 LAB — CBC
Hemoglobin: 9.5 g/dL — ABNORMAL LOW (ref 12.0–15.0)
MCH: 27.5 pg (ref 26.0–34.0)
MCHC: 32.6 g/dL (ref 30.0–36.0)
Platelets: 246 10*3/uL (ref 150–400)

## 2012-05-17 MED ORDER — HYDROCHLOROTHIAZIDE 50 MG PO TABS
50.0000 mg | ORAL_TABLET | Freq: Every day | ORAL | Status: DC
  Administered 2012-05-17 – 2012-05-19 (×3): 50 mg via ORAL
  Filled 2012-05-17 (×4): qty 1

## 2012-05-17 MED ORDER — LABETALOL HCL 200 MG PO TABS
400.0000 mg | ORAL_TABLET | Freq: Two times a day (BID) | ORAL | Status: DC
  Administered 2012-05-17 – 2012-05-19 (×4): 400 mg via ORAL
  Filled 2012-05-17 (×7): qty 2

## 2012-05-17 NOTE — Progress Notes (Signed)
Pt sitting up eating and visiting.

## 2012-05-17 NOTE — Addendum Note (Signed)
Addendum  created 05/17/12 0945 by Lincoln Brigham, CRNA   Modules edited:Charges VN, Notes Section

## 2012-05-17 NOTE — Anesthesia Postprocedure Evaluation (Signed)
  Anesthesia Post-op Note  Patient: Marie Lawrence  Procedure(s) Performed: * No procedures listed *  Patient Location: Mother/Baby  Anesthesia Type: Epidural  Level of Consciousness: awake, alert  and oriented  Airway and Oxygen Therapy: Patient Spontanous Breathing  Post-op Pain: mild  Post-op Assessment: Patient's Cardiovascular Status Stable, Respiratory Function Stable, Patent Airway, No signs of Nausea or vomiting and Pain level controlled  Post-op Vital Signs: stable  Complications: No apparent anesthesia complications

## 2012-05-17 NOTE — Progress Notes (Signed)
Patient ID: Marie Lawrence, female   DOB: Nov 03, 1980, 31 y.o.   MRN: [id number] PPD # 1  S. No complaints  O. VSS, AF 160s/90s     I/Os -1700     Heart-rrr     Lungs-CTAB     Uterus- U-1  A/P. PPD #1- stable     I will increase her HCTZ to 50 mg daily and d/c the mag and transfer to the floor.

## 2012-05-17 NOTE — Progress Notes (Signed)
05/17/12 1400  Vitals  BP ! 168/107 mmHg  MAP (mmHg) 123    MD aware of continued hypertension-will call back with POC.

## 2012-05-17 NOTE — Progress Notes (Signed)
Pt breastfeeding, BP cuff bent up on arm, not an accurate BP reading

## 2012-05-17 NOTE — Progress Notes (Signed)
Pt just returned from bathroom

## 2012-05-18 ENCOUNTER — Encounter (HOSPITAL_COMMUNITY): Payer: Self-pay

## 2012-05-18 MED ORDER — ENALAPRIL MALEATE 10 MG PO TABS
10.0000 mg | ORAL_TABLET | ORAL | Status: AC
  Administered 2012-05-18: 10 mg via ORAL
  Filled 2012-05-18: qty 1

## 2012-05-18 MED ORDER — BUTALBITAL-APAP-CAFFEINE 50-325-40 MG PO TABS
1.0000 | ORAL_TABLET | ORAL | Status: DC | PRN
  Administered 2012-05-18: 1 via ORAL
  Filled 2012-05-18: qty 1

## 2012-05-18 MED ORDER — LABETALOL HCL 200 MG PO TABS: 400.0000 mg | ORAL_TABLET | Freq: Three times a day (TID) | ORAL | Status: DC

## 2012-05-18 MED ORDER — IBUPROFEN 600 MG PO TABS: 600.0000 mg | ORAL_TABLET | Freq: Four times a day (QID) | ORAL | Status: AC

## 2012-05-18 MED ORDER — LABETALOL HCL 200 MG PO TABS
400.0000 mg | ORAL_TABLET | Freq: Once | ORAL | Status: AC
  Administered 2012-05-18: 400 mg via ORAL

## 2012-05-18 MED ORDER — HYDROCHLOROTHIAZIDE 50 MG PO TABS: 50.0000 mg | ORAL_TABLET | Freq: Every day | ORAL | Status: DC

## 2012-05-18 MED ORDER — BUTALBITAL-APAP-CAFFEINE 50-325-40 MG PO TABS: 1.0000 | ORAL_TABLET | ORAL | Status: AC | PRN

## 2012-05-18 NOTE — Consults (Signed)
SUBJECTIVE: Marie Lawrence is a 31 y.o. female who complains of headaches s/p delivery under epidural anesthesia. Description of pain: postural with worsening when standing or sitting up in bed, mildly worse with bright lights.  No fevers, point tenderness, nuchal rigidity, or diplopia.   She rates them as fluctuating from 6/10-8/10 with no other associated exacerbating factors or alleviating factors. Of note, she has had edema and difficult to control hypertension. Her urine protein was 11.9 on 7/26.  She denies any history of recent head injury. By review her epidural was routine.  Prior neurological history: negative   Neurologic Review of Systems - negative  Scheduled Meds:   . hydrochlorothiazide  50 mg Oral Daily  . ibuprofen  600 mg Oral Q6H  . labetalol  400 mg Oral BID  . prenatal multivitamin  1 tablet Oral Daily  . senna-docusate  2 tablet Oral QHS   Continuous Infusions:  PRN Meds:benzocaine-Menthol, dibucaine, diphenhydrAMINE, lanolin, ondansetron (ZOFRAN) IV, ondansetron, oxyCODONE-acetaminophen, simethicone, witch hazel-glycerin, zolpidem   OBJECTIVE: Vitals: 151/99.  Afebrile. Appearance: :"alert, well appearing, and in no distress"  Sitting in bed breat feeding.  She smiled with greeting. Neurological Exam: alert, oriented, normal speech, no focal findings or movement disorder noted   ASSESSMENT: Possible mild post dural puncture headache  PLAN: Recommendations: 1) Discussed symptomatic control such as avoiding bright light and postural worsening factors. We also discussed treatment options and the patients would prefer to try conservative therapy, as she states that the headaches "really aren't that bad".  We discussed Cosyntropin, caffeine, Fioricet, as well as blood patch.  Side effect profiles were discussed in detail.  She would like to start by trying Fioricet and potentially trying Cosyntropin if that does not reasonably well control her headaches.  She knows  that she can return to [facility] for re-evaluation should the headaches persist.  She also knows that if she has worsening of symptoms, to return for evaluation.  Her questions were answered and this plan was discussed.  I spent 30 minutes on the evaluation and management of this patient.  Thank you for the consult

## 2012-05-18 NOTE — Progress Notes (Signed)
Post Partum Day 2 Subjective: up ad lib, voiding and tolerating PO C/O severe headache when sits up or stands up. First Labetalol was at 2230  Objective: Blood pressure 145/78, pulse 73, temperature 98.5 F (36.9 C), temperature source Oral, resp. rate 20, height 5\' 5"  (1.651 m), weight 195 lb 4.8 oz (88.587 kg), last menstrual period 08/16/2011, SpO2 98.00%, unknown if currently breastfeeding. BP this morning 170/101  Filed Vitals:   05/17/12 1829 05/17/12 2200 05/18/12 0030 05/18/12 0215  BP: 169/100 160/92 155/94 145/78  Pulse: 67 85 82 73  Temp: 98.3 F (36.8 C) 98.5 F (36.9 C)    TempSrc: Oral Oral    Resp: 16 20 20 20   Height:      Weight:      SpO2: 98%       Physical Exam:  General: alert, cooperative and mild distress  With headache when up Lochia: appropriate Uterine Fundus: firm Incision: healing well DVT Evaluation: No evidence of DVT seen on physical exam. DTRs 2+/ no clonus Trace Edema   Basename 05/17/12 0520 05/16/12 0827  HGB 9.5* 10.3*  HCT 29.1* 31.5*    Assessment/Plan:  Consult Anesthesia Re: possible spinal headache  Plan for discharge tomorrow unless we see improvement today. Would like to d/c today. Will discuss with team this morning   LOS: 3 days   [facility] 05/18/2012, 6:28 AM

## 2012-05-18 NOTE — Discharge Summary (Signed)
Obstetric Discharge Summary Reason for Admission: induction of labor for gestational hypertension Prenatal Procedures: NST Intrapartum Procedures: spontaneous vaginal delivery Postpartum Procedures: none Complications-Operative and Postpartum: none Hemoglobin  Date Value Range Status  05/17/2012 9.5* 12.0 - 15.0 g/dL Final     HCT  Date Value Range Status  05/17/2012 29.1* 36.0 - 46.0 % Final    Physical Exam:  General: alert, cooperative and no distress Lochia: appropriate Uterine Fundus: firm DVT Evaluation: No evidence of DVT seen on physical exam. Negative Homan's sign. No cords or calf tenderness. No significant calf/ankle edema.  Discharge Diagnoses: Term Pregnancy-delivered  Discharge Information: Date: 05/18/2012 Activity: pelvic rest Diet: routine Medications: PNV, Ibuprofen and fiorecet, labetalol, HCTZ Condition: stable Instructions: refer to practice specific booklet Discharge to: home Follow-up Information    Follow up with [facility]. Schedule an appointment as soon as possible for a visit in 3 days. (blood pressure check)    Contact information:   [address] [place] [address]          Newborn Data: Live born female  Birth Weight: 5 lb 14.4 oz (2675 g) APGAR: 8, 9  Home with mother.  Marie Lawrence 05/18/2012, 6:49 PM

## 2012-05-18 NOTE — Plan of Care (Signed)
Problem: Phase II Progression Outcomes Goal: Afebrile, VS remain stable Outcome: Not Met (add Reason) Elevated bp

## 2012-05-19 MED ORDER — LABETALOL HCL 100 MG PO TABS: 400.0000 mg | ORAL_TABLET | Freq: Three times a day (TID) | ORAL | Status: DC

## 2012-05-19 MED ORDER — ENALAPRIL MALEATE 10 MG PO TABS: 10.0000 mg | ORAL_TABLET | Freq: Every day | ORAL | Status: DC

## 2012-05-19 NOTE — Discharge Summary (Signed)
Obstetric Discharge Summary Reason for Admission: induction of labor Prenatal Procedures: none Intrapartum Procedures: spontaneous vaginal delivery Postpartum Procedures: Magnesium Complications-Operative and Postpartum: 2nd degree perineal laceration Hemoglobin  Date Value Range Status  05/17/2012 9.5* 12.0 - 15.0 g/dL Final     HCT  Date Value Range Status  05/17/2012 29.1* 36.0 - 46.0 % Final    Physical Exam:  General: alert and cooperative Lochia: appropriate Uterine Fundus: firm Incision: n/a DVT Evaluation: No evidence of DVT seen on physical exam. Negative Homan's sign. No cords or calf tenderness. No significant calf/ankle edema.  Discharge Diagnoses: Term Pregnancy-delivered  Discharge Information: Date: 05/19/2012 Activity: unrestricted Diet: routine Medications: PNV and Ibuprofen Condition: stable Instructions: refer to practice specific booklet Discharge to: home Follow-up Information    Follow up with [facility]. Schedule an appointment as soon as possible for a visit in 3 days. (blood pressure check)    Contact information:   [address] [place] [address]          Newborn Data: Live born female  Birth Weight: 5 lb 14.4 oz (2675 g) APGAR: 8, 9  Home with mother.  Marikay Alar 05/19/2012, 8:03 AM

## 2012-05-19 NOTE — Discharge Summary (Addendum)
I examined pt and agree with documentation above and resident plan of care. [facility]  In addition will add HCTZ to discharge medication.

## 2012-05-19 NOTE — Discharge Summary (Signed)
Obstetric Discharge Summary Reason for Admission: induction of labor Prenatal Procedures: Preeclampsia Intrapartum Procedures: spontaneous vaginal delivery Postpartum Procedures: magnesium Complications-Operative and Postpartum: none Hemoglobin  Date Value Range Status  05/17/2012 9.5* 12.0 - 15.0 g/dL Final     HCT  Date Value Range Status  05/17/2012 29.1* 36.0 - 46.0 % Final    Physical Exam:  General: alert and cooperative Lochia: appropriate Uterine Fundus: firm Incision: n/a DVT Evaluation: No evidence of DVT seen on physical exam. Negative Homan's sign. No cords or calf tenderness. No significant calf/ankle edema.  Discharge Diagnoses: Term Pregnancy-delivered  Discharge Information: Date: 05/19/2012 Activity: unrestricted Diet: routine Medications: PNV, Ibuprofen and enalapril Condition: stable Instructions: refer to practice specific booklet Discharge to: home Follow-up Information    Follow up with [facility]. Schedule an appointment as soon as possible for a visit in 3 days. (blood pressure check)    Contact information:   [address] [place] [address]          Newborn Data: Live born female  Birth Weight: 5 lb 14.4 oz (2675 g) APGAR: 8, 9  Home with mother.  Marikay Alar 05/19/2012, 7:45 AM

## 2012-05-21 NOTE — Discharge Summary (Signed)
Attestation of Attending Supervision of Resident: Evaluation and management procedures were performed by the [facility] Resident under my supervision.  I have seen and examined the patient, reviewed the resident's note and chart, and I agree with the management and plan.  Anibal Henderson, M.D. 05/21/2012 1:54 PM

## 2012-05-22 ENCOUNTER — Ambulatory Visit (INDEPENDENT_AMBULATORY_CARE_PROVIDER_SITE_OTHER): Payer: BC Managed Care – PPO

## 2012-05-22 VITALS — BP 148/88 | HR 84

## 2012-05-22 DIAGNOSIS — I10 Essential (primary) hypertension: Secondary | ICD-10-CM

## 2012-05-22 NOTE — Progress Notes (Signed)
Pt came in for BP check.  Pt stated that she was prescribed 3 medications and didn't know what to take.  I informed pt that the 3 medications that were chosen by the provider work together to give her (the pt) the best possible outcome for her BP.  Pt stated that she had only taken her labetalol this am and not the Hydrodiuril 50mg  qd nor Enalapril 10mg  qd.  Pt stated understanding to taking the BP medications as prescribed and had no further questions.  Per Wynelle Bourgeois, pt is ok to go home and to continue to take medications as prescribed. I advised pt to go to a [facility] to random checks of BP, make sure she makes time to take BP medication as ordered, and verified with pt her PP appt scheduled for 06/12/12 @ 0845. Pt stated understanding.

## 2012-05-28 ENCOUNTER — Other Ambulatory Visit: Payer: Self-pay | Admitting: Family Medicine

## 2012-05-28 MED ORDER — LABETALOL HCL 100 MG PO TABS: 400.0000 mg | ORAL_TABLET | Freq: Three times a day (TID) | ORAL | Status: DC

## 2012-05-28 NOTE — Telephone Encounter (Signed)
Received page about confirmation of dose required by pharmacy.  Re-prescribed Labetolol 400 mg TID with 3 refills.

## 2012-06-12 ENCOUNTER — Ambulatory Visit (INDEPENDENT_AMBULATORY_CARE_PROVIDER_SITE_OTHER): Payer: BC Managed Care – PPO | Admitting: Obstetrics & Gynecology

## 2012-06-12 ENCOUNTER — Encounter: Payer: Self-pay | Admitting: Obstetrics & Gynecology

## 2012-06-12 VITALS — BP 146/99 | HR 80 | Temp 98.5°F | Ht 65.5 in | Wt 178.6 lb

## 2012-06-12 DIAGNOSIS — Z3049 Encounter for surveillance of other contraceptives: Secondary | ICD-10-CM

## 2012-06-12 DIAGNOSIS — Z3042 Encounter for surveillance of injectable contraceptive: Secondary | ICD-10-CM

## 2012-06-12 DIAGNOSIS — I10 Essential (primary) hypertension: Secondary | ICD-10-CM

## 2012-06-12 MED ORDER — MEDROXYPROGESTERONE ACETATE 150 MG/ML IM SUSP
150.0000 mg | INTRAMUSCULAR | Status: AC
  Administered 2012-06-12: 150 mg via INTRAMUSCULAR

## 2012-06-12 NOTE — Progress Notes (Signed)
Patient ID: Marie Lawrence, female   DOB: [birth year], 31 y.o.   MRN: [id number] Subjective:     Marie Lawrence is a 31 y.o. female who presents for a postpartum visit. She is 4 weeks postpartum following a spontaneous vaginal delivery. I have fully reviewed the prenatal and intrapartum course. The delivery was at 39 gestational weeks. Outcome: spontaneous vaginal delivery. Anesthesia: epidural. Postpartum course has been normal. Baby's course has been normal. Baby is feeding by breast. Bleeding no bleeding. Bowel function is normal. Bladder function is normal. Patient is not sexually active. Contraception method is Depo-Provera injections. Postpartum depression screening: negative.  The following portions of the patient's history were reviewed and updated as appropriate: allergies, current medications, past family history, past medical history, past social history, past surgical history and problem list.  Review of Systems Pertinent items are noted in HPI.   Objective:    BP 146/99  Pulse 80  Temp 98.5 F (36.9 C) (Oral)  Ht 5' 5.5" (1.664 m)  Wt 178 lb 9.6 oz (81.012 kg)  BMI 29.27 kg/m2  Breastfeeding? Yes  General:  alert, cooperative and no distress   Breasts:    Lungs:   Heart:    Abdomen: soft, non-tender; bowel sounds normal; no masses,  no organomegaly   Vulva:  not evaluated  Vagina: not evaluated  Cervix:    Corpus: not examined  Adnexa:  not evaluated  Rectal Exam: Not performed.        Assessment:    Normal postpartum exam. Pap smear not done at today's visit.  Hypertension Plan:    1. Contraception: Depo-Provera injections 2. F/U at [facility] for hypertension management   3. Follow up in:   or as needed.   ARNOLD,JAMES 06/12/2012 9:42 AM

## 2012-06-12 NOTE — Patient Instructions (Signed)
Smoking Cessation This document explains the best ways for you to quit smoking and new treatments to help. It lists new medicines that can double or triple your chances of quitting and quitting for good. It also considers ways to avoid relapses and concerns you may have about quitting, including weight gain. NICOTINE: A POWERFUL ADDICTION If you have tried to quit smoking, you know how hard it can be. It is hard because nicotine is a very addictive drug. For some people, it can be as addictive as heroin or cocaine. Usually, people make 2 or 3 tries, or more, before finally being able to quit. Each time you try to quit, you can learn about what helps and what hurts. Quitting takes hard work and a lot of effort, but you can quit smoking. QUITTING SMOKING IS ONE OF THE MOST IMPORTANT THINGS YOU WILL EVER DO.  You will live longer, feel better, and live better.   The impact on your body of quitting smoking is felt almost immediately:   Within 20 minutes, blood pressure decreases. Pulse returns to its normal level.   After 8 hours, carbon monoxide levels in the blood return to normal. Oxygen level increases.   After 24 hours, chance of heart attack starts to decrease. Breath, hair, and body stop smelling like smoke.   After 48 hours, damaged nerve endings begin to recover. Sense of taste and smell improve.   After 72 hours, the body is virtually free of nicotine. Bronchial tubes relax and breathing becomes easier.   After 2 to 12 weeks, lungs can hold more air. Exercise becomes easier and circulation improves.   Quitting will reduce your risk of having a heart attack, stroke, cancer, or lung disease:   After 1 year, the risk of coronary heart disease is cut in half.   After 5 years, the risk of stroke falls to the same as a nonsmoker.   After 10 years, the risk of lung cancer is cut in half and the risk of other cancers decreases significantly.   After 15 years, the risk of coronary heart  disease drops, usually to the level of a nonsmoker.   If you are pregnant, quitting smoking will improve your chances of having a healthy baby.   The people you live with, especially your children, will be healthier.   You will have extra money to spend on things other than cigarettes.  FIVE KEYS TO QUITTING Studies have shown that these 5 steps will help you quit smoking and quit for good. You have the best chances of quitting if you use them together: 1. Get ready.  2. Get support and encouragement.  3. Learn new skills and behaviors.  4. Get medicine to reduce your nicotine addiction and use it correctly.  5. Be prepared for relapse or difficult situations. Be determined to continue trying to quit, even if you do not succeed at first.  1. GET READY  Set a quit date.   Change your environment.   Get rid of ALL cigarettes, ashtrays, matches, and lighters in your home, car, and place of work.   Do not let people smoke in your home.   Review your past attempts to quit. Think about what worked and what did not.   Once you quit, do not smoke. NOT EVEN A PUFF!  2. GET SUPPORT AND ENCOURAGEMENT Studies have shown that you have a better chance of being successful if you have help. You can get support in many ways.  Tell   your family, friends, and coworkers that you are going to quit and need their support. Ask them not to smoke around you.   Talk to your caregivers (doctor, dentist, nurse, pharmacist, psychologist, and/or smoking counselor).   Get individual, group, or telephone counseling and support. The more counseling you have, the better your chances are of quitting. Programs are available at local hospitals and health centers. Call your local health department for information about programs in your area.   Spiritual beliefs and practices may help some smokers quit.   Quit meters are small computer programs online or downloadable that keep track of quit statistics, such as amount  of "quit-time," cigarettes not smoked, and money saved.   Many smokers find one or more of the many self-help books available useful in helping them quit and stay off tobacco.  3. LEARN NEW SKILLS AND BEHAVIORS  Try to distract yourself from urges to smoke. Talk to someone, go for a walk, or occupy your time with a task.   When you first try to quit, change your routine. Take a different route to work. Drink tea instead of coffee. Eat breakfast in a different place.   Do something to reduce your stress. Take a hot bath, exercise, or read a book.   Plan something enjoyable to do every day. Reward yourself for not smoking.   Explore interactive web-based programs that specialize in helping you quit.  4. GET MEDICINE AND USE IT CORRECTLY Medicines can help you stop smoking and decrease the urge to smoke. Combining medicine with the above behavioral methods and support can quadruple your chances of successfully quitting smoking. The U.[place]. Food and Drug Administration (FDA) has approved 7 medicines to help you quit smoking. These medicines fall into 3 categories.  Nicotine replacement therapy (delivers nicotine to your body without the negative effects and risks of smoking):   Nicotine gum: Available over-the-counter.   Nicotine lozenges: Available over-the-counter.   Nicotine inhaler: Available by prescription.   Nicotine nasal spray: Available by prescription.   Nicotine skin patches (transdermal): Available by prescription and over-the-counter.   Antidepressant medicine (helps people abstain from smoking, but how this works is unknown):   Bupropion sustained-release (SR) tablets: Available by prescription.   Nicotinic receptor partial agonist (simulates the effect of nicotine in your brain):   Varenicline tartrate tablets: Available by prescription.   Ask your caregiver for advice about which medicines to use and how to use them. Carefully read the information on the package.    Everyone who is trying to quit may benefit from using a medicine. If you are pregnant or trying to become pregnant, nursing an infant, you are under age 18, or you smoke fewer than 10 cigarettes per day, talk to your caregiver before taking any nicotine replacement medicines.   You should stop using a nicotine replacement product and call your caregiver if you experience nausea, dizziness, weakness, vomiting, fast or irregular heartbeat, mouth problems with the lozenge or gum, or redness or swelling of the skin around the patch that does not go away.   Do not use any other product containing nicotine while using a nicotine replacement product.   Talk to your caregiver before using these products if you have diabetes, heart disease, asthma, stomach ulcers, you had a recent heart attack, you have high blood pressure that is not controlled with medicine, a history of irregular heartbeat, or you have been prescribed medicine to help you quit smoking.  5. BE PREPARED FOR RELAPSE OR   DIFFICULT SITUATIONS  Most relapses occur within the first 3 months after quitting. Do not be discouraged if you start smoking again. Remember, most people try several times before they finally quit.   You may have symptoms of withdrawal because your body is used to nicotine. You may crave cigarettes, be irritable, feel very hungry, cough often, get headaches, or have difficulty concentrating.   The withdrawal symptoms are only temporary. They are strongest when you first quit, but they will go away within 10 to 14 days.  Here are some difficult situations to watch for:  Alcohol. Avoid drinking alcohol. Drinking lowers your chances of successfully quitting.   Caffeine. Try to reduce the amount of caffeine you consume. It also lowers your chances of successfully quitting.   Other smokers. Being around smoking can make you want to smoke. Avoid smokers.   Weight gain. Many smokers will gain weight when they quit, usually  less than 10 pounds. Eat a healthy diet and stay active. Do not let weight gain distract you from your main goal, quitting smoking. Some medicines that help you quit smoking may also help delay weight gain. You can always lose the weight gained after you quit.   Bad mood or depression. There are a lot of ways to improve your mood other than smoking.  If you are having problems with any of these situations, talk to your caregiver. SPECIAL SITUATIONS AND CONDITIONS Studies suggest that everyone can quit smoking. Your situation or condition can give you a special reason to quit.  Pregnant women/new mothers: By quitting, you protect your baby's health and your own.   Hospitalized patients: By quitting, you reduce health problems and help healing.   Heart attack patients: By quitting, you reduce your risk of a second heart attack.   Lung, head, and neck cancer patients: By quitting, you reduce your chance of a second cancer.   Parents of children and adolescents: By quitting, you protect your children from illnesses caused by secondhand smoke.  QUESTIONS TO THINK ABOUT Think about the following questions before you try to stop smoking. You may want to talk about your answers with your caregiver.  Why do you want to quit?   If you tried to quit in the past, what helped and what did not?   What will be the most difficult situations for you after you quit? How will you plan to handle them?   Who can help you through the tough times? Your family? Friends? Caregiver?   What pleasures do you get from smoking? What ways can you still get pleasure if you quit?  Here are some questions to ask your caregiver:  How can you help me to be successful at quitting?   What medicine do you think would be best for me and how should I take it?   What should I do if I need more help?   What is smoking withdrawal like? How can I get information on withdrawal?  Quitting takes hard work and a lot of effort,  but you can quit smoking. FOR MORE INFORMATION  Smokefree.gov (http://www.smokefree.gov) provides free, accurate, evidence-based information and professional assistance to help support the immediate and long-term needs of people trying to quit smoking. Document Released: 10/01/2001 Document Revised: 09/26/2011 Document Reviewed: 07/24/2009 ExitCare Patient Information 2012 [employer].Hypertension As your heart beats, it forces blood through your arteries. This force is your blood pressure. If the pressure is too high, it is called hypertension (HTN) or high blood pressure.   HTN is dangerous because you may have it and not know it. High blood pressure may mean that your heart has to work harder to pump blood. Your arteries may be narrow or stiff. The extra work puts you at risk for heart disease, stroke, and other problems.  Blood pressure consists of two numbers, a higher number over a lower, 110/72, for example. It is stated as "110 over 72." The ideal is below 120 for the top number (systolic) and under 80 for the bottom (diastolic). Write down your blood pressure today. You should pay close attention to your blood pressure if you have certain conditions such as:  Heart failure.   Prior heart attack.   Diabetes   Chronic kidney disease.   Prior stroke.   Multiple risk factors for heart disease.  To see if you have HTN, your blood pressure should be measured while you are seated with your arm held at the level of the heart. It should be measured at least twice. A one-time elevated blood pressure reading (especially in the Emergency Department) does not mean that you need treatment. There may be conditions in which the blood pressure is different between your right and left arms. It is important to see your caregiver soon for a recheck. Most people have essential hypertension which means that there is not a specific cause. This type of high blood pressure may be lowered by changing lifestyle  factors such as:  Stress.   Smoking.   Lack of exercise.   Excessive weight.   Drug/tobacco/alcohol use.   Eating less salt.  Most people do not have symptoms from high blood pressure until it has caused damage to the body. Effective treatment can often prevent, delay or reduce that damage. TREATMENT  When a cause has been identified, treatment for high blood pressure is directed at the cause. There are a large number of medications to treat HTN. These fall into several categories, and your caregiver will help you select the medicines that are best for you. Medications may have side effects. You should review side effects with your caregiver. If your blood pressure stays high after you have made lifestyle changes or started on medicines,   Your medication(s) may need to be changed.   Other problems may need to be addressed.   Be certain you understand your prescriptions, and know how and when to take your medicine.   Be sure to follow up with your caregiver within the time frame advised (usually within two weeks) to have your blood pressure rechecked and to review your medications.   If you are taking more than one medicine to lower your blood pressure, make sure you know how and at what times they should be taken. Taking two medicines at the same time can result in blood pressure that is too low.  SEEK IMMEDIATE MEDICAL CARE IF:  You develop a severe headache, blurred or changing vision, or confusion.   You have unusual weakness or numbness, or a faint feeling.   You have severe chest or abdominal pain, vomiting, or breathing problems.  MAKE SURE YOU:   Understand these instructions.   Will watch your condition.   Will get help right away if you are not doing well or get worse.  Document Released: 10/07/2005 Document Revised: 09/26/2011 Document Reviewed: 05/27/2008 ExitCare Patient Information 2012 [employer]. 

## 2012-06-24 ENCOUNTER — Encounter: Payer: Self-pay | Admitting: *Deleted

## 2012-08-31 ENCOUNTER — Ambulatory Visit (INDEPENDENT_AMBULATORY_CARE_PROVIDER_SITE_OTHER): Payer: BC Managed Care – PPO | Admitting: *Deleted

## 2012-08-31 DIAGNOSIS — Z309 Encounter for contraceptive management, unspecified: Secondary | ICD-10-CM

## 2012-08-31 MED ORDER — MEDROXYPROGESTERONE ACETATE 150 MG/ML IM SUSP
150.0000 mg | Freq: Once | INTRAMUSCULAR | Status: AC
  Administered 2012-08-31: 150 mg via INTRAMUSCULAR

## 2012-08-31 NOTE — Progress Notes (Signed)
Patient in for Depo today. Received depo at her PP visit at [facility] on 06/12/2012.  Consulted with Dr. Earnest Bailey and she gave verbal  order  to give depo today.

## 2012-09-02 ENCOUNTER — Other Ambulatory Visit: Payer: Self-pay | Admitting: Family Medicine

## 2012-09-02 MED ORDER — MEDROXYPROGESTERONE ACETATE 150 MG/ML IM SUSP: 150.0000 mg | INTRAMUSCULAR | Status: AC

## 2012-09-14 ENCOUNTER — Ambulatory Visit: Payer: BC Managed Care – PPO | Admitting: Family Medicine

## 2012-09-21 ENCOUNTER — Ambulatory Visit (INDEPENDENT_AMBULATORY_CARE_PROVIDER_SITE_OTHER): Payer: BC Managed Care – PPO | Admitting: Family Medicine

## 2012-09-21 ENCOUNTER — Encounter: Payer: Self-pay | Admitting: Family Medicine

## 2012-09-21 VITALS — BP 156/116 | HR 87 | Temp 98.8°F | Ht 65.5 in | Wt 185.0 lb

## 2012-09-21 DIAGNOSIS — I10 Essential (primary) hypertension: Secondary | ICD-10-CM

## 2012-09-21 DIAGNOSIS — Z23 Encounter for immunization: Secondary | ICD-10-CM

## 2012-09-21 MED ORDER — HYDROCHLOROTHIAZIDE 25 MG PO TABS: 50.0000 mg | ORAL_TABLET | Freq: Every day | ORAL | Status: DC

## 2012-09-21 MED ORDER — AMLODIPINE BESYLATE 5 MG PO TABS: 5.0000 mg | ORAL_TABLET | Freq: Every day | ORAL | Status: DC

## 2012-09-21 NOTE — Assessment & Plan Note (Addendum)
A: HTN on high dose BB, HCTZ 50 and enalapril 10.  P:  Taper BB to off per instructions.  Decrease HCTZ to 25 mg  Keep enalapril the same.  Add norvasc 5 mg daily F/u in one week.  Check TSH and CMP

## 2012-09-21 NOTE — Patient Instructions (Addendum)
WUJWJX,  Thank you for coming in to see me today.   For your BP control:  We will taper off the labetolol over 10 days.  1. 300 mg TID 2. 300 mg BID 3. 300 mg BID 4. 200 mg BID 5. 200 mg BID 6. 200 mg BID 7. 100 mg BID 8. 100 mg BID 9. 100 mg daily  10. 100 mg daily   F/u in 1 week for BP check and check on how the taper is going. Possible symptoms increase HR, headaches and chest pain.   Start norvasc 5 mg daily today. Decrease HCTZ to 35 mg daily, 1/2 tab or pick up next script.   May check labs via my chart.   Dr. Armen Pickup

## 2012-09-21 NOTE — Progress Notes (Signed)
Subjective:     Patient ID: [name], female   DOB: 1981/01/23, 31 y.o.   MRN: [id number]  HPI 31 yo F presents for f/u visit to discuss the following:  1. HTN: compliant with medications. Getting dizzy, lightheaded and nauseated when she takes labetolol 400 mg TID. Still with elevated BP. Denies frequent HA, blurred vision, SOB and chest pain.   Review of Systems As per HPI     Objective:   Physical Exam BP 156/116  Pulse 87  Temp 98.8 F (37.1 C) (Oral)  Ht 5' 5.5" (1.664 m)  Wt 185 lb (83.915 kg)  BMI 30.32 kg/m2 General appearance: alert, cooperative and no distress Eyes: conjunctivae/corneas clear. PERRL, EOM's intact. Fundi benign. Neck: no adenopathy, no carotid bruit, no JVD, supple, symmetrical, trachea midline and thyroid: symmetrically enlarged and nontender and no nodules.  Lungs: clear to auscultation bilaterally Heart: regular rate and rhythm, S1, S2 normal, no murmur, click, rub or gallop Extremities: extremities normal, atraumatic, no cyanosis or edema    Assessment and Plan:

## 2012-09-22 ENCOUNTER — Other Ambulatory Visit: Payer: Self-pay | Admitting: Family Medicine

## 2012-09-22 ENCOUNTER — Encounter: Payer: Self-pay | Admitting: Family Medicine

## 2012-09-22 LAB — COMPREHENSIVE METABOLIC PANEL
ALT: 19 U/L (ref 0–35)
AST: 13 U/L (ref 0–37)
Alkaline Phosphatase: 79 U/L (ref 39–117)
CO2: 25 mEq/L (ref 19–32)
Creat: 0.72 mg/dL (ref 0.50–1.10)
Sodium: 142 mEq/L (ref 135–145)
Total Bilirubin: 0.4 mg/dL (ref 0.3–1.2)
Total Protein: 7.2 g/dL (ref 6.0–8.3)

## 2012-09-22 LAB — TSH: TSH: 0.704 u[IU]/mL (ref 0.350–4.500)

## 2012-11-20 ENCOUNTER — Ambulatory Visit (INDEPENDENT_AMBULATORY_CARE_PROVIDER_SITE_OTHER): Payer: BC Managed Care – PPO | Admitting: *Deleted

## 2012-11-20 DIAGNOSIS — Z309 Encounter for contraceptive management, unspecified: Secondary | ICD-10-CM

## 2012-11-20 MED ORDER — MEDROXYPROGESTERONE ACETATE 150 MG/ML IM SUSP
150.0000 mg | Freq: Once | INTRAMUSCULAR | Status: AC
  Administered 2012-11-20: 150 mg via INTRAMUSCULAR

## 2012-11-20 NOTE — Progress Notes (Signed)
Next depo due April 18 through Feb 19, 2013.

## 2012-11-27 ENCOUNTER — Other Ambulatory Visit: Payer: Self-pay | Admitting: Family Medicine

## 2013-02-19 ENCOUNTER — Ambulatory Visit (INDEPENDENT_AMBULATORY_CARE_PROVIDER_SITE_OTHER): Payer: BC Managed Care – PPO | Admitting: *Deleted

## 2013-02-19 DIAGNOSIS — Z309 Encounter for contraceptive management, unspecified: Secondary | ICD-10-CM

## 2013-02-19 MED ORDER — MEDROXYPROGESTERONE ACETATE 150 MG/ML IM SUSP
150.0000 mg | Freq: Once | INTRAMUSCULAR | Status: AC
  Administered 2013-02-19: 150 mg via INTRAMUSCULAR

## 2013-02-19 NOTE — Progress Notes (Signed)
Pt here for depo. Depo given RUOQ. Next depo due Jul 18-aug 1 Cyprian Gongaware, Harold Hedge, RN

## 2013-05-06 ENCOUNTER — Ambulatory Visit (INDEPENDENT_AMBULATORY_CARE_PROVIDER_SITE_OTHER): Payer: BC Managed Care – PPO | Admitting: *Deleted

## 2013-05-06 ENCOUNTER — Encounter: Payer: Self-pay | Admitting: Home Health Services

## 2013-05-06 DIAGNOSIS — Z309 Encounter for contraceptive management, unspecified: Secondary | ICD-10-CM

## 2013-05-06 MED ORDER — MEDROXYPROGESTERONE ACETATE 150 MG/ML IM SUSP
150.0000 mg | Freq: Once | INTRAMUSCULAR | Status: AC
  Administered 2013-05-06: 150 mg via INTRAMUSCULAR

## 2013-05-06 NOTE — Progress Notes (Signed)
Pt here for depo. Depo given. Next depo due oct 2 -`16. Wyatt Haste, RN-BSN

## 2013-08-03 ENCOUNTER — Ambulatory Visit (INDEPENDENT_AMBULATORY_CARE_PROVIDER_SITE_OTHER): Payer: BC Managed Care – PPO | Admitting: *Deleted

## 2013-08-03 DIAGNOSIS — Z3009 Encounter for other general counseling and advice on contraception: Secondary | ICD-10-CM

## 2013-08-03 MED ORDER — MEDROXYPROGESTERONE ACETATE 150 MG/ML IM SUSP
150.0000 mg | Freq: Once | INTRAMUSCULAR | Status: AC
  Administered 2013-08-03: 150 mg via INTRAMUSCULAR

## 2013-08-03 NOTE — Progress Notes (Signed)
Patient in today for depo, injection given in right ventrogluteal, patient without complications. Next injection due December 30 - January 13, patient aware.

## 2013-09-29 ENCOUNTER — Ambulatory Visit: Payer: Self-pay | Admitting: Podiatry

## 2013-10-04 ENCOUNTER — Ambulatory Visit: Payer: Self-pay | Admitting: Podiatry

## 2013-10-08 ENCOUNTER — Ambulatory Visit: Payer: Self-pay | Admitting: Podiatry

## 2013-11-02 ENCOUNTER — Ambulatory Visit (INDEPENDENT_AMBULATORY_CARE_PROVIDER_SITE_OTHER): Payer: BC Managed Care – PPO | Admitting: *Deleted

## 2013-11-02 DIAGNOSIS — Z309 Encounter for contraceptive management, unspecified: Secondary | ICD-10-CM

## 2013-11-02 MED ORDER — MEDROXYPROGESTERONE ACETATE 150 MG/ML IM SUSP
150.0000 mg | Freq: Once | INTRAMUSCULAR | Status: AC
  Administered 2013-11-02: 150 mg via INTRAMUSCULAR

## 2013-11-02 NOTE — Progress Notes (Signed)
Patient in today for depo. Injection given in right ventrogluteal, patient without complaints, site unremarkable. Next depo due March 31 - April 14, patient aware.

## 2014-02-08 ENCOUNTER — Ambulatory Visit: Payer: BC Managed Care – PPO

## 2014-02-08 ENCOUNTER — Ambulatory Visit (INDEPENDENT_AMBULATORY_CARE_PROVIDER_SITE_OTHER): Payer: BC Managed Care – PPO | Admitting: *Deleted

## 2014-02-08 DIAGNOSIS — Z309 Encounter for contraceptive management, unspecified: Secondary | ICD-10-CM

## 2014-02-08 LAB — POCT URINE PREGNANCY: PREG TEST UR: NEGATIVE

## 2014-02-08 MED ORDER — MEDROXYPROGESTERONE ACETATE 150 MG/ML IM SUSP
150.0000 mg | Freq: Once | INTRAMUSCULAR | Status: AC
  Administered 2014-02-08: 150 mg via INTRAMUSCULAR

## 2014-02-08 NOTE — Progress Notes (Signed)
   Pt late for Depo Provera injection.  Pregnancy test ordered, results negative. Per pt last sex over 1 week ago.  Pt advised to use another reliable form of birth control x 7 days.  Pt tolerated Depo injection. Depo given Left upper outer quadrant.  Next injection due July 7 - May 10, 2014.  Reminder card given. Clovis PuMartin, Tamika L, RN

## 2014-04-23 IMAGING — US US OB FOLLOW-UP
2 series · 12 of 28 positions shown · non-contrast
Comparison: none

[Series 1: us ob follow up · 2 of 4 slices shown (1 of 2)]
[im 2/4]
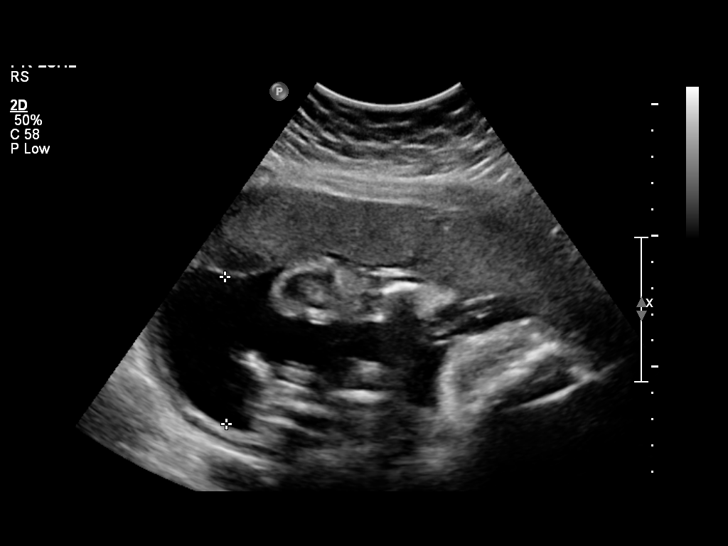
[im 4/4]
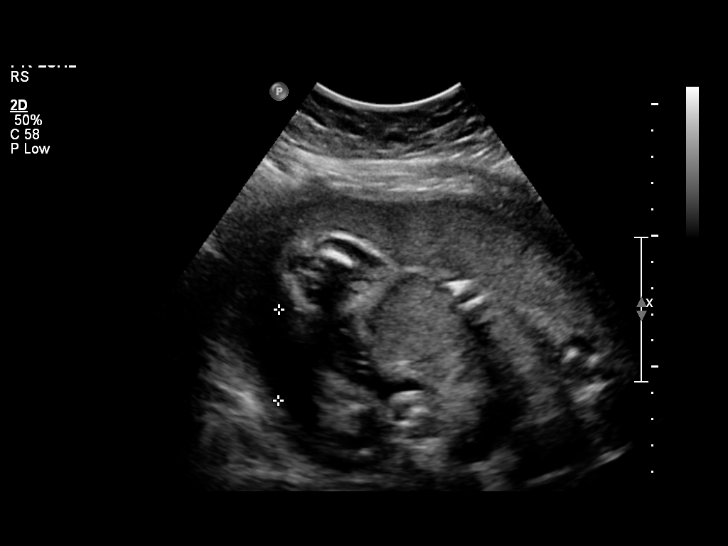

[Series 1: us ob follow up · 10 of 28 slices shown (2 of 2)]
[im 2/28]
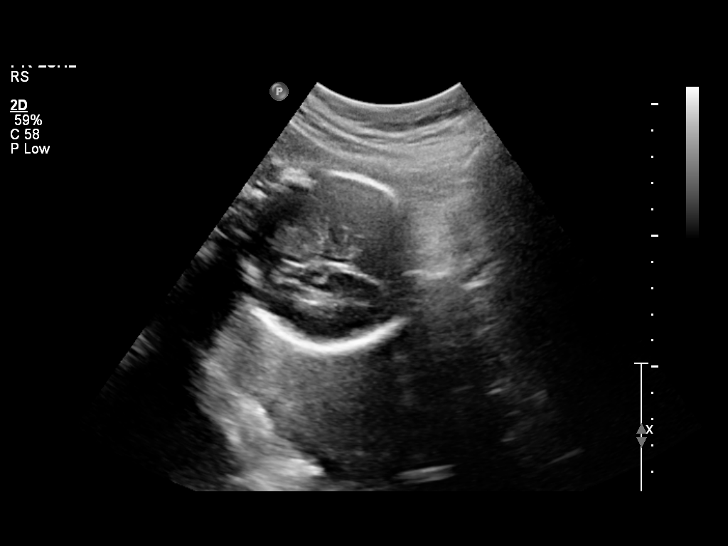
[im 5/28]
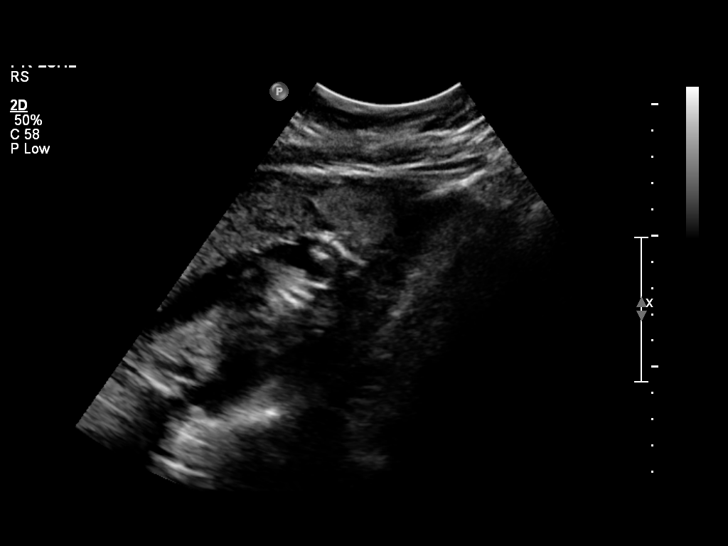
[im 8/28]
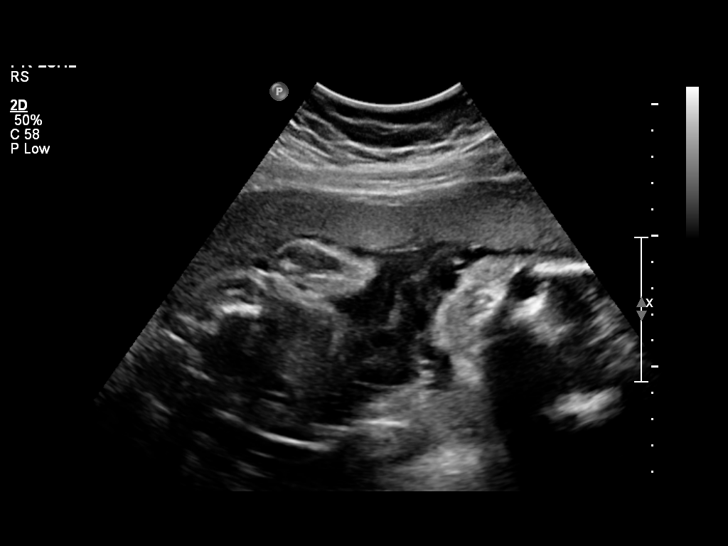
[im 10/28]
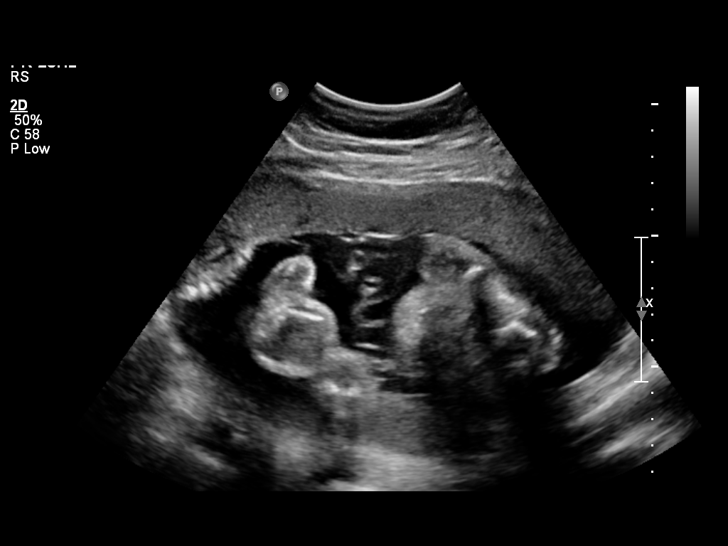
[im 13/28]
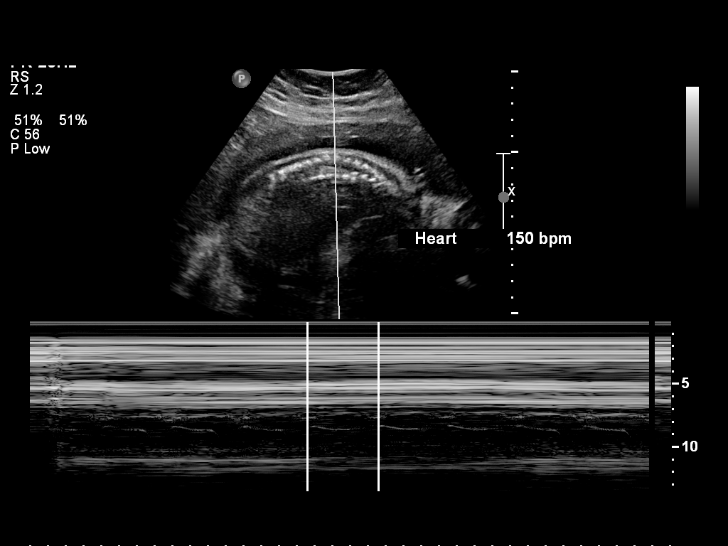
[im 16/28]
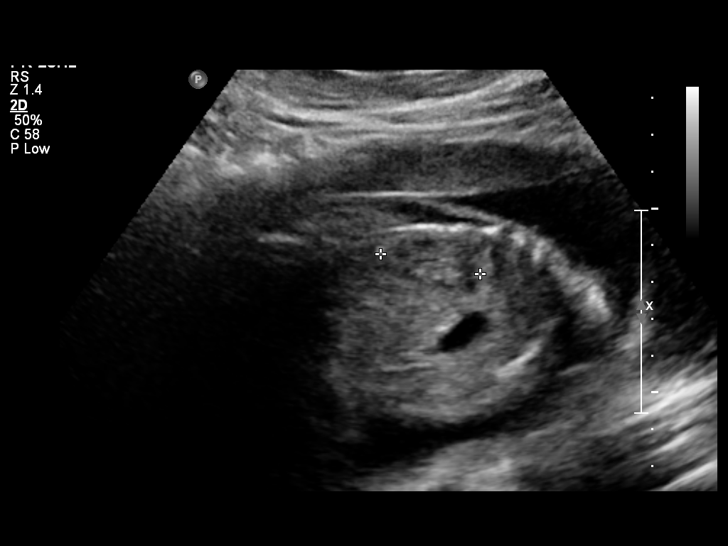
[im 18/28]
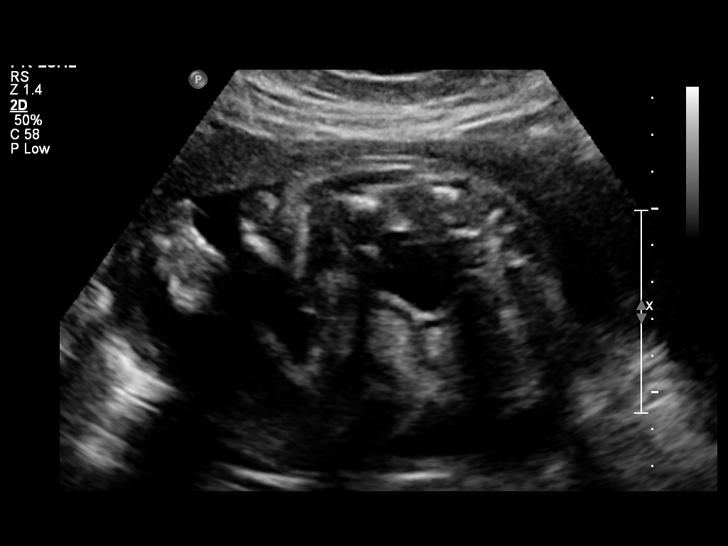
[im 22/28]
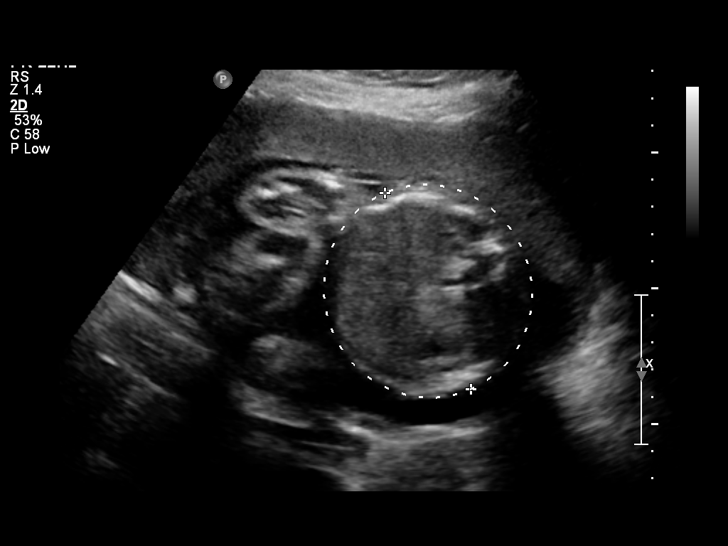
[im 24/28]
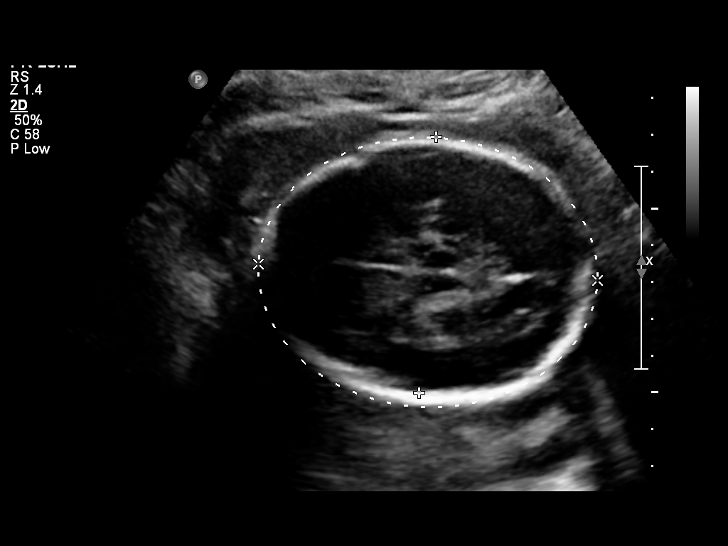
[im 26/28]
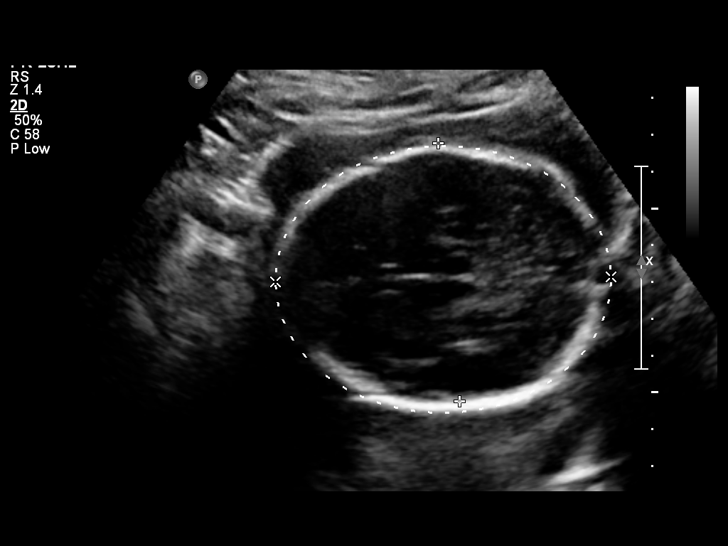

[12 of 28 positions shown; findings below may reference images not displayed]

OBSTETRICS REPORT
                      (Signed Final 02/27/2012 [DATE])

 Order#:         [id number]
Procedures

 US OB FOLLOW UP                                       76816.1
Indications

 Hypertension - Chronic/Pre-existing
 Assess Fetal Growth / Estimated Fetal Weight
Fetal Evaluation

 Fetal Heart Rate:  150                         bpm
 Cardiac Activity:  Observed
 Presentation:      Cephalic
 Placenta:          Anterior, above cervical os

 Amniotic Fluid
 AFI FV:      Subjectively within normal limits
 AFI Sum:     15.99   cm      58   %Tile     Larg Pckt:   5.58   cm
 RUQ:   4.83   cm    RLQ:    5.58   cm    LUQ:   3.45    cm   LLQ:    2.13   cm
Biometry

 BPD:     69.9  mm    G. Age:   28w 0d                CI:        73.23   70 - 86
                                                      FL/HC:      20.8   18.8 -

 HC:     259.6  mm    G. Age:   28w 2d       31  %    HC/AC:      1.07   1.05 -

 AC:     242.8  mm    G. Age:   28w 4d       63  %    FL/BPD:     77.1   71 - 87
 FL:      53.9  mm    G. Age:   28w 4d       55  %    FL/AC:      22.2   20 - 24

 Est. FW:    2088  gm    2 lb 12 oz      64  %
Gestational Age

 LMP:           27w 6d       Date:   08/16/11                 EDD:   05/22/12
 U/S Today:     28w 3d                                        EDD:   05/18/12
 Best:          27w 6d    Det. By:   LMP  (08/16/11)          EDD:   05/22/12
Anatomy

 Cranium:           Appears normal      Aortic Arch:       Previously seen
 Fetal Cavum:       Appears normal      Ductal Arch:       Not well
                                                           visualized
 Ventricles:        Appears normal      Diaphragm:         Previously seen
 Choroid Plexus:    Previously seen     Stomach:           Appears normal
 Cerebellum:        Previously seen     Abdomen:           Appears normal
 Posterior Fossa:   Previously seen     Abdominal Wall:    Previously seen
 Nuchal Fold:       Not applicable      Cord Vessels:      Previously seen
                    (>20 wks GA)
 Face:              Previously seen     Kidneys:           Appear normal
 Heart:             Previously seen     Bladder:           Appears normal
 RVOT:              Not well            Spine:             Previously seen
                    visualized
 LVOT:              Previously seen     Limbs:             Previously seen

 Other:     Heels, 5th digit, Nasal bone, and Male gender previously
            seen .
Cervix Uterus Adnexa

 Cervical Length:   3.8       cm

 Cervix:       Closed. Normal appearance by transabdominal scan.

 Adnexa:     No abnormality visualized.
Impression

   Single living intrauterine gestation in cephalic presentation
 with concordant gestational age and fetal indices.  The EFW
 today is at the 64th percentile. Normal amniotic fluid volume.

## 2014-04-27 ENCOUNTER — Ambulatory Visit (INDEPENDENT_AMBULATORY_CARE_PROVIDER_SITE_OTHER): Payer: BC Managed Care – PPO | Admitting: *Deleted

## 2014-04-27 DIAGNOSIS — Z3042 Encounter for surveillance of injectable contraceptive: Secondary | ICD-10-CM

## 2014-04-27 DIAGNOSIS — Z3049 Encounter for surveillance of other contraceptives: Secondary | ICD-10-CM

## 2014-04-27 MED ORDER — MEDROXYPROGESTERONE ACETATE 150 MG/ML IM SUSP
150.0000 mg | Freq: Once | INTRAMUSCULAR | Status: AC
  Administered 2014-04-27: 150 mg via INTRAMUSCULAR

## 2014-04-27 NOTE — Progress Notes (Signed)
   Pt in for Depo Provera injection.  Pt tolerated Depo injection. Depo given Right upper outer quadrant.  Next injection due Sept 23- Jul 27, 2014.  Reminder card given. Martin, Tamika L, RN   

## 2014-06-29 IMAGING — US US OB FOLLOW-UP
1 series · 12 of 28 positions shown · non-contrast
Comparison: none

[Series 1: us ob follow up · 12 of 37 slices shown]
[im 2/37]
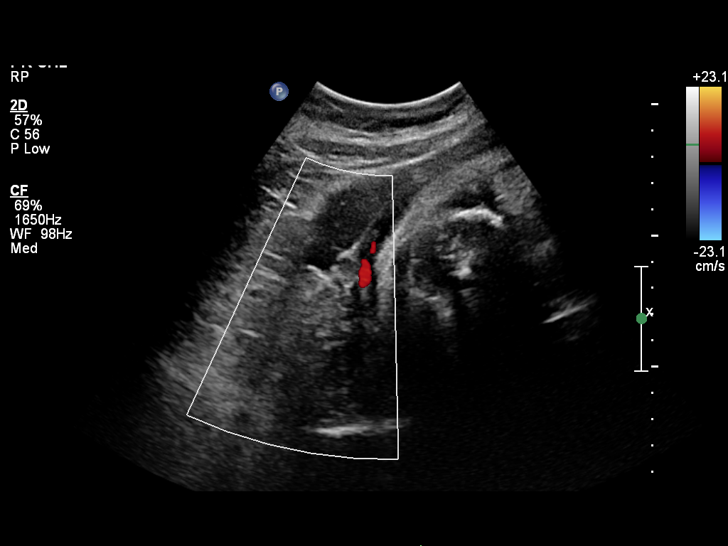
[im 5/37]
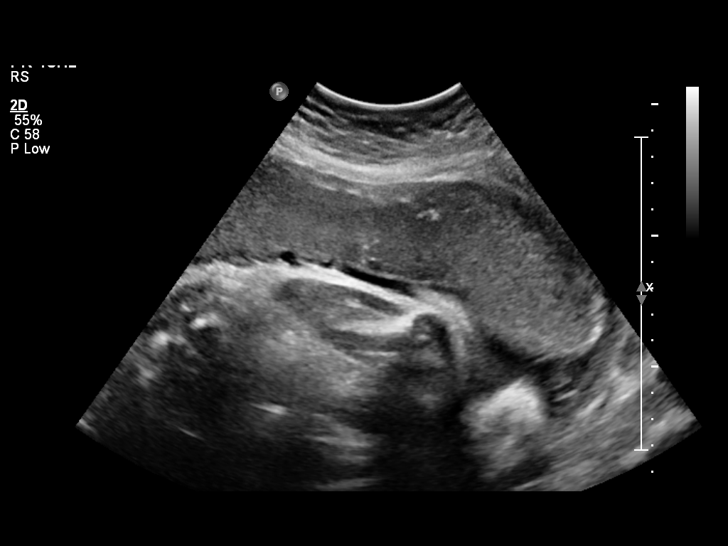
[im 7/37]
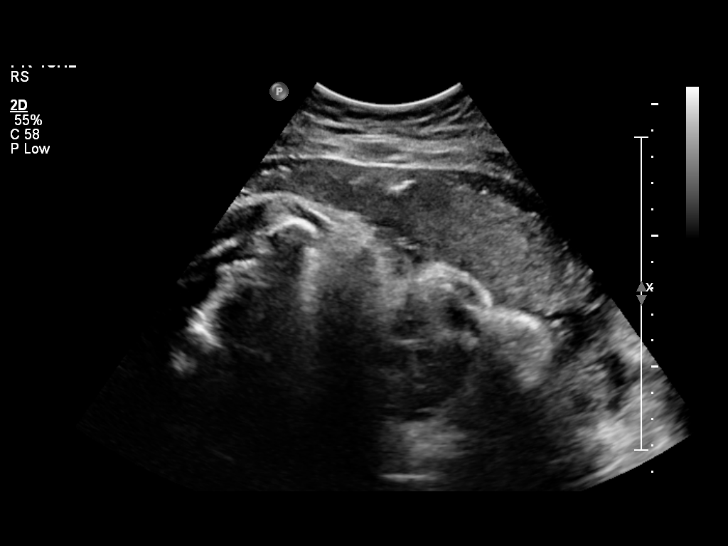
[im 11/37]
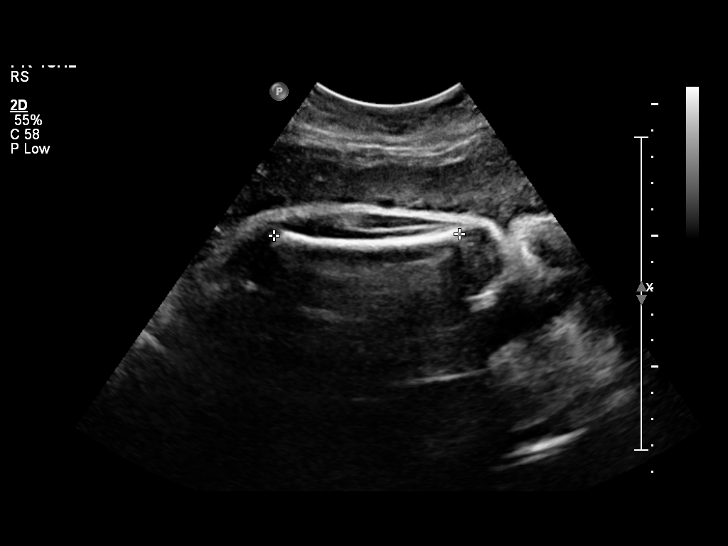
[im 14/37]
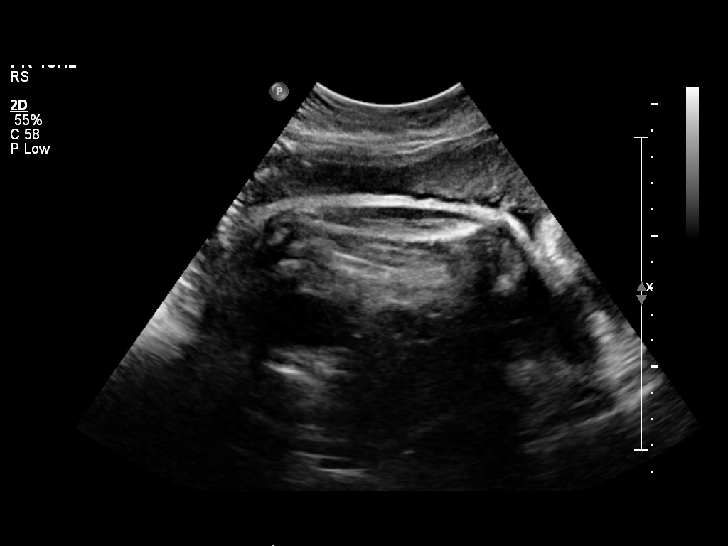
[im 17/37]
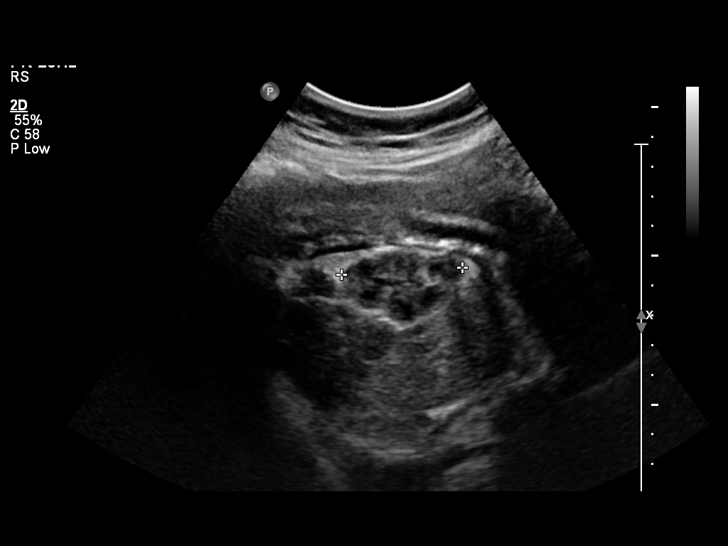
[im 21/37]
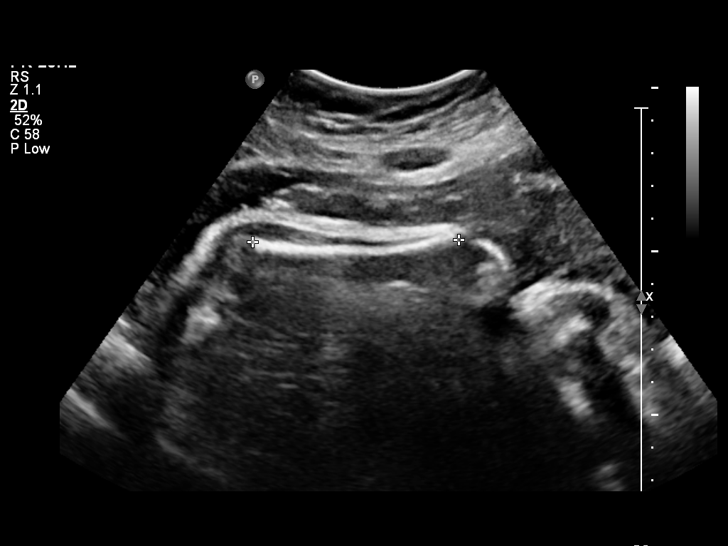
[im 23/37]
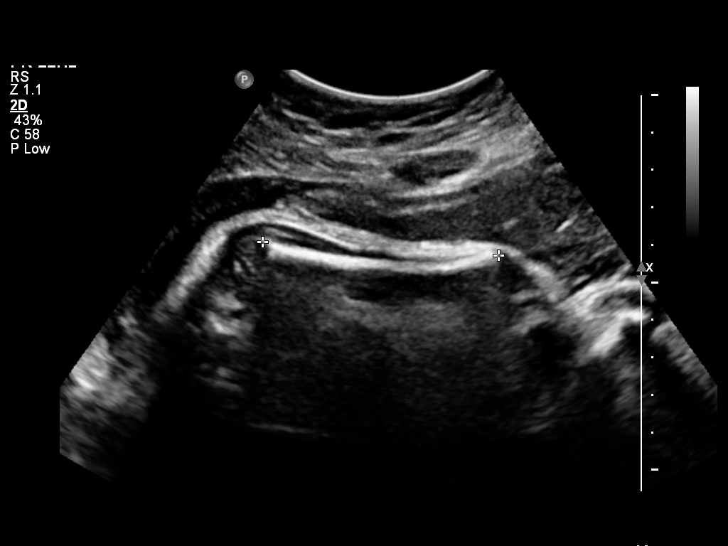
[im 26/37]
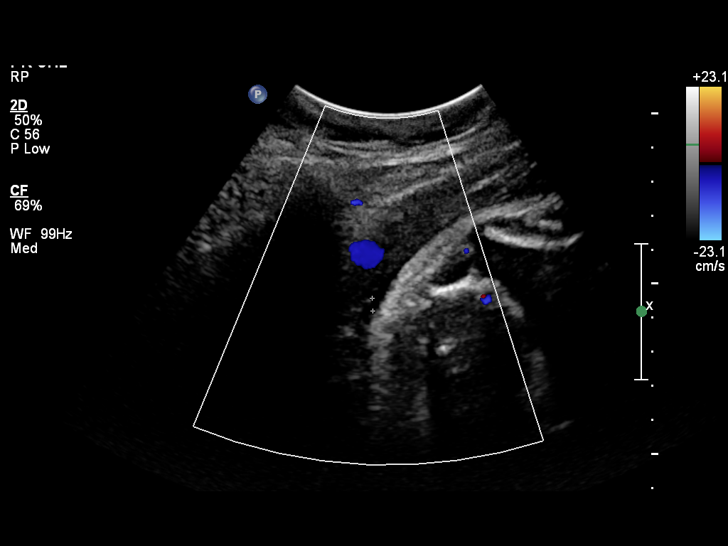
[im 30/37]
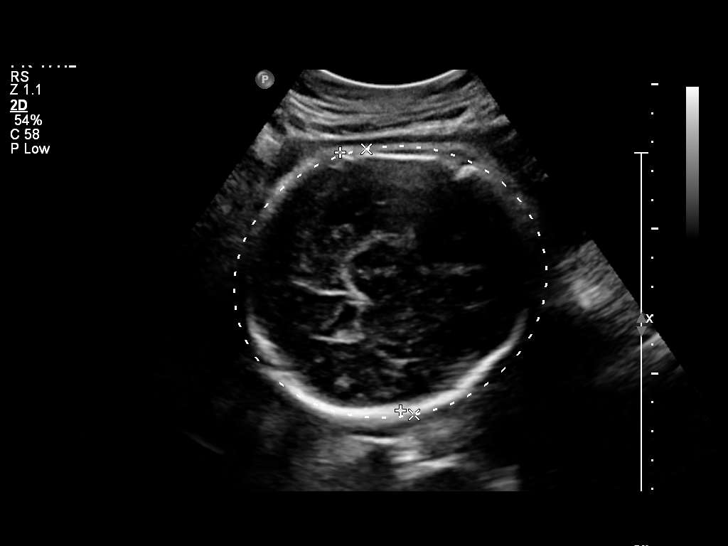
[im 33/37]
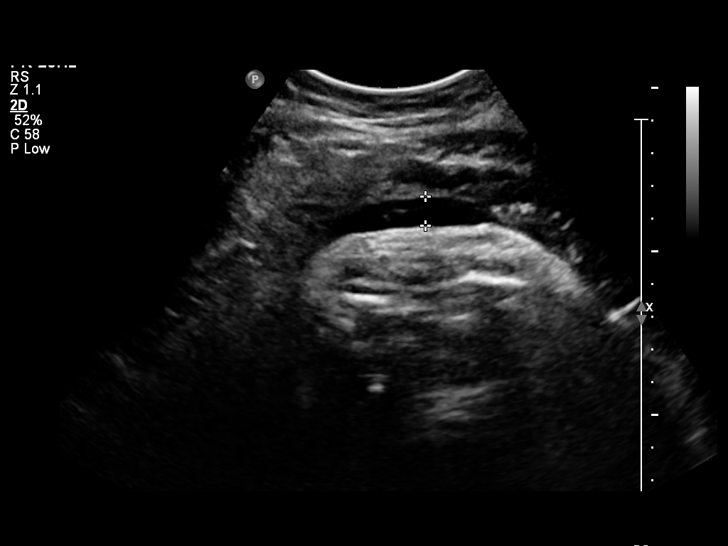
[im 35/37]
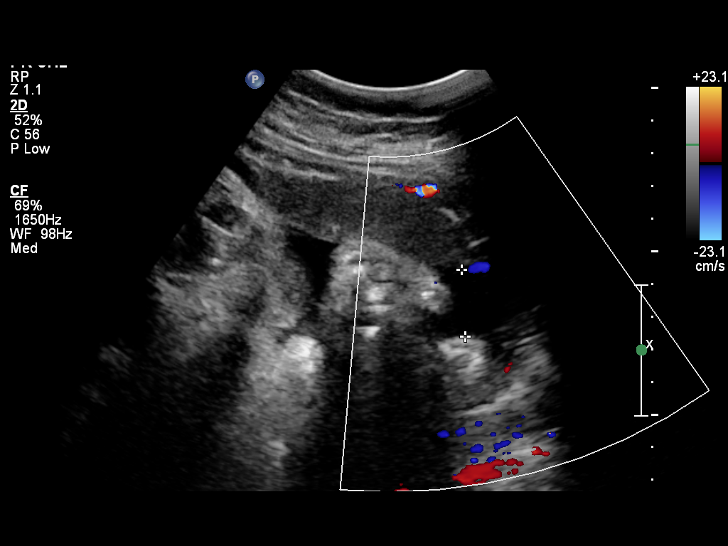

[12 of 28 positions shown; findings below may reference images not displayed]

OBSTETRICS REPORT
                      (Signed Final 05/04/2012 [DATE])

 Order#:         [id number]
Procedures

 US OB FOLLOW UP                                       76816.1
Indications

 Hypertension - Chronic/Pre-existing
 Assess Fetal Growth / Estimated Fetal Weight
Fetal Evaluation

 Preg. Location:    Intrauterine
 Fetal Heart Rate:  136                         bpm
 Cardiac Activity:  Observed
 Presentation:      Cephalic
 Placenta:          Anterior, above cervical os
 P. Cord            Previously Visualized
 Insertion:

 Amniotic Fluid
 AFI FV:      Subjectively decreased
 AFI Sum:     6.25    cm      < 3  %Tile     Larg Pckt:   2.99   cm
 RUQ:   0.33   cm    RLQ:    2.04   cm    LUQ:   0.89    cm   LLQ:    2.99   cm
Biometry

 BPD:       90  mm    G. Age:   36w 4d                CI:        78.67   70 - 86
                                                      FL/HC:      22.2   20.8 -

 HC:     320.9  mm    G. Age:   36w 1d        8  %    HC/AC:      0.99   0.92 -

 AC:     322.9  mm    G. Age:   36w 1d       31  %    FL/BPD:     79.2   71 - 87
 FL:      71.3  mm    G. Age:   36w 4d       28  %    FL/AC:      22.1   20 - 24
 HUM:     62.7  mm    G. Age:   36w 3d       53  %

 Est. FW:    7367  gm      6 lb 7 oz     47  %
Gestational Age

 LMP:           37w 3d       Date:   08/16/11                 EDD:   05/22/12
 U/S Today:     36w 2d                                        EDD:   05/30/12
 Best:          37w 3d    Det. By:   LMP  (08/16/11)          EDD:   05/22/12
Anatomy
 Cranium:           Previously seen     Aortic Arch:       Previously seen
 Fetal Cavum:       Previously seen     Ductal Arch:       Not well
                                                           visualized
 Ventricles:        Previously seen     Diaphragm:         Previously seen
 Choroid Plexus:    Previously seen     Stomach:           Appears
                                                           normal, left
                                                           sided
 Cerebellum:        Previously seen     Abdomen:           Appears normal
 Posterior Fossa:   Previously seen     Abdominal Wall:    Previously seen
 Nuchal Fold:       Not applicable      Cord Vessels:      Previously seen
                    (>20 wks GA)
 Face:              Previously seen     Kidneys:           Appear normal
 Heart:             Appears normal      Bladder:           Appears normal
                    (4 chamber &
                    axis)
 RVOT:              Not well            Spine:             Previously seen
                    visualized
 LVOT:              Previously seen     Limbs:             Previously seen
Cervix Uterus Adnexa

 Cervix:       Not visualized (advanced GA >34 wks)
 Uterus:       No abnormality visualized.
 Left Ovary:   No adnexal mass visualized.
 Right Ovary:  No adnexal mass visualized.
Impression

 The EFW today is normal, at the 47th percentile (previously
 68th percentile on 03/30/12.) The fetal indices are concordant.

 Oligohydramnios with AFI = 6.3 cm (previously 9 cm on
 04/13/12.)

## 2014-08-04 ENCOUNTER — Emergency Department (INDEPENDENT_AMBULATORY_CARE_PROVIDER_SITE_OTHER)
Admission: EM | Admit: 2014-08-04 | Discharge: 2014-08-04 | Disposition: A | Discharge status: Home / Self Care | Payer: BC Managed Care – PPO | Source: Home / Self Care | Attending: Family Medicine | Admitting: Family Medicine

## 2014-08-04 ENCOUNTER — Encounter (HOSPITAL_COMMUNITY): Payer: Self-pay | Admitting: Emergency Medicine

## 2014-08-04 DIAGNOSIS — R319 Hematuria, unspecified: Secondary | ICD-10-CM

## 2014-08-04 LAB — POCT URINALYSIS DIP (DEVICE)
BILIRUBIN URINE: NEGATIVE
Glucose, UA: NEGATIVE mg/dL
KETONES UR: NEGATIVE mg/dL
Nitrite: NEGATIVE
Protein, ur: 30 mg/dL — AB
Specific Gravity, Urine: 1.015 (ref 1.005–1.030)
Urobilinogen, UA: 0.2 mg/dL (ref 0.0–1.0)
pH: 7.5 (ref 5.0–8.0)

## 2014-08-04 LAB — POCT PREGNANCY, URINE: Preg Test, Ur: NEGATIVE

## 2014-08-04 NOTE — ED Notes (Signed)
Patient reports blood in urine, noted yesterday.  Patient also c/o abdominal pain and low back pain.  Patient has had kidney infection in the past.

## 2014-08-04 NOTE — Discharge Instructions (Signed)
The blood in your urine may be from infection, break through period bleeding, kidney stones, or a growth in your bladder.  Please follow up with you regular doctor if this happens again in the future.  We will cal lyou if any of your other lab work comes back significant   Hematuria Hematuria is blood in your urine. It can be caused by a bladder infection, kidney infection, prostate infection, kidney stone, or cancer of your urinary tract. Infections can usually be treated with medicine, and a kidney stone usually will pass through your urine. If neither of these is the cause of your hematuria, further workup to find out the reason may be needed. It is very important that you tell your health care provider about any blood you see in your urine, even if the blood stops without treatment or happens without causing pain. Blood in your urine that happens and then stops and then happens again can be a symptom of a very serious condition. Also, pain is not a symptom in the initial stages of many urinary cancers. HOME CARE INSTRUCTIONS   Drink lots of fluid, 3-4 quarts a day. If you have been diagnosed with an infection, cranberry juice is especially recommended, in addition to large amounts of water.  Avoid caffeine, tea, and carbonated beverages because they tend to irritate the bladder.  Avoid alcohol because it may irritate the prostate.  Take all medicines as directed by your health care provider.  If you were prescribed an antibiotic medicine, finish it all even if you start to feel better.  If you have been diagnosed with a kidney stone, follow your health care provider's instructions regarding straining your urine to catch the stone.  Empty your bladder often. Avoid holding urine for long periods of time.  After a bowel movement, women should cleanse front to back. Use each tissue only once.  Empty your bladder before and after sexual intercourse if you are a female. SEEK MEDICAL CARE  IF:  You develop back pain.  You have a fever.  You have a feeling of sickness in your stomach (nausea) or vomiting.  Your symptoms are not better in 3 days. Return sooner if you are getting worse. SEEK IMMEDIATE MEDICAL CARE IF:   You develop severe vomiting and are unable to keep the medicine down.  You develop severe back or abdominal pain despite taking your medicines.  You begin passing a large amount of blood or clots in your urine.  You feel extremely weak or faint, or you pass out. MAKE SURE YOU:   Understand these instructions.  Will watch your condition.  Will get help right away if you are not doing well or get worse. Document Released: 10/07/2005 Document Revised: 02/21/2014 Document Reviewed: 06/07/2013 [facility] Patient Information 2015 [place], [place]. This information is not intended to replace advice given to you by your health care provider. Make sure you discuss any questions you have with your health care provider.

## 2014-08-04 NOTE — ED Provider Notes (Signed)
CSN: [id number]     Arrival date & time 08/04/14  1132 History   First MD Initiated Contact with Patient 08/04/14 1149     Chief Complaint  Patient presents with  . Hematuria   (Consider location/radiation/quality/duration/timing/severity/associated sxs/prior Treatment) HPI  Hematuria: started yesteray. Getting worse. On Depo so no menstrual regularity. Currently w/ mild abd cramping. Denies fevers, dysuria, frequency, unintentional wt loss, flank pain. Last BM 3 days ago and pt typically daily BM.     Past Medical History  Diagnosis Date  . Hypertension   . Ovarian cyst    Past Surgical History  Procedure Laterality Date  . Cesarean section  2000   Family History  Problem Relation Age of Onset  . Kidney disease Father   . Diabetes Father   . Hypertension Maternal Aunt   . Diabetes Maternal Aunt   . Hypertension Maternal Grandmother   . Anesthesia problems Neg Hx   . Hypotension Neg Hx   . Malignant hyperthermia Neg Hx   . Pseudochol deficiency Neg Hx    History  Substance Use Topics  . Smoking status: Current Every Day Smoker -- 0.10 packs/day for 12 years    Types: Cigarettes  . Smokeless tobacco: Never Used  . Alcohol Use: No   OB History   Grav Para Term Preterm Abortions TAB SAB Ect Mult Living   2 2 1 1  0 0 0 0 0 2     Review of Systems Per HPI with all other pertinent systems negative.   Allergies  Penicillins  Home Medications   Prior to Admission medications   Medication Sig Start Date End Date Taking? Authorizing Provider  amLODipine (NORVASC) 5 MG tablet Take 1 tablet (5 mg total) by mouth daily. 09/21/12   Lora PaulaJosalyn C Funches, MD  butalbital-acetaminophen-caffeine (FIORICET) 50-325-40 MG per tablet Take 1 tablet by mouth 2 (two) times daily as needed.    Historical Provider, MD  enalapril (VASOTEC) 10 MG tablet Take 1 tablet (10 mg total) by mouth daily. 05/19/12 05/19/13  Glori LuisEric G Sonnenberg, MD  hydrochlorothiazide (HYDRODIURIL) 25 MG tablet TAKE 2  TABLETS BY MOUTH DAILY 11/27/12   Lora PaulaJosalyn C Funches, MD  ibuprofen (ADVIL,MOTRIN) 800 MG tablet Take 800 mg by mouth every 8 (eight) hours as needed.    Historical Provider, MD  medroxyPROGESTERone (DEPO-PROVERA) 150 MG/ML injection Inject 1 mL (150 mg total) into the muscle every 3 (three) months. 09/02/12   Josalyn C Funches, MD  Prenatal Vit-Fe Fumarate-FA (PRENAPLUS) 27-1 MG TABS TAKE 1 TABLET BY MOUTH EVERY DAY 09/22/12   Josalyn C Funches, MD   BP 160/98  Pulse 79  Temp(Src) 98.6 F (37 C) (Oral)  Resp 16  SpO2 100% Physical Exam  Constitutional: She is oriented to person, place, and time. She appears well-developed and well-nourished. No distress.  HENT:  Head: Normocephalic and atraumatic.  Eyes: EOM are normal. Pupils are equal, round, and reactive to light.  Neck: Normal range of motion.  Cardiovascular: Normal rate and normal heart sounds.   No murmur heard. Pulmonary/Chest: Effort normal and breath sounds normal.  Abdominal: Soft.  Mild crampy diffuse discomfort along the LLQ.  Musculoskeletal: Normal range of motion.  Neurological: She is alert and oriented to person, place, and time.  Skin: Skin is warm. She is not diaphoretic.  Psychiatric: She has a normal mood and affect. Her behavior is normal. Judgment normal.    ED Course  Procedures (including critical care time) Labs Review Labs Reviewed  POCT URINALYSIS  DIP (DEVICE) - Abnormal; Notable for the following:    Hgb urine dipstick LARGE (*)    Protein, ur 30 (*)    Leukocytes, UA TRACE (*)    All other components within normal limits  URINE CULTURE  URINALYSIS, ROUTINE W REFLEX MICROSCOPIC  POCT PREGNANCY, URINE    Imaging Review No results found.   MDM   1. Hematuria    DIfferential include benign hematuria, bladder growth (smoker), renal stones (less likely as w/o flank pain), or infection (UA w/o significant signs of infection), or breakthrough menstrual bleeding. UA microanalysis Urine CX F/u  urology if continues  Precautions given and all questions answered  Shelly Flattenavid Shawnee Gambone, MD Family Medicine 08/04/2014, 12:48 PM      Ozella Rocksavid J Kaely Hollan, MD 08/04/14 1248

## 2014-08-04 NOTE — ED Notes (Signed)
Patient being treated with daughter as a patient , too in the same treatment room, same provider

## 2014-08-05 LAB — URINE CULTURE

## 2014-08-21 DIAGNOSIS — Z87442 Personal history of urinary calculi: Secondary | ICD-10-CM

## 2014-08-21 HISTORY — DX: Personal history of urinary calculi: Z87.442

## 2014-08-22 ENCOUNTER — Encounter (HOSPITAL_COMMUNITY): Payer: Self-pay | Admitting: Emergency Medicine

## 2014-09-03 ENCOUNTER — Encounter (HOSPITAL_COMMUNITY): Payer: Self-pay

## 2014-09-03 DIAGNOSIS — Z720 Tobacco use: Secondary | ICD-10-CM | POA: Diagnosis not present

## 2014-09-03 DIAGNOSIS — Z880 Allergy status to penicillin: Secondary | ICD-10-CM | POA: Insufficient documentation

## 2014-09-03 DIAGNOSIS — R109 Unspecified abdominal pain: Secondary | ICD-10-CM | POA: Diagnosis present

## 2014-09-03 DIAGNOSIS — N201 Calculus of ureter: Secondary | ICD-10-CM | POA: Diagnosis not present

## 2014-09-03 DIAGNOSIS — I10 Essential (primary) hypertension: Secondary | ICD-10-CM | POA: Diagnosis not present

## 2014-09-03 DIAGNOSIS — Z8742 Personal history of other diseases of the female genital tract: Secondary | ICD-10-CM | POA: Insufficient documentation

## 2014-09-03 DIAGNOSIS — Z9889 Other specified postprocedural states: Secondary | ICD-10-CM | POA: Diagnosis not present

## 2014-09-03 DIAGNOSIS — N23 Unspecified renal colic: Secondary | ICD-10-CM | POA: Insufficient documentation

## 2014-09-03 DIAGNOSIS — Z79899 Other long term (current) drug therapy: Secondary | ICD-10-CM | POA: Diagnosis not present

## 2014-09-03 LAB — COMPREHENSIVE METABOLIC PANEL
ALT: 15 U/L (ref 0–35)
ANION GAP: 14 (ref 5–15)
AST: 16 U/L (ref 0–37)
Albumin: 4.3 g/dL (ref 3.5–5.2)
Alkaline Phosphatase: 109 U/L (ref 39–117)
BUN: 10 mg/dL (ref 6–23)
CALCIUM: 9.7 mg/dL (ref 8.4–10.5)
CO2: 24 mEq/L (ref 19–32)
Chloride: 101 mEq/L (ref 96–112)
Creatinine, Ser: 0.73 mg/dL (ref 0.50–1.10)
GFR calc Af Amer: >90 mL/min (ref >90)
Glucose, Bld: 90 mg/dL (ref 70–99)
Potassium: 3.3 mEq/L — ABNORMAL LOW (ref 3.7–5.3)
Sodium: 139 mEq/L (ref 137–147)
Total Bilirubin: 0.3 mg/dL (ref 0.3–1.2)
Total Protein: 8.7 g/dL — ABNORMAL HIGH (ref 6.0–8.3)

## 2014-09-03 LAB — LIPASE, BLOOD: LIPASE: 27 U/L (ref 11–59)

## 2014-09-03 MED ORDER — OXYCODONE-ACETAMINOPHEN 5-325 MG PO TABS
1.0000 | ORAL_TABLET | Freq: Once | ORAL | Status: AC
  Administered 2014-09-03: 1 via ORAL
  Filled 2014-09-03: qty 1

## 2014-09-03 MED ORDER — ONDANSETRON 4 MG PO TBDP
8.0000 mg | ORAL_TABLET | Freq: Once | ORAL | Status: AC
  Administered 2014-09-03: 8 mg via ORAL
  Filled 2014-09-03: qty 2

## 2014-09-03 NOTE — ED Notes (Addendum)
Pt. Reports lower left abdominal pain radiating to left side/flank. Denies vaginal bleeding/discharge. Reports intermittent blood in urine. N/V denies diarrhea.

## 2014-09-04 ENCOUNTER — Emergency Department (HOSPITAL_COMMUNITY)
Admission: EM | Admit: 2014-09-04 | Discharge: 2014-09-04 | Disposition: A | Discharge status: Home / Self Care | Payer: BC Managed Care – PPO | Attending: Emergency Medicine | Admitting: Emergency Medicine

## 2014-09-04 ENCOUNTER — Emergency Department (HOSPITAL_COMMUNITY): Payer: BC Managed Care – PPO

## 2014-09-04 DIAGNOSIS — N23 Unspecified renal colic: Secondary | ICD-10-CM

## 2014-09-04 DIAGNOSIS — R109 Unspecified abdominal pain: Secondary | ICD-10-CM

## 2014-09-04 DIAGNOSIS — N201 Calculus of ureter: Secondary | ICD-10-CM

## 2014-09-04 LAB — URINALYSIS, ROUTINE W REFLEX MICROSCOPIC
Bilirubin Urine: NEGATIVE
Glucose, UA: NEGATIVE mg/dL
KETONES UR: NEGATIVE mg/dL
NITRITE: NEGATIVE
PH: 7.5 (ref 5.0–8.0)
Protein, ur: 30 mg/dL — AB
Specific Gravity, Urine: 1.02 (ref 1.005–1.030)
UROBILINOGEN UA: 1 mg/dL (ref 0.0–1.0)

## 2014-09-04 LAB — URINE MICROSCOPIC-ADD ON

## 2014-09-04 LAB — CBC WITH DIFFERENTIAL/PLATELET
BASOS ABS: 0 10*3/uL (ref 0.0–0.1)
Basophils Relative: 0 % (ref 0–1)
EOS ABS: 0.1 10*3/uL (ref 0.0–0.7)
Eosinophils Relative: 1 % (ref 0–5)
HEMATOCRIT: 41.9 % (ref 36.0–46.0)
Hemoglobin: 14 g/dL (ref 12.0–15.0)
Lymphocytes Relative: 30 % (ref 12–46)
Lymphs Abs: 3.8 10*3/uL (ref 0.7–4.0)
MCH: 28.6 pg (ref 26.0–34.0)
MCHC: 33.4 g/dL (ref 30.0–36.0)
MCV: 85.7 fL (ref 78.0–100.0)
Monocytes Absolute: 0.9 10*3/uL (ref 0.1–1.0)
Monocytes Relative: 7 % (ref 3–12)
Neutro Abs: 7.8 10*3/uL — ABNORMAL HIGH (ref 1.7–7.7)
Neutrophils Relative %: 62 % (ref 43–77)
Platelets: ADEQUATE 10*3/uL (ref 150–400)
RBC: 4.89 MIL/uL (ref 3.87–5.11)
RDW: 13.4 % (ref 11.5–15.5)
WBC: 12.6 10*3/uL — ABNORMAL HIGH (ref 4.0–10.5)

## 2014-09-04 LAB — POC URINE PREG, ED: Preg Test, Ur: NEGATIVE

## 2014-09-04 MED ORDER — NAPROXEN 250 MG PO TABS
250.0000 mg | ORAL_TABLET | Freq: Two times a day (BID) | ORAL | Status: DC | PRN
Start: 2014-09-04 — End: 2015-06-20

## 2014-09-04 MED ORDER — TAMSULOSIN HCL 0.4 MG PO CAPS: 0.4000 mg | ORAL_CAPSULE | Freq: Every day | ORAL | Status: DC

## 2014-09-04 MED ORDER — ONDANSETRON HCL 4 MG PO TABS: 4.0000 mg | ORAL_TABLET | Freq: Three times a day (TID) | ORAL | Status: DC | PRN

## 2014-09-04 MED ORDER — OXYCODONE-ACETAMINOPHEN 5-325 MG PO TABS: ORAL_TABLET | ORAL | Status: DC

## 2014-09-04 NOTE — ED Provider Notes (Signed)
CSN: [id number]     Arrival date & time 09/03/14  2224 History   First MD Initiated Contact with Patient 09/04/14 0355     Chief Complaint  Patient presents with  . Abdominal Pain     HPI  Pt was seen at 0415. Per pt, c/o sudden onset and resolution of one episode of left sided flank "pain" that began this evening PTA.  Pt describes the pain as "dull," with radiation into the left side of her abd.  Has been associated with multiple intermittent episodes of N/V.  Denies vaginal bleeding/discharge, no dysuria/hematuria, no abd pain, no diarrhea, no black or blood in emesis, no CP/SOB.    Past Medical History  Diagnosis Date  . Hypertension   . Ovarian cyst    Past Surgical History  Procedure Laterality Date  . Cesarean section  2000   Family History  Problem Relation Age of Onset  . Kidney disease Father   . Diabetes Father   . Hypertension Maternal Aunt   . Diabetes Maternal Aunt   . Hypertension Maternal Grandmother   . Anesthesia problems Neg Hx   . Hypotension Neg Hx   . Malignant hyperthermia Neg Hx   . Pseudochol deficiency Neg Hx    History  Substance Use Topics  . Smoking status: Current Every Day Smoker -- 0.10 packs/day for 12 years    Types: Cigarettes  . Smokeless tobacco: Never Used  . Alcohol Use: No   OB History    Gravida Para Term Preterm AB TAB SAB Ectopic Multiple Living   2 2 1 1  0 0 0 0 0 2     Review of Systems ROS: Statement: All systems negative except as marked or noted in the HPI; Constitutional: Negative for fever and chills. ; ; Eyes: Negative for eye pain, redness and discharge. ; ; ENMT: Negative for ear pain, hoarseness, nasal congestion, sinus pressure and sore throat. ; ; Cardiovascular: Negative for chest pain, palpitations, diaphoresis, dyspnea and peripheral edema. ; ; Respiratory: Negative for cough, wheezing and stridor. ; ; Gastrointestinal: +N/V. Negative for diarrhea, abdominal pain, blood in stool, hematemesis, jaundice and rectal  bleeding. . ; ; Genitourinary: +flank pain. Negative for dysuria and hematuria. ; GYN:  No vaginal bleeding, no vaginal discharge, no vulvar pain. ;; ; Musculoskeletal: Negative for back pain and neck pain. Negative for swelling and trauma.; ; Skin: Negative for pruritus, rash, abrasions, blisters, bruising and skin lesion.; ; Neuro: Negative for headache, lightheadedness and neck stiffness. Negative for weakness, altered level of consciousness , altered mental status, extremity weakness, paresthesias, involuntary movement, seizure and syncope.      Allergies  Penicillins  Home Medications   Prior to Admission medications   Medication Sig Start Date End Date Taking? Authorizing Provider  amLODipine (NORVASC) 5 MG tablet Take 1 tablet (5 mg total) by mouth daily. 09/21/12  Yes Josalyn C Funches, MD  butalbital-acetaminophen-caffeine (FIORICET) 50-325-40 MG per tablet Take 1 tablet by mouth 2 (two) times daily as needed for headache.    Yes Historical Provider, MD  enalapril (VASOTEC) 10 MG tablet Take 1 tablet (10 mg total) by mouth daily. 05/19/12 09/03/14 Yes Glori LuisEric G Sonnenberg, MD  hydrochlorothiazide (HYDRODIURIL) 25 MG tablet TAKE 2 TABLETS BY MOUTH DAILY 11/27/12  Yes Josalyn C Funches, MD  ibuprofen (ADVIL,MOTRIN) 800 MG tablet Take 800 mg by mouth every 8 (eight) hours as needed for moderate pain.    Yes Historical Provider, MD  medroxyPROGESTERone (DEPO-PROVERA) 150 MG/ML injection Inject  1 mL (150 mg total) into the muscle every 3 (three) months. 09/02/12  Yes Josalyn C Funches, MD  Prenatal Vit-Fe Fumarate-FA (PRENAPLUS) 27-1 MG TABS TAKE 1 TABLET BY MOUTH EVERY DAY Patient not taking: Reported on 09/03/2014 09/22/12   Josalyn C Funches, MD   BP 164/92 mmHg  Pulse 60  Temp(Src) 98 F (36.7 C) (Oral)  Resp 24  SpO2 100% Physical Exam  0420: Physical examination:  Nursing notes reviewed; Vital signs and O2 SAT reviewed;  Constitutional: Well developed, Well nourished, Well hydrated, In  no acute distress; Head:  Normocephalic, atraumatic; Eyes: EOMI, PERRL, No scleral icterus; ENMT: Mouth and pharynx normal, Mucous membranes moist; Neck: Supple, Full range of motion, No lymphadenopathy; Cardiovascular: Regular rate and rhythm, No murmur, rub, or gallop; Respiratory: Breath sounds clear & equal bilaterally, No rales, rhonchi, wheezes.  Speaking full sentences with ease, Normal respiratory effort/excursion; Chest: Nontender, Movement normal; Abdomen: Soft, Nontender, Nondistended, Normal bowel sounds; Genitourinary: No CVA tenderness; Spine:  No midline CS, TS, LS tenderness. +mild TTP left lumbar paraspinal muscles. No rash.;; Extremities: Pulses normal, No tenderness, No edema, No calf edema or asymmetry.; Neuro: AA&Ox3, Major CN grossly intact.  Speech clear. No gross focal motor or sensory deficits in extremities.; Skin: Color normal, Warm, Dry.   ED Course  Procedures     MDM  MDM Reviewed: nursing note and vitals Interpretation: labs and CT scan     Results for orders placed or performed during the hospital encounter of 09/04/14  CBC with Differential  Result Value Ref Range   WBC 12.6 (H) 4.0 - 10.5 K/uL   RBC 4.89 3.87 - 5.11 MIL/uL   Hemoglobin 14.0 12.0 - 15.0 g/dL   HCT 09.841.9 11.936.0 - 14.746.0 %   MCV 85.7 78.0 - 100.0 fL   MCH 28.6 26.0 - 34.0 pg   MCHC 33.4 30.0 - 36.0 g/dL   RDW 82.913.4 56.211.5 - 13.015.5 %   Platelets  150 - 400 K/uL    PLATELET CLUMPS NOTED ON SMEAR, COUNT APPEARS ADEQUATE   Neutrophils Relative % 62 43 - 77 %   Lymphocytes Relative 30 12 - 46 %   Monocytes Relative 7 3 - 12 %   Eosinophils Relative 1 0 - 5 %   Basophils Relative 0 0 - 1 %   Neutro Abs 7.8 (H) 1.7 - 7.7 K/uL   Lymphs Abs 3.8 0.7 - 4.0 K/uL   Monocytes Absolute 0.9 0.1 - 1.0 K/uL   Eosinophils Absolute 0.1 0.0 - 0.7 K/uL   Basophils Absolute 0.0 0.0 - 0.1 K/uL  Comprehensive metabolic panel  Result Value Ref Range   Sodium 139 137 - 147 mEq/L   Potassium 3.3 (L) 3.7 - 5.3 mEq/L    Chloride 101 96 - 112 mEq/L   CO2 24 19 - 32 mEq/L   Glucose, Bld 90 70 - 99 mg/dL   BUN 10 6 - 23 mg/dL   Creatinine, Ser 8.650.73 0.50 - 1.10 mg/dL   Calcium 9.7 8.4 - 78.410.5 mg/dL   Total Protein 8.7 (H) 6.0 - 8.3 g/dL   Albumin 4.3 3.5 - 5.2 g/dL   AST 16 0 - 37 U/L   ALT 15 0 - 35 U/L   Alkaline Phosphatase 109 39 - 117 U/L   Total Bilirubin 0.3 0.3 - 1.2 mg/dL   GFR calc non Af Amer >90 >90 mL/min   GFR calc Af Amer >90 >90 mL/min   Anion gap 14 5 - 15  Lipase, blood  Result Value Ref Range   Lipase 27 11 - 59 U/L  Urinalysis, Routine w reflex microscopic  Result Value Ref Range   Color, Urine YELLOW YELLOW   APPearance CLOUDY (A) CLEAR   Specific Gravity, Urine 1.020 1.005 - 1.030   pH 7.5 5.0 - 8.0   Glucose, UA NEGATIVE NEGATIVE mg/dL   Hgb urine dipstick LARGE (A) NEGATIVE   Bilirubin Urine NEGATIVE NEGATIVE   Ketones, ur NEGATIVE NEGATIVE mg/dL   Protein, ur 30 (A) NEGATIVE mg/dL   Urobilinogen, UA 1.0 0.0 - 1.0 mg/dL   Nitrite NEGATIVE NEGATIVE   Leukocytes, UA SMALL (A) NEGATIVE  Urine microscopic-add on  Result Value Ref Range   Squamous Epithelial / LPF FEW (A) RARE   WBC, UA 0-2 <3 WBC/hpf   RBC / HPF TOO NUMEROUS TO COUNT <3 RBC/hpf   Bacteria, UA FEW (A) RARE   Urine-Other MUCOUS PRESENT   POC Urine Pregnancy, ED  (If Pre-menopausal female) - do not order at [facility]  Result Value Ref Range   Preg Test, Ur NEGATIVE NEGATIVE   Ct Abdomen Pelvis Wo Contrast 09/04/2014   CLINICAL DATA:  Sudden onset left flank pain  EXAM: CT ABDOMEN AND PELVIS WITHOUT CONTRAST  TECHNIQUE: Multidetector CT imaging of the abdomen and pelvis was performed following the standard protocol without IV contrast.  COMPARISON:  None.  FINDINGS: There is a 9 mm proximal left ureteral calculus resulting in mild left hydronephrosis. There are bilateral nonobstructing renal calculi. There is no other ureteral or bladder calculus. No obstructive uropathy. No perinephric stranding is seen. The  kidneys are symmetric in size without evidence for exophytic mass. The bladder is unremarkable.  The visualized portions of the liver demonstrates no focal abnormality. The gallbladder is unremarkable. The visualized spleen demonstrates no focal abnormality. The visualized adrenal glands and visualized pancreas are normal.  The unopacified stomach, duodenum, small intestine and large intestine are unremarkable, but evaluation is limited by lack of oral contrast. There is a normal caliber appendix in the right lower quadrant without periappendiceal inflammatory changes.There is a fat containing umbilical hernia. There is no pneumoperitoneum, pneumatosis, or portal venous gas. There is no abdominal or pelvic free fluid. There is no lymphadenopathy.  The abdominal aorta is normal in caliber.  The osseous structures are unremarkable.  IMPRESSION: 1. There is a 9 mm proximal left ureteral calculus resulting in mild left hydronephrosis. 2. Bilateral nonobstructing renal calculi.   Electronically Signed   By: Elige Ko   On: 09/04/2014 05:09    0610:  Pt continues to feel better after pain meds and wants to go home now. Pt has slept for most of her ED visit. Has tol PO well without N/V. Dx and testing d/w pt.  Questions answered.  Verb understanding, agreeable to d/c home with outpt f/u.   Samuel Jester, DO 09/07/14 [phone]

## 2014-09-04 NOTE — Discharge Instructions (Signed)
°Emergency Department Resource Guide °1) Find a Doctor and Pay Out of Pocket °Although you won't have to find out who is covered by your insurance plan, it is a good idea to ask around and get recommendations. You will then need to call the office and see if the doctor you have chosen will accept you as a new patient and what types of options they offer for patients who are self-pay. Some doctors offer discounts or will set up payment plans for their patients who do not have insurance, but you will need to ask so you aren't surprised when you get to your appointment. ° °2) Contact Your Local Health Department °Not all health departments have doctors that can see patients for sick visits, but many do, so it is worth a call to see if yours does. If you don't know where your local health department is, you can check in your phone book. The CDC also has a tool to help you locate your state's health department, and many state websites also have listings of all of their local health departments. ° °3) Find a Walk-in Clinic °If your illness is not likely to be very severe or complicated, you may want to try a walk in clinic. These are popping up all over the country in pharmacies, drugstores, and shopping centers. They're usually staffed by nurse practitioners or physician assistants that have been trained to treat common illnesses and complaints. They're usually fairly quick and inexpensive. However, if you have serious medical issues or chronic medical problems, these are probably not your best option. ° °No Primary Care Doctor: °- Call [facility] at  832-8000 - they can help you locate a primary care doctor that  accepts your insurance, provides certain services, etc. °- Physician Referral Service- 1-800-533-3463 ° °Chronic Pain Problems: °Organization         Address  Phone   Notes  °[facility]  (336) 297-2271 Patients need to be referred by their primary care doctor.  ° °Medication  Assistance: °Organization         Address  Phone   Notes  °[place] [facility] [address] °[place], [place] [address] (336) 641-8030 --Must be a resident of [place] °-- Must have NO insurance coverage whatsoever (no Medicaid/ Medicare, etc.) °-- The pt. MUST have a primary care doctor that directs their care regularly and follows them in the community °  °[facility]  (866) 331-1348   °[employer]  (888) 892-1162   ° °Agencies that provide inexpensive medical care: °Organization         Address  Phone   Notes  °[facility]  (336) 832-8035   °[facility]    (336) 832-7272   °[facility] [address] °[place], [place] [address] (336) 832-4777   °[facility] [address], °[place] (336) 271-4999   °[facility]    (336) 373-0678   °[facility]    (336) 272-1050   °[facility] ° [address], [place] Phone:  (336) 832-4444, Fax:  (336) 832-4440 Hours of Operation:  9 am - 6 pm, M-F.  Also accepts Medicaid/Medicare and self-pay.  °[facility] ° [address], [place] Phone: (336) 832-3150, Fax: (336) 832-3151. Hours of Operation:  8:30 am - 5:30 pm, M-F.  Also accepts Medicaid and self-pay.  °[facility] [place] [address], [place] Phone: (336) 878-6027   °[facility] [address], [place], [place] (336)723-1848, Ext. 123 Mondays & Thursdays: 7-9 AM.  First 15 patients are seen on a first come, first serve basis. °  ° °Medicaid-accepting [place] Providers: ° °Organization         Address  Phone   Notes  °[facility] [address], [place] (336) 641-2100 Also accepts self-pay patients.  °[facility] [address], [place] ° (336) 856-9996   °[facility] [address], [place]  (336) 288-8857   °[facility] [address] [place] [address], [place] (336) 299-7000   °Veita Bland [address], [place]  ° (336) 373-1557 Only accepts [place] Access Medicaid patients after they have their name applied to their card.  ° °Self-Pay (no insurance) in [place]: ° °Organization         Address  Phone   Notes  °Sickle Cell Patients, [facility] [address], [place] (336) 832-1970   °[facility] [address], [place] (336) 832-4400   °[facility] ° [address], [place] (336) 992-4800   °[facility]/Dr. Osei-Bonsu ° [address] [place] [address], [place] or [address], [place] (336) 841-8500 Phone number for both [place] and [place] locations is the same.  °[facility] [address], [place] (336) 299-0000   °[facility] [address] [place] [address], [place] or [address] (336) 852-7530 °(336) 878-2260   °[facility] [address], [place] (336) 350-1642, phone; (336) 294-5005, fax Sees patients 1st and 3rd Saturday of every month.  Must not qualify for public or private insurance (i.e. Medicaid, Medicare, [place] Health Choice, Veterans' Benefits) • Household income should be no more than 200% of the poverty level •The clinic cannot treat you if you are pregnant or think you are pregnant • Sexually transmitted diseases are not treated at the clinic.  ° ° °Dental Care: °Organization         Address  Phone  Notes  °[place] [facility] [address], [place] (336) 641-6152 Accepts children up to age 21 who are enrolled in Medicaid or [place] Health Choice; pregnant women with a Medicaid card; and children who have applied for Medicaid or [place] Health Choice, but were declined, whose parents can pay a reduced fee at time of service.  °[place]  [facility] [place]  [address], [place] (336) 641-7733 Accepts children up to age 21 who are enrolled in Medicaid or [place] Health Choice; pregnant women with a Medicaid card; and children who have applied for Medicaid or [place] Health Choice, but were declined, whose parents can pay a reduced fee at time of service.  °[facility] ° [address],  (336) 641-4533 Patients are seen by appointment only. Walk-ins are not accepted. [facility] will see patients 18 years of age and older. °Monday - Tuesday (8am-5pm) °Most Wednesdays (8:30-5pm) °$30 per visit, cash only  °[facility] ° [address], [place] (336) 641-4533 Patients are seen by appointment only. Walk-ins are not accepted. [facility] will see patients 18 years of age and older. °One   Wednesday Evening (Monthly: Volunteer Based).  $30 per visit, cash only  °[facility]  (919) 537-3737 for adults; Children under age 4, call Graduate Pediatric Dentistry at (919) [phone]. Children aged 4-14, please call (919) [phone] to request a pediatric application. ° Dental services are provided in all areas of dental care including fillings, crowns and bridges, complete and partial dentures, implants, gum treatment, root canals, and extractions. Preventive care is also provided. Treatment is provided to both adults and children. °Patients are selected via a lottery and there is often a waiting list. °  °[facility] [address], °[place] ° (336) 763-8833 [email or web address] °  °[facility] [address], [place], [place] (336)723-1848, Ext. 123 Second and Fourth Thursday of each month, opens at 6:30 AM; Clinic ends at 9 AM.  Patients are seen on a first-come first-served basis, and a limited number are seen during each clinic.  ° °[facility] ° [address], [place], [place] (336) 723-7904    Eligibility Requirements °You must have lived in [place], [place], or [place] counties for at least the last three months. °  You cannot be eligible for state or federal sponsored healthcare insurance, including Veterans Administration, Medicaid, or Medicare. °  You generally cannot be eligible for healthcare insurance through your employer.  °  How to apply: °Eligibility screenings are held every Tuesday and Wednesday afternoon from 1:00 pm until 4:00 pm. You do not need an appointment for the interview!  °[facility] [address], [place], [place] 336-631-2330   °[facility]  336-342-8273   °[facility]  336-703-3100   °[facility]  336-570-6415   ° °Behavioral Health Resources in the Community: °Intensive Outpatient Programs °Organization         Address  Phone  Notes  °[facility] [address], [place], [place] 336-878-6098   °[facility] [address], [place], [place] 336-832-9800   °[facility] [address], [place], [place] ° 336-882-2125   °[facility] [address],  °[place], [place] 1-800-853-5163 or 336-641-4981   °Substance Abuse Resources °Organization         Address  Phone  Notes  °[facility]  336-882-2125   °[facility]  336-784-9470   °[facility]  336-285-9073   °[facility]  336-845-3988   °Residential & Outpatient Substance Abuse Program  1-800-659-3381   °Psychological Services °Organization         Address  Phone  Notes  °[facility]  336- 832-9600   °[facility]  336- 378-7881   °[facility] [address], [place] 1-800-853-5163 or 336-641-4981   ° °Mobile Crisis Teams °Organization         Address  Phone  Notes  °[facility], Mobile Crisis Care Unit  1-877-626-1772   °Assertive °[facility] ° [address]  [place], [place] 336-834-9664   °Sharon DeEsch [address] °[place] 336-554-5454   ° °Self-Help/Support Groups °Organization         Address  Phone             Notes  °[facility] - variety of support groups  336- 373-1402 Call for more information  °Narcotics Anonymous (NA), [facility] [address], °[place]   2 meetings at this location  ° °  Residential Treatment Programs °Organization         Address  Phone  Notes  °[facility] [address],    °[place]  1-866-801-8205   °[facility] ° [address], [place], [place] 704-293-8524   °[facility] [address], [place] 336-845-3988 Admissions: 8am-3pm M-F  °[facility] [address]    °[place], [place] 336-841-1104   °[facility] [address], [place], [place] 336-379-7146   °[facility] [address]  °[place], [place] 336-285-9073   °[facility] Intensive Outpatient [address], [place], [place] 336-852-3033   °[facility] [address]  °[place], [place] 1-877-615-2722 or 336-784-9470   °[facility] [address] [place], [place] 336-227-7417 Accepts Medicaid  °[facility] [address]  °[place] 1-800-659-3381 Substance Abuse/Addiction Treatment  ° °[place] Behavioral Health Resources °Organization         Address  Phone  Notes  °[facility]  (888) 581-9988   °Julie Brannon, PhD [address] [place], [place]   (336) 349-5553 or (336) 951-0000   °[facility]   [address] °[place], [place] (336) 349-4454   °[facility] [address], [place], [place] (336) 342-8316 Insurance/Medicaid/sponsorship through [facility]  °[facility] [address]                                    [place], [place] (336) 342-8316 Therapy/tele-psych/case    °[facility] [address]  ° [place], [place] (336) 349-2233    °Dr. Arfeen  (336) 349-4544   °[facility] [place]  [employer] [place] [facility] 1) [address], [place] °2) [address], [place] °3)  [address], [place] (336) 349-3220 °(336) 342-7768 ° °(336) 342-8140   °[place] [facility] (336) 342-1394 or (336) 342-3537 (After Hours)    ° ° °Take the prescriptions as directed.  Call the Urologist tomorrow to schedule a follow up appointment in the next 2 days.  Return to the Emergency Department immediately if worsening. ° °

## 2014-09-04 NOTE — ED Notes (Signed)
McManus, MD at bedside.  

## 2014-09-05 ENCOUNTER — Ambulatory Visit (HOSPITAL_COMMUNITY): Payer: BC Managed Care – PPO | Admitting: Certified Registered Nurse Anesthetist

## 2014-09-05 ENCOUNTER — Encounter (HOSPITAL_COMMUNITY)
Admission: AD | Disposition: A | Discharge status: Home / Self Care | Payer: Self-pay | Source: Ambulatory Visit | Attending: Urology

## 2014-09-05 ENCOUNTER — Other Ambulatory Visit: Payer: Self-pay | Admitting: Urology

## 2014-09-05 ENCOUNTER — Encounter (HOSPITAL_COMMUNITY): Payer: Self-pay | Admitting: *Deleted

## 2014-09-05 ENCOUNTER — Ambulatory Visit (HOSPITAL_COMMUNITY)
Admission: AD | Admit: 2014-09-05 | Discharge: 2014-09-05 | Disposition: A | Discharge status: Home / Self Care | Payer: BC Managed Care – PPO | Source: Ambulatory Visit | Attending: Urology | Admitting: Urology

## 2014-09-05 DIAGNOSIS — Z841 Family history of disorders of kidney and ureter: Secondary | ICD-10-CM | POA: Insufficient documentation

## 2014-09-05 DIAGNOSIS — Z833 Family history of diabetes mellitus: Secondary | ICD-10-CM | POA: Diagnosis not present

## 2014-09-05 DIAGNOSIS — F17210 Nicotine dependence, cigarettes, uncomplicated: Secondary | ICD-10-CM | POA: Diagnosis not present

## 2014-09-05 DIAGNOSIS — F1099 Alcohol use, unspecified with unspecified alcohol-induced disorder: Secondary | ICD-10-CM | POA: Diagnosis not present

## 2014-09-05 DIAGNOSIS — N202 Calculus of kidney with calculus of ureter: Secondary | ICD-10-CM | POA: Diagnosis present

## 2014-09-05 DIAGNOSIS — Z880 Allergy status to penicillin: Secondary | ICD-10-CM | POA: Diagnosis not present

## 2014-09-05 DIAGNOSIS — I10 Essential (primary) hypertension: Secondary | ICD-10-CM | POA: Insufficient documentation

## 2014-09-05 HISTORY — PX: HOLMIUM LASER APPLICATION: SHX5852

## 2014-09-05 HISTORY — PX: CYSTOSCOPY WITH RETROGRADE PYELOGRAM, URETEROSCOPY AND STENT PLACEMENT: SHX5789

## 2014-09-05 HISTORY — DX: Personal history of urinary calculi: Z87.442

## 2014-09-05 SURGERY — CYSTOURETEROSCOPY, WITH RETROGRADE PYELOGRAM AND STENT INSERTION
Anesthesia: General | Site: Ureter | Laterality: Left

## 2014-09-05 MED ORDER — LACTATED RINGERS IV SOLN
INTRAVENOUS | Status: DC | PRN
  Administered 2014-09-05 (×2): via INTRAVENOUS

## 2014-09-05 MED ORDER — KETOROLAC TROMETHAMINE 30 MG/ML IJ SOLN
INTRAMUSCULAR | Status: AC
  Filled 2014-09-05: qty 1

## 2014-09-05 MED ORDER — 0.9 % SODIUM CHLORIDE (POUR BTL) OPTIME
TOPICAL | Status: DC | PRN
  Administered 2014-09-05: 1000 mL

## 2014-09-05 MED ORDER — FENTANYL CITRATE 0.05 MG/ML IJ SOLN
INTRAMUSCULAR | Status: AC
Start: 2014-09-05 — End: 2014-09-05
  Filled 2014-09-05: qty 5

## 2014-09-05 MED ORDER — PROMETHAZINE HCL 25 MG/ML IJ SOLN: 6.2500 mg | INTRAMUSCULAR | Status: DC | PRN

## 2014-09-05 MED ORDER — MIDAZOLAM HCL 2 MG/2ML IJ SOLN
INTRAMUSCULAR | Status: AC
  Filled 2014-09-05: qty 2

## 2014-09-05 MED ORDER — BELLADONNA ALKALOIDS-OPIUM 16.2-60 MG RE SUPP
RECTAL | Status: AC
  Filled 2014-09-05: qty 1

## 2014-09-05 MED ORDER — CIPROFLOXACIN IN D5W 400 MG/200ML IV SOLN
400.0000 mg | INTRAVENOUS | Status: AC
  Administered 2014-09-05: 400 mg via INTRAVENOUS

## 2014-09-05 MED ORDER — ONDANSETRON HCL 4 MG/2ML IJ SOLN
INTRAMUSCULAR | Status: AC
  Filled 2014-09-05: qty 2

## 2014-09-05 MED ORDER — FENTANYL CITRATE 0.05 MG/ML IJ SOLN
INTRAMUSCULAR | Status: DC | PRN
  Administered 2014-09-05 (×4): 50 ug via INTRAVENOUS
  Administered 2014-09-05: 25 ug via INTRAVENOUS

## 2014-09-05 MED ORDER — DEXAMETHASONE SODIUM PHOSPHATE 10 MG/ML IJ SOLN
INTRAMUSCULAR | Status: DC | PRN
  Administered 2014-09-05: 10 mg via INTRAVENOUS

## 2014-09-05 MED ORDER — ACETAMINOPHEN 10 MG/ML IV SOLN
1000.0000 mg | Freq: Once | INTRAVENOUS | Status: AC
  Administered 2014-09-05: 1000 mg via INTRAVENOUS
  Filled 2014-09-05: qty 100

## 2014-09-05 MED ORDER — FENTANYL CITRATE 0.05 MG/ML IJ SOLN
INTRAMUSCULAR | Status: AC
  Filled 2014-09-05: qty 2

## 2014-09-05 MED ORDER — IOHEXOL 300 MG/ML  SOLN
INTRAMUSCULAR | Status: DC | PRN
  Administered 2014-09-05: 10 mL

## 2014-09-05 MED ORDER — PHENAZOPYRIDINE HCL 200 MG PO TABS: 200.0000 mg | ORAL_TABLET | Freq: Three times a day (TID) | ORAL | Status: DC | PRN

## 2014-09-05 MED ORDER — PROPOFOL 10 MG/ML IV BOLUS
INTRAVENOUS | Status: DC | PRN
  Administered 2014-09-05: 200 mg via INTRAVENOUS

## 2014-09-05 MED ORDER — CIPROFLOXACIN IN D5W 400 MG/200ML IV SOLN
INTRAVENOUS | Status: AC
  Filled 2014-09-05: qty 200

## 2014-09-05 MED ORDER — BELLADONNA ALKALOIDS-OPIUM 16.2-60 MG RE SUPP
RECTAL | Status: DC | PRN
  Administered 2014-09-05: 1 via RECTAL

## 2014-09-05 MED ORDER — KETOROLAC TROMETHAMINE 30 MG/ML IJ SOLN
INTRAMUSCULAR | Status: DC | PRN
  Administered 2014-09-05: 30 mg via INTRAVENOUS

## 2014-09-05 MED ORDER — SODIUM CHLORIDE 0.9 % IR SOLN
Status: DC | PRN
  Administered 2014-09-05 (×3): 1000 mL
  Administered 2014-09-05: 3000 mL

## 2014-09-05 MED ORDER — LIDOCAINE HCL (CARDIAC) 20 MG/ML IV SOLN
INTRAVENOUS | Status: AC
  Filled 2014-09-05: qty 5

## 2014-09-05 MED ORDER — LIDOCAINE HCL (CARDIAC) 10 MG/ML IV SOLN
INTRAVENOUS | Status: DC | PRN
  Administered 2014-09-05: 100 mg via INTRAVENOUS

## 2014-09-05 MED ORDER — KETOROLAC TROMETHAMINE 30 MG/ML IJ SOLN: 15.0000 mg | Freq: Once | INTRAMUSCULAR | Status: DC | PRN

## 2014-09-05 MED ORDER — OXYCODONE-ACETAMINOPHEN 5-325 MG PO TABS: ORAL_TABLET | ORAL | Status: DC

## 2014-09-05 MED ORDER — PROPOFOL 10 MG/ML IV BOLUS
INTRAVENOUS | Status: AC
  Filled 2014-09-05: qty 20

## 2014-09-05 MED ORDER — FENTANYL CITRATE 0.05 MG/ML IJ SOLN: 25.0000 ug | INTRAMUSCULAR | Status: DC | PRN

## 2014-09-05 MED ORDER — ONDANSETRON HCL 4 MG/2ML IJ SOLN
INTRAMUSCULAR | Status: DC | PRN
Start: 2014-09-05 — End: 2014-09-05
  Administered 2014-09-05: 4 mg via INTRAVENOUS

## 2014-09-05 SURGICAL SUPPLY — 21 items
BAG URO CATCHER STRL LF (DRAPE) ×3 IMPLANT
BASKET ZERO TIP NITINOL 2.4FR (BASKET) ×1 IMPLANT
BSKT STON RTRVL ZERO TP 2.4FR (BASKET) ×2
CATH URET 5FR 28IN OPEN ENDED (CATHETERS) ×1 IMPLANT
CLOTH BEACON ORANGE TIMEOUT ST (SAFETY) ×3 IMPLANT
DRAPE CAMERA CLOSED 9X96 (DRAPES) ×3 IMPLANT
EXTRACTOR STONE NITINOL NGAGE (UROLOGICAL SUPPLIES) ×1 IMPLANT
FIBER LASER FLEXIVA 1000 (UROLOGICAL SUPPLIES) ×2 IMPLANT
FIBER LASER FLEXIVA 200 (UROLOGICAL SUPPLIES) ×3 IMPLANT
FIBER LASER FLEXIVA 365 (UROLOGICAL SUPPLIES) ×2 IMPLANT
FIBER LASER FLEXIVA 550 (UROLOGICAL SUPPLIES) ×2 IMPLANT
FIBER LASER TRAC TIP (UROLOGICAL SUPPLIES) ×2 IMPLANT
GLOVE SURG SS PI 8.0 STRL IVOR (GLOVE) IMPLANT
GOWN STRL REUS W/TWL XL LVL3 (GOWN DISPOSABLE) ×3 IMPLANT
GUIDEWIRE STR DUAL SENSOR (WIRE) ×1 IMPLANT
MANIFOLD NEPTUNE II (INSTRUMENTS) ×3 IMPLANT
PACK CYSTO (CUSTOM PROCEDURE TRAY) ×3 IMPLANT
SHEATH ACCESS URETERAL 38CM (SHEATH) ×1 IMPLANT
SHEATH URET 14/16 FRX35CM (MISCELLANEOUS) ×1 IMPLANT
STENT CONTOUR 6FRX26X.038 (STENTS) ×1 IMPLANT
TUBING CONNECTING 10 (TUBING) ×3 IMPLANT

## 2014-09-05 NOTE — Brief Op Note (Signed)
09/05/2014  8:12 PM  PATIENT:  Marie Lawrence  33 y.o. female  PRE-OPERATIVE DIAGNOSIS:  left ureteral and renal stones  POST-OPERATIVE DIAGNOSIS:  left ureteral and renal stones  PROCEDURE:  Procedure(s) with comments: CYSTOSCOPY WITH RETROGRADE PYELOGRAM, URETEROSCOPY, STONE BASKETRY AND STENT PLACEMENT (Left) HOLMIUM LASER APPLICATION (Left) - digital ureteroscope  SURGEON:  Surgeon(s) and Role:    * Anner CreteJohn J Brynley Cuddeback, MD - Primary  PHYSICIAN ASSISTANT:   ASSISTANTS: none   ANESTHESIA:   general  EBL:     BLOOD ADMINISTERED:none  DRAINS: left 6 x 26 JJ stent   LOCAL MEDICATIONS USED:  NONE  SPECIMEN:  Source of Specimen:  left ureteral and renal stones.  DISPOSITION OF SPECIMEN:  to family to bring to office  COUNTS:  YES  TOURNIQUET:  * No tourniquets in log *  DICTATION: .Other Dictation: Dictation Number [id number]  PLAN OF CARE: Discharge to home after PACU  PATIENT DISPOSITION:  PACU - hemodynamically stable.   Delay start of Pharmacological VTE agent (>24hrs) due to surgical blood loss or risk of bleeding: not applicable

## 2014-09-05 NOTE — Anesthesia Preprocedure Evaluation (Signed)
Anesthesia Evaluation  Patient identified by MRN, date of birth, ID band Patient awake    Reviewed: Allergy & Precautions, H&P , NPO status , Patient's Chart, lab work & pertinent test results  Airway Mallampati: II  TM Distance: >3 FB Neck ROM: Full    Dental no notable dental hx.    Pulmonary Current Smoker,  breath sounds clear to auscultation  Pulmonary exam normal       Cardiovascular hypertension, Pt. on medications Rhythm:Regular Rate:Normal     Neuro/Psych negative neurological ROS  negative psych ROS   GI/Hepatic negative GI ROS, Neg liver ROS,   Endo/Other  negative endocrine ROS  Renal/GU negative Renal ROS  negative genitourinary   Musculoskeletal negative musculoskeletal ROS (+)   Abdominal   Peds negative pediatric ROS (+)  Hematology negative hematology ROS (+)   Anesthesia Other Findings   Reproductive/Obstetrics negative OB ROS                             Anesthesia Physical Anesthesia Plan  ASA: II  Anesthesia Plan: General   Post-op Pain Management:    Induction: Intravenous  Airway Management Planned: LMA and Oral ETT  Additional Equipment:   Intra-op Plan:   Post-operative Plan: Extubation in OR  Informed Consent: I have reviewed the patients History and Physical, chart, labs and discussed the procedure including the risks, benefits and alternatives for the proposed anesthesia with the patient or authorized representative who has indicated his/her understanding and acceptance.   Dental advisory given  Plan Discussed with: CRNA and Surgeon  Anesthesia Plan Comments:         Anesthesia Quick Evaluation

## 2014-09-05 NOTE — H&P (Signed)
History of Present Illness Marie Lawrence is a 33yo BF who is sent from the ER with a 9mm left ureteral stone. She had the onset Saturday night of severe left flank pain with nausea and vomiting. She had no hematuria. She has had no voiding complaints. She has continued to have pain since being seen on Saturday. She has had no prior stones, UTI's or GU surgery.   Past Medical History Problems  1. History of hypertension (Z86.79)  Surgical History Problems  1. History of Cesarean Section  Current Meds 1. Naproxen 250 MG Oral Tablet;  Therapy: (Recorded:16Nov2015) to Recorded 2. Ondansetron 4 MG Oral Tablet Dispersible;  Therapy: (Recorded:16Nov2015) to Recorded 3. Oxycodone-Acetaminophen 5-325 MG Oral Tablet;  Therapy: (Recorded:16Nov2015) to Recorded 4. Tamsulosin HCl - 0.4 MG Oral Capsule;  Therapy: (Recorded:16Nov2015) to Recorded  Allergies Medication  1. Penicillins  Family History Problems  1. Family history of diabetes mellitus (Z83.3) : Father 2. Family history of renal failure (Z84.1) : Father  Social History Problems    Alcohol use (F10.99)   2 per week   Caffeine use (F15.90)   3 cups per day   Current some day smoker (F17.210)   1/2 pack per day   Death in the family, father   Renal failure/Diabetes (Age 33)   Number of children   1 Son & 1 Daughter   Occupation   Social research officer, governmenttore Manager   Single  Review of Systems Genitourinary, constitutional, skin, eye, otolaryngeal, hematologic/lymphatic, cardiovascular, pulmonary, endocrine, musculoskeletal, gastrointestinal, neurological and psychiatric system(s) were reviewed and pertinent findings if present are noted and are otherwise negative.  Gastrointestinal: nausea, vomiting and flank pain.  Endocrine: polydipsia.    Vitals Vital Signs [Data Includes: Last 1 Day]  Recorded: 16Nov2015 02:55PM  Height: 5 ft 5 in Weight: 175 lb  BMI Calculated: 29.12 BSA Calculated: 1.87 Blood Pressure: 153 /  98 Temperature: 97.4 F  Physical Exam Constitutional: Well nourished and well developed . No acute distress.  ENT:. The ears and nose are normal in appearance.  Neck: The appearance of the neck is normal and no neck mass is present.  Pulmonary: No respiratory distress and normal respiratory rhythm and effort.  Cardiovascular: Heart rate and rhythm are normal . No peripheral edema.  Abdomen: The abdomen is mildly obese. No masses are palpated. Moderate tenderness in the LLQ is present. moderate left CVA tenderness. No hernias are palpable. No hepatosplenomegaly noted.  Lymphatics: The posterior cervical and supraclavicular nodes are not enlarged or tender.  Skin: Normal skin turgor, no visible rash and no visible skin lesions.  Neuro/Psych:. Mood and affect are appropriate.    Results/Data Urine [Data Includes: Last 1 Day]   16Nov2015  COLOR AMBER   APPEARANCE CLOUDY   SPECIFIC GRAVITY 1.020   pH 7.0   GLUCOSE NEG mg/dL  BILIRUBIN NEG   KETONE NEG mg/dL  BLOOD LARGE   PROTEIN TRACE mg/dL  UROBILINOGEN 0.2 mg/dL  NITRITE NEG   LEUKOCYTE ESTERASE MOD   SQUAMOUS EPITHELIAL/HPF MANY   WBC 3-6 WBC/hpf  RBC 11-20 RBC/hpf  BACTERIA MODERATE   CRYSTALS NONE SEEN   CASTS NONE SEEN    The following images/tracing/specimen were independently visualized:  CT films reviewed.  The following clinical lab reports were reviewed:  UA and hospital labs reviewed. She had a negative culture in October. Her UA in the ER just had RBC's. She had a mild leukocytosis but the chemistries are ok.    Assessment Assessed  1. Calculus of left ureter (N20.1)  2. Nephrolithiasis (N20.0)  She has a 9x6713mm left proximal stone with pain and bilateral renal stones.   Plan Health Maintenance  1. UA With REFLEX; [Do Not Release]; Status:Resulted - Requires Verification;   Done:  16Nov2015 02:17PM  As she remains symptomatic I am going to take her to the OR tonight for ureteroscopy with laser and stenting.  I have reviewed the risks of bleeding, infection, ureteral injury, need for secondary procedures, stenting, thrombotic events and anesthesia risks.   Discussion/Summary [facility].

## 2014-09-05 NOTE — Discharge Instructions (Signed)
Ureteral Stent Implantation Ureteral stent implantation is the implantation of a soft plastic tube with multiple holes into the tube that drains urine from your kidney to your bladder (ureter). The stent helps drain your kidney when there is a blockage of the flow of urine in your ureter. The stent has a coil on each end to keep it from falling out. One end stays in the kidney. The other end stays in the bladder. It is most often taken out after any blockage has been removed or your ureter has healed. Short-term stents have a string attached to make removal quite easy. Removal of a short-term stent can be done in your health care provider's office or by you at home. Long-term stents need to be changed every few months. LET [facility]OUR HEALTH CARE PROVIDER KNOW ABOUT:  Any allergies you have.  All medicines you are taking, including vitamins, herbs, eye drops, creams, and over-the-counter medicines.  Previous problems you or members of your family have had with the use of anesthetics.  Any blood disorders you have.  Previous surgeries you have had.  Medical conditions you have. RISKS AND COMPLICATIONS Generally, ureteral stent implantation is a safe procedure. However, as with any procedure, complications can occur. Possible complications include:  Movement of the stent away from where it was originally placed (migration). This may affect the ability of the stent to properly drain your kidney. If migration of the stent occurs, the stent may need to be replaced or repositioned.  Perforation of the ureter.  Infection. BEFORE THE PROCEDURE  You may be asked to wash your genital area with sterile soap the morning of your procedure.  You may be given an oral antibiotic which you should take with a sip of water as prescribed by your health care provider.  You may be asked to not eat or drink for 8 hours before the surgery. PROCEDURE  First you will be given an anesthetic so you do not feel pain  during the procedure.  Your health care provider will insert a special lighted instrument called a cystoscope into your bladder. This allows your health care provider to see the opening to your ureter.  A thin wire is carefully threaded into your bladder and up the ureter. The stent is inserted over the wire and the wire is then removed.  Your bladder will be emptied of urine. AFTER THE PROCEDURE You will be taken to a recovery room until it is okay for you to go home. Document Released: 10/04/2000 Document Revised: 10/12/2013 Document Reviewed: 03/16/2013 [facility] Patient Information 2015 [place]. This information is not intended to replace advice given to you by your health care provider. Make sure you discuss any questions you have with your health care provider.   You may pull the stent by the attached string on Thursday morning.  If you don't find the string or don't feel comfortable doing that please contact the office to come in on Thursday to have it removed.

## 2014-09-05 NOTE — Transfer of Care (Signed)
Immediate Anesthesia Transfer of Care Note  Patient: Marie KellerShanda S Mitten  Procedure(s) Performed: Procedure(s) with comments: CYSTOSCOPY WITH RETROGRADE PYELOGRAM, URETEROSCOPY, STONE BASKETRY AND STENT PLACEMENT (Left) HOLMIUM LASER APPLICATION (Left) - digital ureteroscope  Patient Location: PACU  Anesthesia Type:General  Level of Consciousness: awake, alert , oriented and patient cooperative  Airway & Oxygen Therapy: Patient Spontanous Breathing and Patient connected to face mask oxygen  Post-op Assessment: Report given to PACU RN, Post -op Vital signs reviewed and stable and Patient moving all extremities X 4  Post vital signs: stable  Complications: No apparent anesthesia complications

## 2014-09-06 ENCOUNTER — Encounter (HOSPITAL_COMMUNITY): Payer: Self-pay | Admitting: Urology

## 2014-09-06 NOTE — Op Note (Signed)
[name]:  Odaniel, [name]               ACCOUNT NO.:  [id number]  MEDICAL RECORD NO.:  [id number]  LOCATION:  [facility]                         [facility]  PHYSICIAN:  Excell SeltzerJohn J. Annabell HowellsWrenn, M.D.    DATE OF BIRTH:  1980-12-06  DATE OF PROCEDURE:  09/05/2014 DATE OF DISCHARGE:  09/05/2014                              OPERATIVE REPORT   PROCEDURE:  Cystoscopy with left retrograde pyelogram and interpretation.  Left ureteroscopy with holmium lasertripsy and stone extraction of a left ureteral stone.  Left ureteroscopy and extraction of left renal stone.  Insertion of left double-J stent.  PREOPERATIVE DIAGNOSIS:  A 9 x 13 mm left proximal ureteral stone and left upper pole stone.  SURGEON:  Excell SeltzerJohn J. Annabell HowellsWrenn, MD  ANESTHESIA:  General.  SPECIMEN:  Stone fragments.  DRAINS:  A 6-French 26-cm double-J stent with string.  BLOOD LOSS:  Minimal.  COMPLICATIONS:  None.  INDICATIONS:  Ms. Marie Lawrence is a 33 year old, African-American female, who was sent from the ER with an obstructing left proximal ureteral stone that measured approximately 13 x 9 mm on my measurements.  There was also a smaller stone in the upper pole of the kidney about 6 mm.  It was felt that because of her ongoing pain complaints that ureteroscopy this evening was indicated versus delayed lithotripsy.  FINDINGS OF PROCEDURE:  She was taken to the operating room where general anesthetic was induced.  She was given Cipro.  She was placed in lithotomy position and fitted with PAS hose.  Her perineum and genitalia were prepped with Betadine solution, she was draped in usual sterile fashion.  A B and O suppository had been placed as part of the prep.  Cystoscopy was performed using the 22-French scope and 12-degree lens. Examination revealed a normal urethra.  The bladder wall had mild trabeculation.  No tumors or stones were noted, but there were scattered lesions consistent with follicular cystitis.  The ureteral orifices  were unremarkable.  The left ureteral orifice was cannulated with 5-French open-end catheter and contrast was instilled.  This demonstrated a filling defect in the proximal ureter consistent with a stone seen on CT.  The contrast did efflux proximally with partial filling and dilation.  The distal ureter had normal caliber.  A guidewire was then passed through the kidney through the open-end catheter by the stone.  The open-end catheter was removed along with the cystoscope, and a 14-French introducer sheath inner core was used to dilate the ureter to the level of the stone and passed very easily.  At this point, a 6.5-French short ureteroscope was inserted alongside the wire and was able to easily traverse the ureter to the level of the stone.  The stone had a yellowish tan appearance suggestive of possible struvite versus a mixed calcium.  The stone was then engaged with a 200 micron laser fiber set on 0.5 watts and 50 hertz and was broken into manageable fragments.  These fragments were then retrieved with a Nitinol basket.  One fragment had to be disengaged in the distal ureter and additional laser applications were used to fragment it sufficiently to remove.  At this point, the bladder was evacuated free of  the stones and the ureteroscope was reinserted with inspection up to the UPJ and no additional significant stones were noted.  At this point, a 12 x 14 x 38-cm digital access sheath was passed over the wire to the level of the kidney.  The core and wire were removed and the digital ureteroscope was then passed.  The bloody urine was irrigated from the kidney and inspection in the kidney demonstrated some residual fragments from the ureteral lasering in the upper pole calices, these were retrieved with a basket.  Additionally, another larger stone fragment that was felt to be independent of the ureteral stone measured approximately 6 mm was basketed and removed intact.   There was another opacity overlying the upper pole but it was not within the upper pole calices, it could be a large Randall plaque or possibly stone within a caliceal diverticulum but I was unable to access it.  At the end of the procedure, the only opacity was over the upper pole.  The ureteroscope was removed.  The guidewire was replaced through the access sheath.  The access sheath was removed.  The cystoscope was inserted over the wire and a 6-French 26-cm double-J stent with string was inserted over the wire to the kidney.  The wire was removed leaving good coil in the kidney and good coil in the bladder.  The bladder was drained.  The cystoscope was removed leaving the stent string exiting urethra.  The string was tied close to the meatus and trimmed and then tucked vaginally.  The patient was taken down from lithotomy position.  Her anesthetic was reversed.  She was moved to recovery room in stable condition.  There were no complications.     Excell SeltzerJohn J. Annabell HowellsWrenn, M.D.     JJW/MEDQ  D:  09/05/2014  T:  09/06/2014  Job:  [id number]

## 2014-09-07 ENCOUNTER — Encounter: Payer: Self-pay | Admitting: Family Medicine

## 2014-09-07 DIAGNOSIS — N200 Calculus of kidney: Secondary | ICD-10-CM | POA: Insufficient documentation

## 2014-09-07 HISTORY — DX: Calculus of kidney: N20.0

## 2014-09-08 NOTE — Anesthesia Postprocedure Evaluation (Signed)
  Anesthesia Post-op Note  Patient: Marie KellerShanda S Phillipson  Procedure(s) Performed: Procedure(s) (LRB): CYSTOSCOPY WITH RETROGRADE PYELOGRAM, URETEROSCOPY, STONE BASKETRY AND STENT PLACEMENT (Left) HOLMIUM LASER APPLICATION (Left)  Patient Location: PACU  Anesthesia Type: General  Level of Consciousness: awake and alert   Airway and Oxygen Therapy: Patient Spontanous Breathing  Post-op Pain: mild  Post-op Assessment: Post-op Vital signs reviewed, Patient's Cardiovascular Status Stable, Respiratory Function Stable, Patent Airway and No signs of Nausea or vomiting  Last Vitals:  Filed Vitals:   09/05/14 2115  BP: 171/100  Pulse:   Temp: 36.6 C  Resp:     Post-op Vital Signs: stable   Complications: No apparent anesthesia complications

## 2014-10-05 ENCOUNTER — Ambulatory Visit (INDEPENDENT_AMBULATORY_CARE_PROVIDER_SITE_OTHER): Payer: BC Managed Care – PPO | Admitting: *Deleted

## 2014-10-05 DIAGNOSIS — Z3042 Encounter for surveillance of injectable contraceptive: Secondary | ICD-10-CM

## 2014-10-05 LAB — POCT URINE PREGNANCY: Preg Test, Ur: NEGATIVE

## 2014-10-05 MED ORDER — MEDROXYPROGESTERONE ACETATE 150 MG/ML IM SUSP
150.0000 mg | Freq: Once | INTRAMUSCULAR | Status: AC
  Administered 2014-10-05: 150 mg via INTRAMUSCULAR

## 2014-10-05 NOTE — Progress Notes (Signed)
   Pt late or Depo Provera injection.  Pregnancy test ordered; results negative.  Pt stated last sex for "a while ago".  Pt advised to use another reliable form of birth control for the next 7-10 days.  Pt tolerated Depo injection. Depo given left upper outer quadrant.  Next injection due March 3-January 05, 2015.  Reminder card given. Clovis PuMartin, Dixie Coppa L, RN

## 2015-01-05 ENCOUNTER — Ambulatory Visit (INDEPENDENT_AMBULATORY_CARE_PROVIDER_SITE_OTHER): Payer: No Typology Code available for payment source | Admitting: *Deleted

## 2015-01-05 DIAGNOSIS — Z3042 Encounter for surveillance of injectable contraceptive: Secondary | ICD-10-CM

## 2015-01-05 MED ORDER — MEDROXYPROGESTERONE ACETATE 150 MG/ML IM SUSP
150.0000 mg | Freq: Once | INTRAMUSCULAR | Status: AC
  Administered 2015-01-05: 150 mg via INTRAMUSCULAR

## 2015-01-05 NOTE — Progress Notes (Signed)
   Pt in for Depo Provera injection.  Pt tolerated Depo injection. Depo given right upper outer quadrant.  Next injection due June 2-April 06, 2015.  Pt advised she should have an anuual exam before next injection or will next injection.  Reminder card given. Clovis PuMartin, Tamika L, RN

## 2015-06-20 ENCOUNTER — Other Ambulatory Visit (HOSPITAL_COMMUNITY)
Admission: RE | Admit: 2015-06-20 | Discharge: 2015-06-20 | Disposition: A | Discharge status: Home / Self Care | Payer: Medicaid Other | Source: Ambulatory Visit | Attending: Family Medicine | Admitting: Family Medicine

## 2015-06-20 ENCOUNTER — Encounter: Payer: Self-pay | Admitting: Family Medicine

## 2015-06-20 ENCOUNTER — Ambulatory Visit (INDEPENDENT_AMBULATORY_CARE_PROVIDER_SITE_OTHER): Payer: Medicaid Other | Admitting: Family Medicine

## 2015-06-20 VITALS — BP 156/96 | HR 75 | Temp 98.2°F | Ht 65.0 in | Wt 189.0 lb

## 2015-06-20 DIAGNOSIS — Z124 Encounter for screening for malignant neoplasm of cervix: Secondary | ICD-10-CM

## 2015-06-20 DIAGNOSIS — I10 Essential (primary) hypertension: Secondary | ICD-10-CM

## 2015-06-20 DIAGNOSIS — Z720 Tobacco use: Secondary | ICD-10-CM | POA: Diagnosis not present

## 2015-06-20 DIAGNOSIS — Z0000 Encounter for general adult medical examination without abnormal findings: Secondary | ICD-10-CM | POA: Diagnosis not present

## 2015-06-20 DIAGNOSIS — N912 Amenorrhea, unspecified: Secondary | ICD-10-CM | POA: Diagnosis not present

## 2015-06-20 DIAGNOSIS — Z1151 Encounter for screening for human papillomavirus (HPV): Secondary | ICD-10-CM | POA: Insufficient documentation

## 2015-06-20 DIAGNOSIS — Z30013 Encounter for initial prescription of injectable contraceptive: Secondary | ICD-10-CM | POA: Diagnosis not present

## 2015-06-20 DIAGNOSIS — Z01419 Encounter for gynecological examination (general) (routine) without abnormal findings: Secondary | ICD-10-CM | POA: Insufficient documentation

## 2015-06-20 DIAGNOSIS — F17200 Nicotine dependence, unspecified, uncomplicated: Secondary | ICD-10-CM

## 2015-06-20 LAB — COMPREHENSIVE METABOLIC PANEL
ALK PHOS: 98 U/L (ref 33–115)
ALT: 10 U/L (ref 6–29)
AST: 10 U/L (ref 10–30)
Albumin: 3.8 g/dL (ref 3.6–5.1)
BILIRUBIN TOTAL: 0.5 mg/dL (ref 0.2–1.2)
BUN: 8 mg/dL (ref 7–25)
CALCIUM: 8.9 mg/dL (ref 8.6–10.2)
CO2: 27 mmol/L (ref 20–31)
Chloride: 107 mmol/L (ref 98–110)
Creat: 0.73 mg/dL (ref 0.50–1.10)
GLUCOSE: 96 mg/dL (ref 65–99)
POTASSIUM: 3.2 mmol/L — AB (ref 3.5–5.3)
Sodium: 141 mmol/L (ref 135–146)
TOTAL PROTEIN: 6.9 g/dL (ref 6.1–8.1)

## 2015-06-20 LAB — CBC
HCT: 36.4 % (ref 36.0–46.0)
HEMOGLOBIN: 12.2 g/dL (ref 12.0–15.0)
MCH: 28.3 pg (ref 26.0–34.0)
MCHC: 33.5 g/dL (ref 30.0–36.0)
MCV: 84.5 fL (ref 78.0–100.0)
MPV: 11 fL (ref 8.6–12.4)
Platelets: 300 10*3/uL (ref 150–400)
RBC: 4.31 MIL/uL (ref 3.87–5.11)
RDW: 14.3 % (ref 11.5–15.5)
WBC: 10.7 10*3/uL — AB (ref 4.0–10.5)

## 2015-06-20 LAB — LIPID PANEL
CHOLESTEROL: 151 mg/dL (ref 125–200)
HDL: 38 mg/dL — ABNORMAL LOW (ref >=46)
LDL Cholesterol: 103 mg/dL (ref <130)
TRIGLYCERIDES: 50 mg/dL (ref <150)
Total CHOL/HDL Ratio: 4 Ratio (ref <=5.0)
VLDL: 10 mg/dL (ref <30)

## 2015-06-20 LAB — POCT URINE PREGNANCY: Preg Test, Ur: NEGATIVE

## 2015-06-20 MED ORDER — MEDROXYPROGESTERONE ACETATE 150 MG/ML IM SUSP
150.0000 mg | Freq: Once | INTRAMUSCULAR | Status: AC
  Administered 2015-06-20: 150 mg via INTRAMUSCULAR

## 2015-06-20 NOTE — Assessment & Plan Note (Signed)
34 y/o female presents for wellness visit. -encouraged lifestyle modifications -up to date on immunizations -Pap smear obtained today -encouraged safe sex practices -Depo shot provided today -routine labs BMP/CBC/Lipid Profile

## 2015-06-20 NOTE — Assessment & Plan Note (Signed)
Blood Pressure elevated. Likely due to medication nonadherence. -patient counseled to take medications all before bed, place alarm on phone -continue Amlodipine 5 mg, Enalapril 10 and HCTZ 25 mg -if blood pressure continues to be uncontrolled consider workup for secondary causes -return for BP check in 2 weeks -suspect that if patient takes medications regularly may be able to wean regimen to 2 medications -check BMP to monitor renal function

## 2015-06-20 NOTE — Assessment & Plan Note (Signed)
Patient in pre-contemplative stage of quitting. Smoking 1/2 pack per day. -encouraged smoking cessation -offered pharmacotherapy and patient will consider for next visit

## 2015-06-20 NOTE — Patient Instructions (Addendum)
Thank you for coming in today!  For your blood pressure, try taking them before bed, working them into your routine, you can set a reminder on your phone too. Good idea in taking healthier food to work to eat. Make an appointment for a blood pressure check in 2 weeks, we can also talk about helping you quit smoking. You got your depo today. Please return in 3 months for repeat injection.  Dr. Randolm Idol will call you with the results of your pap smear.

## 2015-06-20 NOTE — Progress Notes (Signed)
Subjective:     Patient ID: Marie Lawrence, female   DOB: [birth year], 34 y.o.   MRN: [id number]   Documentation in black by Bing Quarry [place]isan[place] MS3.  HPI Marie Lawrence is a 34 y.o female with a PMHx of HTN who is here for a well check visit.   WELL VISIT  Diet: Consists mainly of hamburgers, and chicken, she eats salads for lunch sometimes, but does go some days without eating until dinner. She rarely eats fruits or vegetables.   Exercise: 2x / week, runs on the treadmill at the gym.   Social: Continues to smoke 1/2 ppd, has smoked for 10 years, is contemplating quitting but she is not ready. Has tried in the past cold [place]. Drinks occasionally around 2-4x/ month and drinks around 1-2 drinks. Denies drug use. Has a long term female partner, happy, feels safe in the relationship, uses condoms intermittently. Patient works as a Oncologist at [employer].  Gyn: uses depo shot as birth control but uses it inconsistently, wants to get one today. Does not want to use another form of birth control when given options. Last Pap smear was in 2013, she is due for one.  Denies discharge, itchiness, irritation. [id number]  PHQ2- negative  HTN: Takes medications around 50% of the time. She forgets because of her work. She does not monitor her blood pressure at home. She does get headaches at work due to the heat. Headaches are located at the top of her head bilaterally, she states it is worse when she puts her hear up for work and better when she puts her hair down.  She denies any aura, N/V, visual disturbances. HA not associated with vision changes, no phono- or photophobia. No alleviating or exacerbating factors.   Review of Systems Pertinent ROS as per HPI    Objective:   Physical Exam Vitals: reviewed, BP elevated however improved by end of visit. Gen: NAD, well appearing.  HEENT: PERRL, Normocephalc atraumatic, oral cavity normal no lesions or signs of infection. Neck supple no lymphadenopathy, no  thyromegaly. Bilateral TM pearly grey,. CV: RRR, nl S1 S2 no m/r/g PULM: BTAB, NWOB, no w/c/r GI: NABS, s/nt/nd no organomegaly NERUO:  AOx3, Normal ROM. Normal gait. Normal tone.  GU: No external lesions or discoloration. On speculum exam: scant grey non-odorous fluid in the vaginal vault, no masses or lesions on vaginal mucosa. On cervical exam: small <1cm cyst on the cervix, no signs of malignancy or infection, no blood, tenderness or discharge on exam. Bimanual exam unremarkable.     Assessment and Plan   Marie Lawrence is a 34 y.o female with uncontrolled HTN and tobacco abuse.   HEALTH MAINTENANCE: Patient's poor diet, uncontrolled HTN and tobacco use does put her at risk of CAD and stroke. Patient is contemplating quitting smoking.  - Counciled patient on taking healthier food to work to avoid eating unhealthily. - follow up visit in 2 weeks, will discuss smoking cessation further and check BP. - Pap smear with viral testing performed. Will call w/ results - CBC, CMP and Lipid panel.   HTN: Patient is forgetting to take her medications b/c her job is very busy.  - Advised the patient to take her medications before bedtime to help develop a routine and to increase adherence. Patient was amenable - Will check BP at follow up visit in 2 weeks.   Cerise Lieber du [place] MS3  I personally saw and evaluated the patient. I have reviewed the medical student note and agree  with the documentation. I personally completed the physical exam. Additions to the medical student note are made in blue.   Donnella Sham MD

## 2015-06-21 LAB — CYTOLOGY - PAP

## 2015-06-23 ENCOUNTER — Telehealth: Payer: Self-pay | Admitting: Family Medicine

## 2015-06-23 DIAGNOSIS — E876 Hypokalemia: Secondary | ICD-10-CM

## 2015-06-23 MED ORDER — POTASSIUM CHLORIDE CRYS ER 20 MEQ PO TBCR: 20.0000 meq | EXTENDED_RELEASE_TABLET | Freq: Every day | ORAL | Status: DC

## 2015-06-23 NOTE — Telephone Encounter (Signed)
Discussed lab work and pap smear results. Potassium level low. Will send in short course of potassium. Check at upcoming BP check. Patient reports compliance with BP meds (taking at night).

## 2015-06-23 NOTE — Assessment & Plan Note (Signed)
K 3.2  -short course of potassium sent to pharmacy.

## 2015-08-05 ENCOUNTER — Encounter (HOSPITAL_COMMUNITY): Payer: Self-pay | Admitting: Emergency Medicine

## 2015-08-05 ENCOUNTER — Emergency Department (HOSPITAL_COMMUNITY)
Admission: EM | Admit: 2015-08-05 | Discharge: 2015-08-05 | Disposition: A | Discharge status: Home / Self Care | Payer: Medicaid Other | Attending: Emergency Medicine | Admitting: Emergency Medicine

## 2015-08-05 DIAGNOSIS — K029 Dental caries, unspecified: Secondary | ICD-10-CM | POA: Insufficient documentation

## 2015-08-05 DIAGNOSIS — Z87442 Personal history of urinary calculi: Secondary | ICD-10-CM | POA: Insufficient documentation

## 2015-08-05 DIAGNOSIS — Z880 Allergy status to penicillin: Secondary | ICD-10-CM | POA: Insufficient documentation

## 2015-08-05 DIAGNOSIS — K047 Periapical abscess without sinus: Secondary | ICD-10-CM | POA: Insufficient documentation

## 2015-08-05 DIAGNOSIS — I10 Essential (primary) hypertension: Secondary | ICD-10-CM

## 2015-08-05 DIAGNOSIS — Z720 Tobacco use: Secondary | ICD-10-CM | POA: Insufficient documentation

## 2015-08-05 DIAGNOSIS — Z79899 Other long term (current) drug therapy: Secondary | ICD-10-CM | POA: Insufficient documentation

## 2015-08-05 DIAGNOSIS — Z8742 Personal history of other diseases of the female genital tract: Secondary | ICD-10-CM | POA: Insufficient documentation

## 2015-08-05 MED ORDER — HYDROCHLOROTHIAZIDE 25 MG PO TABS: 50.0000 mg | ORAL_TABLET | Freq: Every day | ORAL | Status: DC

## 2015-08-05 MED ORDER — ENALAPRIL MALEATE 10 MG PO TABS: 10.0000 mg | ORAL_TABLET | Freq: Every day | ORAL | Status: DC

## 2015-08-05 MED ORDER — CLINDAMYCIN HCL 150 MG PO CAPS: 300.0000 mg | ORAL_CAPSULE | Freq: Three times a day (TID) | ORAL | Status: DC

## 2015-08-05 MED ORDER — IBUPROFEN 800 MG PO TABS: 800.0000 mg | ORAL_TABLET | Freq: Three times a day (TID) | ORAL | Status: DC

## 2015-08-05 MED ORDER — AMLODIPINE BESYLATE 5 MG PO TABS: 5.0000 mg | ORAL_TABLET | Freq: Every day | ORAL | Status: DC

## 2015-08-05 NOTE — Discharge Instructions (Signed)
Take the prescribed medication as directed.  Make sure to take your blood pressure medications regularly. Follow-up with dentist-- call Monday to make appointment. Return to the ED for new or worsening symptoms.   Emergency Department Resource Guide 1) Find a Doctor and Pay Out of Pocket Although you won't have to find out who is covered by your insurance plan, it is a good idea to ask around and get recommendations. You will then need to call the office and see if the doctor you have chosen will accept you as a new patient and what types of options they offer for patients who are self-pay. Some doctors offer discounts or will set up payment plans for their patients who do not have insurance, but you will need to ask so you aren't surprised when you get to your appointment.  2) Contact Your Local Health Department Not all health departments have doctors that can see patients for sick visits, but many do, so it is worth a call to see if yours does. If you don't know where your local health department is, you can check in your phone book. The CDC also has a tool to help you locate your state's health department, and many state websites also have listings of all of their local health departments.  3) Find a Walk-in Clinic If your illness is not likely to be very severe or complicated, you may want to try a walk in clinic. These are popping up all over the country in pharmacies, drugstores, and shopping centers. They're usually staffed by nurse practitioners or physician assistants that have been trained to treat common illnesses and complaints. They're usually fairly quick and inexpensive. However, if you have serious medical issues or chronic medical problems, these are probably not your best option.  No Primary Care Doctor: - Call Health Connect at  [phone] - they can help you locate a primary care doctor that  accepts your insurance, provides certain services, etc. - Physician Referral Service-  [phone]  Chronic Pain Problems: Organization         Address  Phone   Notes  [facility]  [phone] Patients need to be referred by their primary care doctor.   Medication Assistance: Organization         Address  Phone   Notes  [facility] [address] [place], [place] [address] [phone] --Must be a resident of [place] [place] -- Must have NO insurance coverage whatsoever (no Medicaid/ Medicare, etc.) -- The pt. MUST have a primary care doctor that directs their care regularly and follows them in the community   MedAssist  [phone]   [employer]  [phone]) [phone]    Agencies that provide inexpensive medical care: Organization         Address  Phone   Notes  [facility]  [phone]   [facility]    [phone]   [facility] [address] [place], [place] [address] [phone]   [facility] [place] [address], [place] [phone]   [facility]anned [facility]    [phone]   [facility]    [phone]) [phone]   [facility]  [address], [place] Phone:  [phone], Fax:  [phone] Hours of Operation:  9 am - 6 pm, M-F.  Also accepts Medicaid/Medicare and self-pay.  [facility]  [place], [address], [place] Phone: [phone], Fax: [phone]. Hours of Operation:  8:30 am - 5:30 pm, M-F.  Also accepts Medicaid and self-pay.  [facility] [address], [place] Phone: [phone]   [place], [place], [place] [phone], Ext. [phone] Mondays & Thursdays: 7-9 AM.  First 15 patients are seen on a first come, first serve basis.    [place] Providers:  Organization         Address  Phone   Notes  [facility] [address],  [place] [phone] Also accepts self-pay patients.  [facility] [address], [place]  [phone]   [place], [address], [place] [phone]   [facility] [address] [place] [phone]   Lucianne Lei [address], [place]   [phone] Only accepts [place] Access [place] patients after they have their name applied to their card.   Self-Pay (no insurance) in [facility]:  Organization         Address  Phone   Notes  Sickle Cell Patients, [facility] [place] [phone]   [facility] [place] [phone]   [facility] [place]  [place], [place], [place] [phone]   [facility]/Dr. Osei-Bonsu  [address], [place] or [address], [place] [phone] Phone number for both [place] and [place] locations is the same.  [facility] [address], [place] [phone]   [facility] [address] [place] or [address] [phone]   [facility] [address] [place] [phone], phone; [phone], fax Sees patients 1st and 3rd Saturday of every month.  Must not qualify for public or private insurance (i.e. Medicaid, Medicare, [place] Health Choice, Veterans' Benefits)  Household income should be no more than 200% of the poverty level The clinic cannot treat you if you are pregnant or think you are pregnant  Sexually transmitted diseases are not treated at the clinic.    Dental Care: Organization         Address  Phone  Notes  [facility] Department of [facility] [place] [phone] Accepts children up to age 50 who are enrolled in  [place] or [place]; pregnant women with a Medicaid card; and children who have applied for Medicaid or [place] Health Choice, but were declined, whose parents can pay a reduced fee at time of service.  [facility] Department of [facility]  [address], [place] [phone] Accepts children up to age 26 who are enrolled in [place] or [place]; pregnant women with a Medicaid card; and children who have applied for Medicaid or [place] Health Choice, but were declined, whose parents can pay a reduced fee at time of service.  [facility] Access PROGRAM  [place] [phone] Patients are seen by appointment only. Walk-ins are not accepted. [place] will see patients 70 years of age and older. Monday - Tuesday (8am-5pm) Most Wednesdays (8:30-5pm) $30 per visit, cash only  [place] Adult  Dental Access PROGRAM  [address], [facility] [phone] Patients are seen by appointment only. Walk-ins are not accepted. [place] will see patients 42 years of age and older. One Wednesday Evening (Monthly: Volunteer Based).  $30 per visit, cash only  [place]  [phone] for adults; Children under age 54, call Graduate Pediatric Dentistry at [phone]. Children aged 65-14, please call [phone] to request a pediatric application.  Dental services are provided in all areas of dental care including fillings, crowns and bridges, complete and partial dentures, implants, gum treatment, root canals, and extractions. Preventive care is also provided. Treatment is provided to both adults and children. Patients are selected via a lottery and there is often a waiting list.   [facility] [address] [place]  [phone] [email or web address]   [facility] [address] [place], [place] [phone], Ext. [phone] Second and Fourth Thursday of each month, opens at 6:30  AM; Clinic ends at 9 AM.  Patients are seen on a first-come first-served basis, and a limited number are seen during each clinic.   [facility]  [address] Hillard Danker [place], [place] [phone]   Eligibility Requirements You must have lived in [place], [place], or [place] counties for at least the last three months.   You cannot be eligible for state or federal sponsored [employer], including [employer], [place], or [employer].   You generally cannot be eligible for healthcare insurance through your employer.    How to apply: Eligibility screenings are held every Tuesday and Wednesday afternoon from 1:00 pm until 4:00 pm. You do not need an appointment for the interview!  [facility] [address] [place], [place]   [place]  [place] Department  [place]  [phone]    Behavioral Health Resources in the Community: Intensive Outpatient Programs Organization         Address  Phone  Notes  [place]. [address], [place], [place] [phone]   [facility] Outpatient [address], [place], [place]   ADS: Alcohol & Drug Svcs [address] [place], [place]   [place] [address]  [place], [place] or [phone]   Substance Abuse Resources Organization         Address  Phone  Notes  Alcohol and Drug Services  [phone]   [place]  [phone]   [facility] [place]   Chinita Pester  [phone]   Residential & Outpatient Substance Abuse Program  [phone]   Psychological Services Organization         Address  Phone  Notes  [facility] [place]  [place]  [phone]   [place] [address], [place] [phone] or  [phone]    Mobile Crisis Teams Organization         Address  Phone  Notes  [facility]atives, Mobile Crisis Care Unit  [phone]   Assertive Psychotherapeutic Services  [address] [place], [place]   [place] Levels [address] [place] [phone]    Self-Help/Support Groups Organization         Address  Phone             Notes  Mental  Health Assoc[facility] of [place] - variety of support groups  [place] Call for more information  Narcotics Anonymous (NA), Caring Services [address], [employer] [place]  2 meetings at this location   Special educational needs teacher         Address  Phone  Notes  ASAP Residential Treatment [place],    [place]  [phone]   [facility]  [address] [place] 748270, [place], [place]   [place], [place] [phone] Admissions: 8am-3pm M-F  Incentives Substance [place] [address],    [place], [place] [phone]   [facility] [address] [place], [place], [place]   [facility] [place] [facility]ually Violent Pred[facility] Program [address]  [place], [place]   Insight Programs - Intensive Outpatient [address], [place], [place]   [facility] [place].,  [place], [place] [phone] or [phone]   Residential Treatment Services (RTS) [address] [place], [place] Accepts Medicaid  [facility] [place] [address]  [place] [phone] Substance Abuse/Addiction Treatment   [facility] Organization         Address  Phone  Notes  [facility]  [phone]   Domenic Schwab, PhD [address] [place], [place]   [phone] or [phone]   [facility], [place] [phone]   [facility] [address]  [place], [place] [phone] Insurance/Medicaid/sponsorship through [facility] and Families [address] [place] [address]                                    [place], [place] [phone] [place] [address], [place] [phone]    Dr. Adele Schilder  [phone]   [facility] Dept. 1) [address], [place] 2) [place] 3)  [place] [address], [place] [phone]  [phone]   [place] [phone] or [phone] (After Hours)

## 2015-08-05 NOTE — ED Notes (Signed)
Onset two days ago of symptoms, thought to be a normal toothache.  Now pain is more severe and reports a knot in the back of mouth on th left.   Patient out of blood pressure medicine for 2 weeks

## 2015-08-05 NOTE — ED Provider Notes (Signed)
CSN: [id number]     Arrival date & time 08/05/15  0747 History   First MD Initiated Contact with Patient 08/05/15 [phone]     Chief Complaint  Patient presents with  . Dental Problem     (Consider location/radiation/quality/duration/timing/severity/associated sxs/prior Treatment) The history is provided by the patient and medical records.    This is a 34 y.o. F with hx of HTN, ovarian cysts, kidney stones, presenting to the ED for dental pain.  Patient states pain began 2 days ago and has been progressively worsening since this time. She states pain is throbbing in nature, worse at night when trying to sleep. She states she has developed some swelling signing her left lower molar is concern for infection. She denies any fever or chills. No difficulty swallowing. She has been eating soft diet to alleviate pain with chewing. No facial swelling. Patient's blood pressure is also significantly elevated. She has been out of her blood pressure medicines for the past 2 weeks. She has not called her primary care physician about this. She denies any chest pain, shortness of breath, headache, dizziness, numbness, or weakness.  Patient is established with a dentist, Dr. Excell Seltzer, however she states she would like to see another dentist if possible.  Past Medical History  Diagnosis Date  . Hypertension   . Ovarian cyst   . History of kidney stones 08/2014   Past Surgical History  Procedure Laterality Date  . Cesarean section  2000  . Cystoscopy with retrograde pyelogram, ureteroscopy and stent placement Left 09/05/2014    Procedure: CYSTOSCOPY WITH RETROGRADE PYELOGRAM, URETEROSCOPY, STONE BASKETRY AND STENT PLACEMENT;  Surgeon: Anner Crete, MD;  Location: [facility];  Service: Urology;  Laterality: Left;  . Holmium laser application Left 09/05/2014    Procedure: HOLMIUM LASER APPLICATION;  Surgeon: Anner Crete, MD;  Location: [facility];  Service: Urology;  Laterality: Left;  digital ureteroscope   Family  History  Problem Relation Age of Onset  . Kidney disease Father   . Diabetes Father   . Hypertension Maternal Aunt   . Diabetes Maternal Aunt   . Hypertension Maternal Grandmother   . Anesthesia problems Neg Hx   . Hypotension Neg Hx   . Malignant hyperthermia Neg Hx   . Pseudochol deficiency Neg Hx    Social History  Substance Use Topics  . Smoking status: Current Every Day Smoker -- 0.10 packs/day for 12 years    Types: Cigarettes  . Smokeless tobacco: Never Used  . Alcohol Use: No   OB History    Gravida Para Term Preterm AB TAB SAB Ectopic Multiple Living   0 0 0 0 0 2     Review of Systems  HENT: Positive for dental problem.   All other systems reviewed and are negative.     Allergies  Penicillins  Home Medications   Prior to Admission medications   Medication Sig Start Date End Date Taking? Authorizing Provider  amLODipine (NORVASC) 5 MG tablet Take 1 tablet (5 mg total) by mouth daily. 09/21/12   Josalyn Funches, MD  enalapril (VASOTEC) 10 MG tablet Take 1 tablet (10 mg total) by mouth daily. 05/19/12 09/05/14  Glori Luis, MD  hydrochlorothiazide (HYDRODIURIL) 25 MG tablet TAKE 2 TABLETS BY MOUTH DAILY 11/27/12   Dessa Phi, MD  ibuprofen (ADVIL,MOTRIN) 800 MG tablet Take 800 mg by mouth every 8 (eight) hours as needed for moderate pain.     Historical Provider, MD  medroxyPROGESTERone (  DEPO-PROVERA) 150 MG/ML injection Inject 1 mL (150 mg total) into the muscle every 3 (three) months. 09/02/12   Josalyn Funches, MD  potassium chloride SA (K-DUR,KLOR-CON) 20 MEQ tablet Take 1 tablet (20 mEq total) by mouth daily. 06/23/15   Uvaldo RisingKyle J Fletke, MD   BP 199/122 mmHg  Pulse 87  Temp(Src) 98.3 F (36.8 C) (Oral)  Resp 18  Ht 5\' 5"  (1.651 m)  Wt 185 lb (83.915 kg)  BMI 30.79 kg/m2  SpO2 100%   Physical Exam  Constitutional: She is oriented to person, place, and time. She appears well-developed and well-nourished. No distress.  HENT:  Head:  Normocephalic and atraumatic.  Mouth/Throat: Oropharynx is clear and moist.  Teeth largely in poor dentition,  left lower molar with obvious cavity noted, surrounding gingiva swollen and discolored along medial aspect consistent with developing abscess, no appreciable or drainable fluid collection at this time, handling secretions appropriately, no trismus; no facial or neck swelling, normal phonation, no stridor  Eyes: Conjunctivae and EOM are normal. Pupils are equal, round, and reactive to light.  Neck: Normal range of motion. Neck supple.  Cardiovascular: Normal rate, regular rhythm and normal heart sounds.   Pulmonary/Chest: Effort normal and breath sounds normal. No respiratory distress. She has no wheezes.  Musculoskeletal: Normal range of motion.  Neurological: She is alert and oriented to person, place, and time.  Skin: Skin is warm and dry. She is not diaphoretic.  Psychiatric: She has a normal mood and affect.  Nursing note and vitals reviewed.   ED Course  Procedures (including critical care time) Labs Review Labs Reviewed - No data to display  Imaging Review No results found.    EKG Interpretation None      MDM   Final diagnoses:  Dental abscess  Essential hypertension   34 year old female here with left lower dental pain. On exam she appears to have a dental abscess. She has no facial or neck swelling to suggest Ludwig's angina. She is afebrile, nontoxic in appearance. Handling all secretions well. Her blood pressure is significantly elevated which is likely due to she has been out of her meds for the past 2 weeks. She has no chest pain, shortness of breath, headache, dizziness, numbness, or weakness. I do not suspect any end organ damage. Will start antibiotics and refill her blood pressure medications. Patient will be referred to dentist on call for follow-up.  Discussed plan with patient, he/she acknowledged understanding and agreed with plan of care.  Return  precautions given for new or worsening symptoms.  Garlon HatchetLisa M Ashonti Leandro, PA-C 08/05/15 [phone]  Margarita Grizzleanielle Ray, MD 08/05/15 1017

## 2015-09-25 ENCOUNTER — Ambulatory Visit: Payer: Medicaid Other

## 2015-12-07 ENCOUNTER — Ambulatory Visit: Payer: BLUE CROSS/BLUE SHIELD | Admitting: Family Medicine

## 2016-03-21 ENCOUNTER — Ambulatory Visit (INDEPENDENT_AMBULATORY_CARE_PROVIDER_SITE_OTHER): Payer: BLUE CROSS/BLUE SHIELD | Admitting: Family Medicine

## 2016-03-21 ENCOUNTER — Encounter: Payer: Self-pay | Admitting: Family Medicine

## 2016-03-21 VITALS — BP 150/90 | HR 83 | Temp 98.5°F | Ht 65.0 in | Wt 186.2 lb

## 2016-03-21 DIAGNOSIS — I10 Essential (primary) hypertension: Secondary | ICD-10-CM

## 2016-03-21 DIAGNOSIS — Z124 Encounter for screening for malignant neoplasm of cervix: Secondary | ICD-10-CM | POA: Insufficient documentation

## 2016-03-21 DIAGNOSIS — F17200 Nicotine dependence, unspecified, uncomplicated: Secondary | ICD-10-CM

## 2016-03-21 DIAGNOSIS — Z309 Encounter for contraceptive management, unspecified: Secondary | ICD-10-CM | POA: Diagnosis not present

## 2016-03-21 DIAGNOSIS — Z3009 Encounter for other general counseling and advice on contraception: Secondary | ICD-10-CM

## 2016-03-21 LAB — POCT URINE PREGNANCY: PREG TEST UR: NEGATIVE

## 2016-03-21 MED ORDER — MEDROXYPROGESTERONE ACETATE 150 MG/ML IM SUSP
150.0000 mg | Freq: Once | INTRAMUSCULAR | Status: AC
  Administered 2016-03-21: 150 mg via INTRAMUSCULAR

## 2016-03-21 MED ORDER — ENALAPRIL MALEATE 10 MG PO TABS: 10.0000 mg | ORAL_TABLET | Freq: Every day | ORAL | Status: DC

## 2016-03-21 MED ORDER — AMLODIPINE BESYLATE 5 MG PO TABS: 5.0000 mg | ORAL_TABLET | Freq: Every day | ORAL | Status: DC

## 2016-03-21 MED ORDER — HYDROCHLOROTHIAZIDE 25 MG PO TABS: 50.0000 mg | ORAL_TABLET | Freq: Every day | ORAL | Status: DC

## 2016-03-21 NOTE — Assessment & Plan Note (Signed)
Uncontrolled. Suspect medication noncompliance. -refills provided -close clinical follow up -check BMP today

## 2016-03-21 NOTE — Patient Instructions (Signed)
It was nice to see you today.   Blood Pressure - elevated today, continue HCTZ/Lisinopril/Amlodipine, please come back for a nurse visit in 1-2 weeks for a nurse visit for a blood pressure check  Birth Control - we will give you Depo shot today.

## 2016-03-21 NOTE — Assessment & Plan Note (Signed)
Patient interested in DEPO. Urine pregnancy test negative. -DEPO given in office today -return in 3 months

## 2016-03-21 NOTE — Progress Notes (Signed)
   Subjective:    Patient ID: Marie Lawrence, female    DOB: 03/07/1981, 35 y.o.   MRN: [id number]  HPI 35 y/o female presents for discussion on birth control.  HTN - did not take meds this AM, checks BP rarely (elevated at work), no chest pain, occasional headaches (back of head, on days without medicine), no current headaches, no current vision change (had one episode a few months ago when she had vision blurry for a few seconds).  Was not taking Norvasc/Enalapril/HCTZ for a few months, recently restarted at last visit (last refills were in October 2016)  Birth control - previously on DEPO, unsure of last period (due to DEPO) - was prior to birth of child 4 years ago, currently smoking, no history of blood clots.   Tobacco - 1/2 pack per day, no thoughts of quitting  Social - Social research officer, governmentstore manager at [employer]   Review of Systems  Constitutional: Negative for fever, chills and fatigue.  Respiratory: Negative for cough and shortness of breath.   Cardiovascular: Negative for chest pain.       Objective:   Physical Exam BP 150/90 mmHg  Pulse 83  Temp(Src) 98.5 F (36.9 C) (Oral)  Ht 5\' 5"  (1.651 m)  Wt 186 lb 3.2 oz (84.46 kg)  BMI 30.99 kg/m2 Gen: pleasant female, NAD HEENT: normocephalic, PERRL, EOMI, MMM Cardiac: RRR, S1 and S2 present, no murmur Resp: CTAB, normal effort  Urine pregnancy negative today     Assessment & Plan:  Essential hypertension Uncontrolled. Suspect medication noncompliance. -refills provided -close clinical follow up -check BMP today  TOBACCO DEPENDENCE Still smoking 1/2 PPD. Not interested in quitting at this time. -monitor at subsequent visits.   Birth control counseling Patient interested in DEPO. Urine pregnancy test negative. -DEPO given in office today -return in 3 months

## 2016-03-21 NOTE — Assessment & Plan Note (Signed)
Still smoking 1/2 PPD. Not interested in quitting at this time. -monitor at subsequent visits.

## 2016-03-22 ENCOUNTER — Encounter: Payer: Self-pay | Admitting: Family Medicine

## 2016-03-22 LAB — BASIC METABOLIC PANEL WITH GFR
BUN: 8 mg/dL (ref 7–25)
CHLORIDE: 106 mmol/L (ref 98–110)
CO2: 23 mmol/L (ref 20–31)
Calcium: 8.6 mg/dL (ref 8.6–10.2)
Creat: 0.75 mg/dL (ref 0.50–1.10)
GFR, Est African American: >89 mL/min (ref >=60)
GLUCOSE: 87 mg/dL (ref 65–99)
Potassium: 3.6 mmol/L (ref 3.5–5.3)
SODIUM: 139 mmol/L (ref 135–146)

## 2016-07-10 ENCOUNTER — Ambulatory Visit (INDEPENDENT_AMBULATORY_CARE_PROVIDER_SITE_OTHER): Payer: BLUE CROSS/BLUE SHIELD | Admitting: *Deleted

## 2016-07-10 DIAGNOSIS — Z3042 Encounter for surveillance of injectable contraceptive: Secondary | ICD-10-CM

## 2016-07-10 LAB — POCT URINE PREGNANCY: Preg Test, Ur: NEGATIVE

## 2016-07-10 MED ORDER — MEDROXYPROGESTERONE ACETATE 150 MG/ML IM SUSP
150.0000 mg | Freq: Once | INTRAMUSCULAR | Status: AC
  Administered 2016-07-10: 150 mg via INTRAMUSCULAR

## 2016-07-10 NOTE — Progress Notes (Signed)
   Pt late for Depo Provera injection.  Pregnancy test ordered; results negative. Pt tolerated Depo injection. Depo given right upper outer quadrant.  Next injection due Dec. 6-20, 2017.  Reminder card given. Clovis PuMartin, Towanna Avery L, RN

## 2016-08-21 ENCOUNTER — Encounter: Payer: Self-pay | Admitting: Family Medicine

## 2016-08-21 NOTE — Progress Notes (Deleted)
35 y.o. year old female presents for well woman/preventative visit and annual GYN examination.  Acute Concerns: HTN - prescribed Norvasc 5 mg, Enalapril 10 mg, and HCTZ 25 mg daily  Diet:  Exercise:  Sexual/Birth History:  Birth Control: Depo (last injection 07/10/16)  POA/Living Will:  Social:  Social History   Social History  . Marital status: Single    Spouse name: N/A  . Number of children: N/A  . Years of education: N/A   Social History Main Topics  . Smoking status: Current Every Day Smoker    Packs/day: 0.10    Years: 12.00    Types: Cigarettes  . Smokeless tobacco: Never Used  . Alcohol use No  . Drug use: No  . Sexual activity: Yes   Other Topics Concern  . Not on file   Social History Narrative  . No narrative on file    Immunization: Immunization History  Administered Date(s) Administered  . Influenza Split 09/21/2012  . Td 07/14/2008  . Tdap 01/06/2012  Due for flu vaccine.  Cancer Screening:  Pap Smear: 06/20/15 (Negative, HPV negative)  Mammogram: Not a candidate  Colonoscopy: Not a candidate  Dexa:Not a candidate    Physical Exam: VITALS: Reviewed GEN: Pleasant female, NAD HEENT: Normocephalic, PERRL, EOMI, no scleral icterus, bilateral TM pearly grey, nasal septum midline, MMM, uvula midline, no anterior or posterior lymphadenopathy, no thyromegaly CARDIAC:RRR, S1 and S2 present, no murmur, no heaves/thrills RESP: CTAB, normal effort BREAST:Exam performed in the presence of a chaperone. No masses. No nipple discharge. No axillary lymphadenopathy. ABD: soft, no tenderness, normal bowel sounds GU/GYN:Exam performed in the presence of a chaperone. Normal external genitalia. Cervix unremarkable. Bimanual exam identified no masses EXT: No edema, 2+ radial and DP pulses SKIN: no rash  ASSESSMENT & PLAN: 35 y.o. female presents for annual well woman/preventative exam and GYN exam. Please see problem specific assessment and plan.   No  problem-specific Assessment & Plan notes found for this encounter.

## 2016-08-22 ENCOUNTER — Ambulatory Visit (INDEPENDENT_AMBULATORY_CARE_PROVIDER_SITE_OTHER): Payer: BLUE CROSS/BLUE SHIELD | Admitting: Family Medicine

## 2016-08-22 DIAGNOSIS — I10 Essential (primary) hypertension: Secondary | ICD-10-CM

## 2016-08-22 NOTE — Progress Notes (Signed)
Nope opened in error. Patient did not show to appointment.

## 2016-10-01 ENCOUNTER — Ambulatory Visit (INDEPENDENT_AMBULATORY_CARE_PROVIDER_SITE_OTHER): Payer: BLUE CROSS/BLUE SHIELD | Admitting: *Deleted

## 2016-10-01 DIAGNOSIS — Z3040 Encounter for surveillance of contraceptives, unspecified: Secondary | ICD-10-CM | POA: Diagnosis not present

## 2016-10-01 DIAGNOSIS — Z3042 Encounter for surveillance of injectable contraceptive: Secondary | ICD-10-CM

## 2016-10-01 MED ORDER — MEDROXYPROGESTERONE ACETATE 150 MG/ML IM SUSP
150.0000 mg | Freq: Once | INTRAMUSCULAR | Status: AC
  Administered 2016-10-01: 150 mg via INTRAMUSCULAR

## 2016-10-01 NOTE — Progress Notes (Signed)
  Pt in for Depo Provera injection.  Pt tolerated Depo injection. Depo given left upper outer quadrant.  Next injection due Feb. 27-December 31, 2016.  Reminder card given. Clovis PuMartin, Sarahbeth Cashin L, RN

## 2016-10-25 ENCOUNTER — Ambulatory Visit (INDEPENDENT_AMBULATORY_CARE_PROVIDER_SITE_OTHER): Payer: BLUE CROSS/BLUE SHIELD | Admitting: Family Medicine

## 2016-10-25 ENCOUNTER — Encounter: Payer: Self-pay | Admitting: Family Medicine

## 2016-10-25 DIAGNOSIS — E6609 Other obesity due to excess calories: Secondary | ICD-10-CM | POA: Diagnosis not present

## 2016-10-25 DIAGNOSIS — I10 Essential (primary) hypertension: Secondary | ICD-10-CM | POA: Diagnosis not present

## 2016-10-25 DIAGNOSIS — E669 Obesity, unspecified: Secondary | ICD-10-CM | POA: Insufficient documentation

## 2016-10-25 DIAGNOSIS — R059 Cough, unspecified: Secondary | ICD-10-CM | POA: Insufficient documentation

## 2016-10-25 DIAGNOSIS — Z6832 Body mass index (BMI) 32.0-32.9, adult: Secondary | ICD-10-CM

## 2016-10-25 DIAGNOSIS — R05 Cough: Secondary | ICD-10-CM

## 2016-10-25 MED ORDER — HYDROCHLOROTHIAZIDE 25 MG PO TABS: 25.0000 mg | ORAL_TABLET | Freq: Every day | ORAL | 2 refills | Status: DC

## 2016-10-25 MED ORDER — AMLODIPINE BESYLATE 10 MG PO TABS: 10.0000 mg | ORAL_TABLET | Freq: Every day | ORAL | 2 refills | Status: DC

## 2016-10-25 MED ORDER — CETIRIZINE HCL 10 MG PO TABS: 10.0000 mg | ORAL_TABLET | Freq: Every day | ORAL | 2 refills | Status: DC

## 2016-10-25 NOTE — Assessment & Plan Note (Signed)
Discussed dietary changes including decreased fast food, multiple smaller meals instead of 1-2 large meals. Encouraged regular activity/exercise.

## 2016-10-25 NOTE — Assessment & Plan Note (Signed)
Chronic ough associated with multiple viral illness. -trial of Zyrtec

## 2016-10-25 NOTE — Patient Instructions (Addendum)
It was nice to see you today.  Please restart your blood pressure medications.  Start Zyrtec 10 mg daily for runny nose/cough.  Please start to take meals with you to work. Eat at least 3 small meals throughout the day instead of 1-2 large meals. Start to eat a small breakfast each morning before work.  Please return in 2 weeks for blood pressure check and we can take a closer look at your knee.

## 2016-10-25 NOTE — Assessment & Plan Note (Signed)
Uncontrolled due to medication noncompliance. -stopped Enalapril as she is of childbearing age. -increase Norvasc to 10 mg daily (prescription provided) -prescription for HCTZ 25 mg provided -return in 2 weeks for BP check

## 2016-10-25 NOTE — Progress Notes (Signed)
   Subjective:    Patient ID: Marie Lawrence, female    DOB: 03/15/1981, 36 y.o.   MRN: [id number]  HPI 36 y/o female presents for routine follow up.  Hypertension Patient has not been taking her blood pressure meds. States she ran out a few weeks ago and has not remembered to ask for refills. Reports checking her BP at home occasionally with readings in the 170s/90s.  Is motivated to restart her BP meds and will receive refills today. No chest pain or changes to vision.   Obesity Patient works at [employer] and does not eat regularly. She usually does not have breakfast and when she does it is usually comprised of sweet tea/water.  Lunch: Grilled chicken sandwich, no fruits, occasional salad Dinner: Fast food ([employer] motivation to make changes to her diet by bringing food from home to work.   Exercise: Does not exercise. States that she is too tired when she gets home after work.   Cough/Congestion Reports runny nose, congestion and nonproductive cough for several weeks now.  Denies fever, chills, chest pain, dyspnea, hemoptysis.   Sexual/Birth history: unprotected with one partner, currently on Depo   Review of Systems  Constitutional: Negative for chills and fever.  HENT: Positive for congestion, postnasal drip and rhinorrhea. Negative for ear pain, sinus pain, sore throat and trouble swallowing.   Eyes: Negative for pain and discharge.  Respiratory: Positive for cough. Negative for chest tightness, shortness of breath and wheezing.   Cardiovascular: Negative for chest pain.  Gastrointestinal: Negative for abdominal pain, constipation, diarrhea, nausea and vomiting.  Genitourinary: Negative for hematuria.  Skin: Negative for rash.  Neurological: Negative for dizziness.       Objective:   Physical Exam BP (!) 172/98   Pulse 82   Temp 98.3 F (36.8 C) (Oral)   Ht 5\' 5"  (1.651 m)   Wt 194 lb (88 kg)   BMI 32.28 kg/m   Gen: Pleasant female. Comfortable.  NAD Neuro: Alert and interacting appropriately. CN II-XII intact.  HEENT: Normocephalic. No extraoral swellings. No LAD. Trachea midline. No thyromegaly. PERRLA. EOMI. TM grey without erythema, effusion or exudate. No bulging or retraction. Oropharynx clear with no erythema or edema. Uvula midline.  CV: Regular rate and rhythm. No murmurs or rubs. S1 and S2 present. Pulm: Lungs CTAB. No rhonchi, wheezing, rales.  Abd: Soft and non-tender.  Ext: WWP. Strength 5/5 bilaterally.       Assessment & Plan:  Essential hypertension Uncontrolled due to medication noncompliance. -stopped Enalapril as she is of childbearing age. -increase Norvasc to 10 mg daily (prescription provided) -prescription for HCTZ 25 mg provided -return in 2 weeks for BP check  Obesity Discussed dietary changes including decreased fast food, multiple smaller meals instead of 1-2 large meals. Encouraged regular activity/exercise.   Cough Chronic ough associated with multiple viral illness. -trial of Zyrtec

## 2017-07-03 ENCOUNTER — Other Ambulatory Visit: Payer: Self-pay | Admitting: *Deleted

## 2017-07-03 MED ORDER — HYDROCHLOROTHIAZIDE 25 MG PO TABS: 25.0000 mg | ORAL_TABLET | Freq: Every day | ORAL | 2 refills | Status: DC

## 2017-07-03 MED ORDER — AMLODIPINE BESYLATE 10 MG PO TABS: 10.0000 mg | ORAL_TABLET | Freq: Every day | ORAL | 2 refills | Status: DC

## 2017-09-01 ENCOUNTER — Encounter: Payer: Self-pay | Admitting: Family Medicine

## 2017-09-01 ENCOUNTER — Ambulatory Visit: Payer: BLUE CROSS/BLUE SHIELD | Admitting: Family Medicine

## 2017-09-01 VITALS — BP 118/80 | HR 80 | Temp 98.3°F | Ht 65.0 in | Wt 196.4 lb

## 2017-09-01 DIAGNOSIS — Z308 Encounter for other contraceptive management: Secondary | ICD-10-CM

## 2017-09-01 DIAGNOSIS — Z3009 Encounter for other general counseling and advice on contraception: Secondary | ICD-10-CM | POA: Diagnosis not present

## 2017-09-01 DIAGNOSIS — M545 Low back pain: Secondary | ICD-10-CM

## 2017-09-01 DIAGNOSIS — Z3200 Encounter for pregnancy test, result unknown: Secondary | ICD-10-CM | POA: Diagnosis not present

## 2017-09-01 LAB — POCT URINE PREGNANCY: Preg Test, Ur: NEGATIVE

## 2017-09-01 NOTE — Progress Notes (Signed)
Subjective:    Marie Lawrence is a 36 y.o. female who [facility] today for contraception:  1.  Contraception: Here for Depakote shot.  She has been on this for years.  She has not had a regular menses due to being on Depo-Provera.  She has had no problems with this.  Her last shot was actually in 2017.  She has had inconsistent yet protected intercourse since then.  Like to restart Depo more regularly now.  No abdominal pain.  2.  Lower back pain: Chronic for weeks to months.  Worse with standing.  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  She has had the same shoes that these are sneakers for the past several months if not year.  No bladder or bowel incontinence.  No radiation.  Describes as dull aching pain in lower back.   ROS as above per HPI.  Pertinently, no chest pain, palpitations, SOB, Fever, Chills, Abd pain, N/V/D.   The following portions of the patient's history were reviewed and updated as appropriate: allergies, current medications, past medical history, family and social history, and problem list. Patient is a nonsmoker.    PMH reviewed.  Past Medical History:  Diagnosis Date  . History of kidney stones 08/2014  . Hypertension   . Kidney stone on left side 09/07/2014   09/05/14 - 9 by 13 mm left ureteral stone, multiple bilateral smaller stones   . Ovarian cyst    Past Surgical History:  Procedure Laterality Date  . CESAREAN SECTION  2000    Medications reviewed. Current Outpatient Medications  Medication Sig Dispense Refill  . amLODipine (NORVASC) 10 MG tablet Take 1 tablet (10 mg total) by mouth daily. 30 tablet 2  . cetirizine (ZYRTEC) 10 MG tablet Take 1 tablet (10 mg total) by mouth daily. 30 tablet 2  . hydrochlorothiazide (HYDRODIURIL) 25 MG tablet Take 1 tablet (25 mg total) by mouth daily. 30 tablet 2  . ibuprofen (ADVIL,MOTRIN) 800 MG tablet Take 1 tablet (800 mg total) by mouth 3 (three) times daily. 21  tablet 0  . medroxyPROGESTERone (DEPO-PROVERA) 150 MG/ML injection Inject 1 mL (150 mg total) into the muscle every 3 (three) months. 1 mL   . potassium chloride SA (K-DUR,KLOR-CON) 20 MEQ tablet Take 1 tablet (20 mEq total) by mouth daily. 3 tablet 0   No current facility-administered medications for this visit.      Objective:   Physical Exam BP 118/80   Pulse 80   Temp 98.3 F (36.8 C) (Oral)   Ht 5\' 5"  (1.651 m)   Wt 196 lb 6.4 oz (89.1 kg)   SpO2 99%   BMI 32.68 kg/m  Gen:  Alert, cooperative patient who appears stated age in no acute distress.  Vital signs reviewed. HEENT: EOMI,  MMM Cardiac:  Regular rate and rhythm without murmur auscultated.  Good S1/S2. Pulm:  Clear to auscultation bilaterally with good air movement.  No wheezes or rales noted.   Back:  Normal skin, Spine with normal alignment and no deformity.  No tenderness to vertebral process palpation.  Paraspinous muscles are not tender and without spasm.   Range of motion is full at neck and decreased forward flexion lumbar sacral regions due to pain, though mild.  Straight leg raise is negative Neuro:  Sensation and motor function 5/5 bilateral lower extremities.       Results for orders placed or performed in visit  on 09/01/17 (from the past 72 hour(s))  POCT urine pregnancy     Status: None   Collection Time: 09/01/17  3:58 PM  Result Value Ref Range   Preg Test, Ur Negative Negative

## 2017-09-01 NOTE — Patient Instructions (Signed)
It was good to meet you today.  We did your Depo-provera shot today.  Go by the imaging center for a back x-ray sometime this week.  I will call you with the results once they send them to me.

## 2017-09-02 MED ORDER — MEDROXYPROGESTERONE ACETATE 150 MG/ML IM SUSY
150.0000 mg | PREFILLED_SYRINGE | Freq: Once | INTRAMUSCULAR | Status: AC
  Administered 2017-09-01: 150 mg via INTRAMUSCULAR

## 2017-09-03 DIAGNOSIS — M545 Low back pain, unspecified: Secondary | ICD-10-CM | POA: Insufficient documentation

## 2017-09-03 NOTE — Assessment & Plan Note (Signed)
No red flags on exam.  Conservative measures to start.  I did give her information on commercial foot inserts.  See after visit summary for more details.  Over-the-counter analgesics as needed for pain.  As this is been going on for months we will obtain lumbar x-ray films to ensure nothing else worse is going on but I do not suspect that

## 2017-09-03 NOTE — Assessment & Plan Note (Signed)
Upreg negative.   Restarting depo today.  RTC 3 months.

## 2017-10-04 ENCOUNTER — Encounter (HOSPITAL_COMMUNITY): Payer: Self-pay | Admitting: Emergency Medicine

## 2017-10-04 ENCOUNTER — Ambulatory Visit (HOSPITAL_COMMUNITY)
Admission: EM | Admit: 2017-10-04 | Discharge: 2017-10-04 | Disposition: A | Discharge status: Home / Self Care | Payer: BLUE CROSS/BLUE SHIELD | Attending: Family Medicine | Admitting: Family Medicine

## 2017-10-04 DIAGNOSIS — R319 Hematuria, unspecified: Secondary | ICD-10-CM | POA: Diagnosis not present

## 2017-10-04 DIAGNOSIS — R109 Unspecified abdominal pain: Secondary | ICD-10-CM

## 2017-10-04 LAB — POCT URINALYSIS DIP (DEVICE)
Bilirubin Urine: NEGATIVE
GLUCOSE, UA: NEGATIVE mg/dL
NITRITE: NEGATIVE
PROTEIN: 30 mg/dL — AB
Specific Gravity, Urine: 1.02 (ref 1.005–1.030)
UROBILINOGEN UA: 1 mg/dL (ref 0.0–1.0)
pH: 7 (ref 5.0–8.0)

## 2017-10-04 MED ORDER — HYDROCODONE-ACETAMINOPHEN 5-325 MG PO TABS: 1.0000 | ORAL_TABLET | Freq: Four times a day (QID) | ORAL | 0 refills | Status: DC | PRN

## 2017-10-04 MED ORDER — ONDANSETRON 4 MG PO TBDP: 4.0000 mg | ORAL_TABLET | Freq: Three times a day (TID) | ORAL | 0 refills | Status: DC | PRN

## 2017-10-04 MED ORDER — IBUPROFEN 800 MG PO TABS: 800.0000 mg | ORAL_TABLET | Freq: Three times a day (TID) | ORAL | 0 refills | Status: DC

## 2017-10-04 NOTE — ED Provider Notes (Signed)
[facility]   [id number] 10/04/17 Arrival Time: 1450  ASSESSMENT & PLAN:  1. Flank pain, acute   2. Hematuria, unspecified type    Suspect nephrolithiasis.  Meds ordered this encounter  Medications  . ibuprofen (ADVIL,MOTRIN) 800 MG tablet    Sig: Take 1 tablet (800 mg total) by mouth 3 (three) times daily.    Dispense:  21 tablet    Refill:  0  . HYDROcodone-acetaminophen (NORCO/VICODIN) 5-325 MG tablet    Sig: Take 1 tablet by mouth every 6 (six) hours as needed for moderate pain or severe pain.    Dispense:  8 tablet    Refill:  0  . ondansetron (ZOFRAN-ODT) 4 MG disintegrating tablet    Sig: Take 1 tablet (4 mg total) by mouth every 8 (eight) hours as needed for nausea or vomiting.    Dispense:  15 tablet    Refill:  0   Will discharge patient with a short course of opiates. Discussed risks. Advised patient not to mix with other products containing acetaminophen, not to combine with alcohol or other illicit drugs, not to drive or operate machinery, and to refrain from any activity that will require complete attention while taking this medication.   She will call her urologist Monday morning to schedule f/u. If any worsening, she will proceed to the Emergency Department for evaluation.  Reviewed expectations re: course of current medical issues. Questions answered. Outlined signs and symptoms indicating need for more acute intervention. Patient verbalized understanding. After Visit Summary given.   SUBJECTIVE:  Marie Lawrence is a 36 y.o. female who presents with complaint of flank discomfort. Onset abrupt, 1 day ago. Described as colicky and sharp. Location: CVA left with radiation to left groin. Described symptoms are stable since beginning. Aggravating factors: none. Alleviating factors: acetaminophen with slight, temporary help. Associated symptoms: none. The patient denies anorexia, chills, diarrhea, dysuria, fever, hematuria, nausea and vomiting. Appetite:  normal. PO intake: normal. Ambulatory without assistance. Urinary symptoms: none. Last bowel movement yesterday without blood. No LMP recorded. Patient has had an injection.  History of similar symptoms: Yes. Kidney stone a few years ago. Needed surgical removal secondary to size.  Past Surgical History:  Procedure Laterality Date  . CESAREAN SECTION  2000  . CYSTOSCOPY WITH RETROGRADE PYELOGRAM, URETEROSCOPY AND STENT PLACEMENT Left 09/05/2014   Procedure: CYSTOSCOPY WITH RETROGRADE PYELOGRAM, URETEROSCOPY, STONE BASKETRY AND STENT PLACEMENT;  Surgeon: Anner CreteJohn J Wrenn, MD;  Location: [facility];  Service: Urology;  Laterality: Left;  . HOLMIUM LASER APPLICATION Left 09/05/2014   Procedure: HOLMIUM LASER APPLICATION;  Surgeon: Anner CreteJohn J Wrenn, MD;  Location: [facility];  Service: Urology;  Laterality: Left;  digital ureteroscope   ROS: As per HPI. All other systems negative.  OBJECTIVE:  Vitals:   10/04/17 1506  BP: (!) 157/104  Pulse: 82  Resp: 18  Temp: 98.3 F (36.8 C)  TempSrc: Oral  SpO2: 99%    General appearance: alert; no distress Lungs: clear to auscultation bilaterally Heart: regular rate and rhythm Abdomen: soft; non-distended; no tenderness; bowel sounds present; no masses or organomegaly; no guarding or rebound tenderness Back: mild L CVA tenderness Extremities: no edema; symmetrical with no gross deformities Skin: warm and dry Neurologic: normal gait Psychological: alert and cooperative; normal mood and affect  Labs: Results for orders placed or performed during the hospital encounter of 10/04/17  POCT urinalysis dip (device)  Result Value Ref Range   Glucose, UA NEGATIVE NEGATIVE mg/dL   Bilirubin Urine NEGATIVE  NEGATIVE   Ketones, ur TRACE (A) NEGATIVE mg/dL   Specific Gravity, Urine 1.020 1.005 - 1.030   Hgb urine dipstick TRACE (A) NEGATIVE   pH 7.0 5.0 - 8.0   Protein, ur 30 (A) NEGATIVE mg/dL   Urobilinogen, UA 1.0 0.0 - 1.0 mg/dL   Nitrite NEGATIVE NEGATIVE    Leukocytes, UA MODERATE (A) NEGATIVE   Labs Reviewed  POCT URINALYSIS DIP (DEVICE) - Abnormal; Notable for the following components:      Result Value   Ketones, ur TRACE (*)    Hgb urine dipstick TRACE (*)    Protein, ur 30 (*)    Leukocytes, UA MODERATE (*)    All other components within normal limits     Allergies  Allergen Reactions  . Penicillins Itching                                               Past Medical History:  Diagnosis Date  . History of kidney stones 08/2014  . Hypertension   . Kidney stone on left side 09/07/2014   09/05/14 - 9 by 13 mm left ureteral stone, multiple bilateral smaller stones   . Ovarian cyst    Social History   Socioeconomic History  . Marital status: Single    Spouse name: Not on file  . Number of children: Not on file  . Years of education: Not on file  . Highest education level: Not on file  Social Needs  . Financial resource strain: Not on file  . Food insecurity - worry: Not on file  . Food insecurity - inability: Not on file  . Transportation needs - medical: Not on file  . Transportation needs - non-medical: Not on file  Occupational History  . Not on file  Tobacco Use  . Smoking status: Current Every Day Smoker    Packs/day: 0.10    Years: 12.00    Pack years: 1.20    Types: Cigarettes  . Smokeless tobacco: Never Used  Substance and Sexual Activity  . Alcohol use: No  . Drug use: No  . Sexual activity: Yes  Other Topics Concern  . Not on file  Social History Narrative  . Not on file   Family History  Problem Relation Age of Onset  . Kidney disease Father   . Diabetes Father   . Hypertension Maternal Aunt   . Diabetes Maternal Aunt   . Hypertension Maternal Grandmother   . Anesthesia problems Neg Hx   . Hypotension Neg Hx   . Malignant hyperthermia Neg Hx   . Pseudochol deficiency Neg Hx      Mardella LaymanHagler, Rickardo Brinegar, MD 10/04/17 [phone]

## 2017-10-04 NOTE — ED Triage Notes (Signed)
PT C/O: left lower back pain.... Hx of kidney stones... sts it feels similar  ONSET: last night  SX ALSO INCLUDE: pain increases w/activity   DENIES: f/v/n/d, inj/trauma, urinary sx  TAKING MEDS: acetaminophen  A&O x4... NAD... Ambulatory

## 2017-10-04 NOTE — Discharge Instructions (Signed)
Please return here or to the Emergency Department immediately should you feel worse in any way or have any of the following symptoms: increasing or different flank or abdominal pain, persistent vomiting, fevers, or shaking chills.  Be aware, pain medications may cause drowsiness. Please do not drive, operate heavy machinery or make important decisions while on this medication, it can cloud your judgement.

## 2018-03-10 ENCOUNTER — Ambulatory Visit (INDEPENDENT_AMBULATORY_CARE_PROVIDER_SITE_OTHER): Payer: BLUE CROSS/BLUE SHIELD | Admitting: Family Medicine

## 2018-03-10 ENCOUNTER — Other Ambulatory Visit: Payer: Self-pay

## 2018-03-10 ENCOUNTER — Encounter: Payer: Self-pay | Admitting: Family Medicine

## 2018-03-10 VITALS — BP 138/82 | HR 68 | Temp 99.0°F | Ht 65.0 in | Wt 190.2 lb

## 2018-03-10 DIAGNOSIS — Z0000 Encounter for general adult medical examination without abnormal findings: Secondary | ICD-10-CM | POA: Diagnosis not present

## 2018-03-10 DIAGNOSIS — Z3009 Encounter for other general counseling and advice on contraception: Secondary | ICD-10-CM

## 2018-03-10 DIAGNOSIS — Z3042 Encounter for surveillance of injectable contraceptive: Secondary | ICD-10-CM

## 2018-03-10 DIAGNOSIS — Z3202 Encounter for pregnancy test, result negative: Secondary | ICD-10-CM

## 2018-03-10 LAB — POCT URINE PREGNANCY: Preg Test, Ur: NEGATIVE

## 2018-03-10 MED ORDER — MEDROXYPROGESTERONE ACETATE 150 MG/ML IM SUSY
150.0000 mg | PREFILLED_SYRINGE | Freq: Once | INTRAMUSCULAR | Status: AC
  Administered 2018-03-10: 150 mg via INTRAMUSCULAR

## 2018-03-10 NOTE — Progress Notes (Signed)
Marie Lawrence is a 37 y.o. female who presents to [facility] today for comprehensive physical examination:  CPE  Concerns:  n/a  Last physical last year Tetanus up to date Eye exam:  N/a -- does mention blurriness with headlights at night time.  None during the day.     PMH reviewed. Patient is a nonsmoker.   Past Medical History:  Diagnosis Date  . History of kidney stones 08/2014  . Hypertension   . Kidney stone on left side 09/07/2014   09/05/14 - 9 by 13 mm left ureteral stone, multiple bilateral smaller stones   . Ovarian cyst    Past Surgical History:  Procedure Laterality Date  . CESAREAN SECTION  2000  . CYSTOSCOPY WITH RETROGRADE PYELOGRAM, URETEROSCOPY AND STENT PLACEMENT Left 09/05/2014   Procedure: CYSTOSCOPY WITH RETROGRADE PYELOGRAM, URETEROSCOPY, STONE BASKETRY AND STENT PLACEMENT;  Surgeon: Anner Crete, MD;  Location: [facility];  Service: Urology;  Laterality: Left;  . HOLMIUM LASER APPLICATION Left 09/05/2014   Procedure: HOLMIUM LASER APPLICATION;  Surgeon: Anner Crete, MD;  Location: [facility];  Service: Urology;  Laterality: Left;  digital ureteroscope    Medications reviewed. Current Outpatient Medications  Medication Sig Dispense Refill  . amLODipine (NORVASC) 10 MG tablet Take 1 tablet (10 mg total) by mouth daily. 30 tablet 2  . cetirizine (ZYRTEC) 10 MG tablet Take 1 tablet (10 mg total) by mouth daily. 30 tablet 2  . hydrochlorothiazide (HYDRODIURIL) 25 MG tablet Take 1 tablet (25 mg total) by mouth daily. 30 tablet 2  . HYDROcodone-acetaminophen (NORCO/VICODIN) 5-325 MG tablet Take 1 tablet by mouth every 6 (six) hours as needed for moderate pain or severe pain. 8 tablet 0  . ibuprofen (ADVIL,MOTRIN) 800 MG tablet Take 1 tablet (800 mg total) by mouth 3 (three) times daily. 21 tablet 0  . medroxyPROGESTERone (DEPO-PROVERA) 150 MG/ML injection Inject 1 mL (150 mg total) into the muscle every 3 (three) months. 1 mL   . ondansetron (ZOFRAN-ODT) 4 MG  disintegrating tablet Take 1 tablet (4 mg total) by mouth every 8 (eight) hours as needed for nausea or vomiting. 15 tablet 0  . potassium chloride SA (K-DUR,KLOR-CON) 20 MEQ tablet Take 1 tablet (20 mEq total) by mouth daily. 3 tablet 0   No current facility-administered medications for this visit.     Social: Smoking history:  denies Alcohol use:  occasional Illicit drug use:  denies Relationship status:   Lives at home with boyfriend and 4 year old son who may have ADHD Occupation:  Works at [employer]  Family History:   - HTN main issue - no immediate family history of Breast or colon cancer.   Review of Systems  Constitutional: Negative for fever.  HENT: Negative for congestion, ear discharge, ear pain and hearing loss.   Eyes: Positive for blurred vision at night.  Respiratory: Negative for cough and wheezing.   Cardiovascular: Negative for chest pain, palpitations and leg swelling.  Gastrointestinal: Negative for nausea, vomiting and abdominal pain.  Musculoskeletal: Occasional wrist pain   Skin: Negative for rash.  Neurological: Negative for dizziness and headaches.  Psychiatric/Behavioral: Negative for depression and suicidal ideas.   Exam: BP 138/82   Pulse 68   Temp 99 F (37.2 C) (Oral)   Ht  (1.651 m)   Wt 190 lb 3.2 oz (86.3 kg)   SpO2 99%   BMI 31.65 kg/m  Gen:  Alert, cooperative patient who appears stated age in no acute distress.  Vital  signs reviewed. Head: [place]/AT.   Eyes:  EOMI, PERRL.   Ears:  External ears WNL, Bilateral TM's normal without retraction, redness or bulging. Nose:  Septum midline  Mouth:  MMM, tonsils non-erythematous, non-edematous.   Neck: No masses or thyromegaly or limitation in range of motion.  No cervical lymphadenopathy. Pulm:  Clear to auscultation bilaterally with good air movement.  No wheezes or rales noted.   Cardiac:  Regular rate and rhythm without murmur auscultated.  Good S1/S2. Abd:  Soft/nondistended/nontender.   Good bowel sounds throughout all four quadrants.  No masses noted.  Ext:  No clubbing/cyanosis/erythema.  No edema noted bilateral lower extremities.   Neuro:  Grossly normal, no gait abnormalities Psych:  Not depressed or anxious appearing.  Conversant and engaged  Impression/Plan: 1. Complete Physical Examination: anticipatory guidance provided. Due for pap smear later this year.   2.  Vaccines:  Up to date.  - otherwise doing well.

## 2018-03-10 NOTE — Addendum Note (Signed)
Addended by: Lamonte Sakai, Adamari Frede D on: 03/10/2018 10:33 AM   Modules accepted: Orders

## 2018-03-10 NOTE — Patient Instructions (Signed)
It was good to see you again today.   Come back and see me in August for a pap smear.    Come back also if you have any questions.

## 2018-03-20 ENCOUNTER — Telehealth: Payer: Self-pay | Admitting: *Deleted

## 2018-03-20 MED ORDER — CETIRIZINE HCL 10 MG PO TABS: 10.0000 mg | ORAL_TABLET | Freq: Every day | ORAL | 6 refills | Status: DC

## 2018-03-20 NOTE — Telephone Encounter (Signed)
Refill request came in via fax for Cetirizine  tablets.  Pharmacy is [facility] [address].  Did not see on current med list. Lamonte Sakai, April D, [place]

## 2018-03-20 NOTE — Telephone Encounter (Signed)
Done

## 2018-04-28 ENCOUNTER — Other Ambulatory Visit: Payer: Self-pay | Admitting: Family Medicine

## 2018-06-23 ENCOUNTER — Other Ambulatory Visit (HOSPITAL_COMMUNITY)
Admission: RE | Admit: 2018-06-23 | Discharge: 2018-06-23 | Disposition: A | Discharge status: Home / Self Care | Payer: BLUE CROSS/BLUE SHIELD | Source: Ambulatory Visit | Attending: Family Medicine | Admitting: Family Medicine

## 2018-06-23 ENCOUNTER — Other Ambulatory Visit: Payer: Self-pay

## 2018-06-23 ENCOUNTER — Encounter: Payer: Self-pay | Admitting: Family Medicine

## 2018-06-23 ENCOUNTER — Ambulatory Visit (INDEPENDENT_AMBULATORY_CARE_PROVIDER_SITE_OTHER): Payer: BLUE CROSS/BLUE SHIELD | Admitting: Family Medicine

## 2018-06-23 VITALS — BP 126/86 | HR 77 | Temp 98.5°F | Ht 65.0 in | Wt 189.4 lb

## 2018-06-23 DIAGNOSIS — Z308 Encounter for other contraceptive management: Secondary | ICD-10-CM | POA: Diagnosis not present

## 2018-06-23 DIAGNOSIS — Z124 Encounter for screening for malignant neoplasm of cervix: Secondary | ICD-10-CM

## 2018-06-23 DIAGNOSIS — I10 Essential (primary) hypertension: Secondary | ICD-10-CM | POA: Diagnosis not present

## 2018-06-23 DIAGNOSIS — Z23 Encounter for immunization: Secondary | ICD-10-CM

## 2018-06-23 DIAGNOSIS — Z0000 Encounter for general adult medical examination without abnormal findings: Secondary | ICD-10-CM

## 2018-06-23 DIAGNOSIS — Z3009 Encounter for other general counseling and advice on contraception: Secondary | ICD-10-CM

## 2018-06-23 LAB — POCT URINE PREGNANCY: Preg Test, Ur: NEGATIVE

## 2018-06-23 MED ORDER — MEDROXYPROGESTERONE ACETATE 150 MG/ML IM SUSY
150.0000 mg | PREFILLED_SYRINGE | Freq: Once | INTRAMUSCULAR | Status: AC
  Administered 2018-06-23: 150 mg via INTRAMUSCULAR

## 2018-06-23 NOTE — Patient Instructions (Signed)
It was good to see you again today.  We will call you with the ultrasound for your breast.  This feels like a cyst to me.  I will also let you know the results of your pap smear when it comes back.

## 2018-06-24 ENCOUNTER — Encounter: Payer: Self-pay | Admitting: Family Medicine

## 2018-06-24 NOTE — Assessment & Plan Note (Signed)
Pap smear obtained today. 

## 2018-06-24 NOTE — Assessment & Plan Note (Signed)
At goal.  

## 2018-06-24 NOTE — Assessment & Plan Note (Signed)
Continue depo medrol.  No complaints or concerns with this.

## 2018-06-24 NOTE — Progress Notes (Signed)
Subjective:    Marie Lawrence is a 37 y.o. female who presents to [facility] today for pap smear:  1.  Pap smear:  Patient here in May ago for her annual exam.  Did not obtain pap smear that visit.  Here today for Pap.    2.  Contraception:  On Depo.  Wants to continue with this.  No breakthrough bleeding.  No vaginal discharge.  No unprotected sexual intercourse.  No dysuria.  Weight gain has been minimal.  Eating and drinking well.  \ ROS as above per HPI.    The following portions of the patient's history were reviewed and updated as appropriate: allergies, current medications, past medical history, family and social history, and problem list. Patient is a nonsmoker.    PMH reviewed.  Past Medical History:  Diagnosis Date  . History of kidney stones 08/2014  . Hypertension   . Kidney stone on left side 09/07/2014   09/05/14 - 9 by 13 mm left ureteral stone, multiple bilateral smaller stones   . Ovarian cyst    Past Surgical History:  Procedure Laterality Date  . CESAREAN SECTION  2000  . CYSTOSCOPY WITH RETROGRADE PYELOGRAM, URETEROSCOPY AND STENT PLACEMENT Left 09/05/2014   Procedure: CYSTOSCOPY WITH RETROGRADE PYELOGRAM, URETEROSCOPY, STONE BASKETRY AND STENT PLACEMENT;  Surgeon: Anner Crete, MD;  Location: [facility];  Service: Urology;  Laterality: Left;  . HOLMIUM LASER APPLICATION Left 09/05/2014   Procedure: HOLMIUM LASER APPLICATION;  Surgeon: Anner Crete, MD;  Location: [facility];  Service: Urology;  Laterality: Left;  digital ureteroscope    Medications reviewed. Current Outpatient Medications  Medication Sig Dispense Refill  . amLODipine (NORVASC) 10 MG tablet TAKE 1 TABLET(10 MG) BY MOUTH DAILY 30 tablet 3  . cetirizine (ZYRTEC) 10 MG tablet Take 1 tablet (10 mg total) by mouth daily. 30 tablet 6  . hydrochlorothiazide (HYDRODIURIL) 25 MG tablet TAKE 1 TABLET(25 MG) BY MOUTH DAILY 30 tablet 3  . medroxyPROGESTERone (DEPO-PROVERA) 150 MG/ML injection Inject 1 mL (150 mg total)  into the muscle every 3 (three) months. 1 mL    No current facility-administered medications for this visit.      Objective:   Physical Exam BP 126/86   Pulse 77   Temp 98.5 F (36.9 C) (Oral)   Ht 5\' 5"  (1.651 m)   Wt 189 lb 6.4 oz (85.9 kg)   SpO2 99%   BMI 31.52 kg/m  Gen:  Alert, cooperative patient who appears stated age in no acute distress.  Vital signs reviewed. HEENT: EOMI,  MMM Cardiac:  Regular rate and rhythm without murmur auscultated.  Good S1/S2. Pulm:  Clear to auscultation bilaterally with good air movement.  No wheezes or rales noted.   GYN:  External genitalia within normal limits.  Vaginal mucosa pink, moist, normal rugae.  Nonfriable cervix without lesions, no discharge or bleeding noted on speculum exam.  Pap obtained.     Results for orders placed or performed in visit on 06/23/18 (from the past 72 hour(s))  POCT urine pregnancy     Status: None   Collection Time: 06/23/18 11:30 AM  Result Value Ref Range   Preg Test, Ur Negative Negative

## 2018-06-25 LAB — CYTOLOGY - PAP
DIAGNOSIS: UNDETERMINED — AB
HPV (WINDOPATH): NOT DETECTED

## 2018-06-26 ENCOUNTER — Telehealth: Payer: Self-pay | Admitting: Family Medicine

## 2018-06-26 DIAGNOSIS — R87610 Atypical squamous cells of undetermined significance on cytologic smear of cervix (ASC-US): Secondary | ICD-10-CM | POA: Insufficient documentation

## 2018-06-26 NOTE — Telephone Encounter (Signed)
Called and discussed ASCUS results with patient.  HPV negative.  Opted for repeat Pap smear in 1 year.  No questions.

## 2019-01-07 ENCOUNTER — Encounter (HOSPITAL_COMMUNITY): Payer: Self-pay | Admitting: *Deleted

## 2019-01-07 ENCOUNTER — Emergency Department (HOSPITAL_COMMUNITY): Payer: Self-pay

## 2019-01-07 ENCOUNTER — Other Ambulatory Visit: Payer: Self-pay

## 2019-01-07 ENCOUNTER — Emergency Department (HOSPITAL_COMMUNITY)
Admission: EM | Admit: 2019-01-07 | Discharge: 2019-01-07 | Disposition: A | Discharge status: Home / Self Care | Payer: Self-pay | Attending: Emergency Medicine | Admitting: Emergency Medicine

## 2019-01-07 DIAGNOSIS — R112 Nausea with vomiting, unspecified: Secondary | ICD-10-CM

## 2019-01-07 DIAGNOSIS — R1084 Generalized abdominal pain: Secondary | ICD-10-CM

## 2019-01-07 DIAGNOSIS — E876 Hypokalemia: Secondary | ICD-10-CM | POA: Insufficient documentation

## 2019-01-07 DIAGNOSIS — F17210 Nicotine dependence, cigarettes, uncomplicated: Secondary | ICD-10-CM | POA: Insufficient documentation

## 2019-01-07 DIAGNOSIS — I10 Essential (primary) hypertension: Secondary | ICD-10-CM | POA: Insufficient documentation

## 2019-01-07 DIAGNOSIS — N390 Urinary tract infection, site not specified: Secondary | ICD-10-CM | POA: Insufficient documentation

## 2019-01-07 DIAGNOSIS — N200 Calculus of kidney: Secondary | ICD-10-CM | POA: Insufficient documentation

## 2019-01-07 DIAGNOSIS — Z79899 Other long term (current) drug therapy: Secondary | ICD-10-CM | POA: Insufficient documentation

## 2019-01-07 DIAGNOSIS — R197 Diarrhea, unspecified: Secondary | ICD-10-CM

## 2019-01-07 LAB — CBC
HCT: 44.1 % (ref 36.0–46.0)
Hemoglobin: 14 g/dL (ref 12.0–15.0)
MCH: 28.5 pg (ref 26.0–34.0)
MCHC: 31.7 g/dL (ref 30.0–36.0)
MCV: 89.8 fL (ref 80.0–100.0)
Platelets: 289 10*3/uL (ref 150–400)
RBC: 4.91 MIL/uL (ref 3.87–5.11)
RDW: 14.4 % (ref 11.5–15.5)
WBC: 11.7 10*3/uL — ABNORMAL HIGH (ref 4.0–10.5)
nRBC: 0 % (ref 0.0–0.2)

## 2019-01-07 LAB — URINALYSIS, ROUTINE W REFLEX MICROSCOPIC
Bilirubin Urine: NEGATIVE
Glucose, UA: NEGATIVE mg/dL
Ketones, ur: NEGATIVE mg/dL
Nitrite: NEGATIVE
Protein, ur: 30 mg/dL — AB
Specific Gravity, Urine: 1.017 (ref 1.005–1.030)
WBC, UA: >50 WBC/hpf — ABNORMAL HIGH (ref 0–5)
pH: 6 (ref 5.0–8.0)

## 2019-01-07 LAB — COMPREHENSIVE METABOLIC PANEL
ALBUMIN: 4.5 g/dL (ref 3.5–5.0)
ALK PHOS: 93 U/L (ref 38–126)
ALT: 16 U/L (ref 0–44)
ANION GAP: 7 (ref 5–15)
AST: 15 U/L (ref 15–41)
BUN: 9 mg/dL (ref 6–20)
CO2: 27 mmol/L (ref 22–32)
Calcium: 9.2 mg/dL (ref 8.9–10.3)
Chloride: 104 mmol/L (ref 98–111)
Creatinine, Ser: 0.73 mg/dL (ref 0.44–1.00)
GFR calc Af Amer: >60 mL/min (ref >60)
GFR calc non Af Amer: >60 mL/min (ref >60)
GLUCOSE: 120 mg/dL — AB (ref 70–99)
Potassium: 2.8 mmol/L — ABNORMAL LOW (ref 3.5–5.1)
Sodium: 138 mmol/L (ref 135–145)
Total Bilirubin: 0.7 mg/dL (ref 0.3–1.2)
Total Protein: 8.7 g/dL — ABNORMAL HIGH (ref 6.5–8.1)

## 2019-01-07 LAB — LIPASE, BLOOD: Lipase: 30 U/L (ref 11–51)

## 2019-01-07 LAB — I-STAT BETA HCG BLOOD, ED (MC, WL, AP ONLY): I-stat hCG, quantitative: <5 m[IU]/mL (ref <5)

## 2019-01-07 MED ORDER — MORPHINE SULFATE (PF) 4 MG/ML IV SOLN
4.0000 mg | Freq: Once | INTRAVENOUS | Status: AC
  Administered 2019-01-07: 4 mg via INTRAVENOUS
  Filled 2019-01-07: qty 1

## 2019-01-07 MED ORDER — SODIUM CHLORIDE (PF) 0.9 % IJ SOLN
INTRAMUSCULAR | Status: AC
  Filled 2019-01-07: qty 50

## 2019-01-07 MED ORDER — IOPAMIDOL (ISOVUE-300) INJECTION 61%
100.0000 mL | Freq: Once | INTRAVENOUS | Status: AC | PRN
  Administered 2019-01-07: 100 mL via INTRAVENOUS

## 2019-01-07 MED ORDER — DICYCLOMINE HCL 20 MG PO TABS: 20.0000 mg | ORAL_TABLET | Freq: Three times a day (TID) | ORAL | 0 refills | Status: DC

## 2019-01-07 MED ORDER — POTASSIUM CHLORIDE CRYS ER 20 MEQ PO TBCR
40.0000 meq | EXTENDED_RELEASE_TABLET | Freq: Once | ORAL | Status: AC
  Administered 2019-01-07: 40 meq via ORAL
  Filled 2019-01-07: qty 2

## 2019-01-07 MED ORDER — ONDANSETRON 4 MG PO TBDP: 4.0000 mg | ORAL_TABLET | Freq: Three times a day (TID) | ORAL | 0 refills | Status: DC | PRN

## 2019-01-07 MED ORDER — IOPAMIDOL (ISOVUE-300) INJECTION 61%
INTRAVENOUS | Status: AC
  Filled 2019-01-07: qty 100

## 2019-01-07 MED ORDER — SODIUM CHLORIDE 0.9% FLUSH
3.0000 mL | Freq: Once | INTRAVENOUS | Status: AC
  Administered 2019-01-07: 3 mL via INTRAVENOUS

## 2019-01-07 MED ORDER — NITROFURANTOIN MONOHYD MACRO 100 MG PO CAPS: 100.0000 mg | ORAL_CAPSULE | Freq: Two times a day (BID) | ORAL | 0 refills | Status: DC

## 2019-01-07 MED ORDER — SODIUM CHLORIDE 0.9 % IV BOLUS
1000.0000 mL | Freq: Once | INTRAVENOUS | Status: AC
  Administered 2019-01-07: 1000 mL via INTRAVENOUS

## 2019-01-07 MED ORDER — ONDANSETRON HCL 4 MG/2ML IJ SOLN
4.0000 mg | Freq: Once | INTRAMUSCULAR | Status: AC
  Administered 2019-01-07: 4 mg via INTRAVENOUS
  Filled 2019-01-07: qty 2

## 2019-01-07 MED ORDER — POTASSIUM CHLORIDE ER 20 MEQ PO TBCR: 40.0000 meq | EXTENDED_RELEASE_TABLET | Freq: Every day | ORAL | 0 refills | Status: DC

## 2019-01-07 NOTE — ED Notes (Signed)
Pt has been drinking water for ~1 hour. Able to keep fluids down. No nausea/vomiting reported. RN aware.

## 2019-01-07 NOTE — Discharge Instructions (Addendum)
Today I have given you for prescriptions.  Macrobid is for your urinary tract infection.  Bentyl is to help with spasms in your stomach and guts.  Zofran is to help with nausea and vomiting.  Today your potassium was low so I have given you a prescription for potassium, please get this checked in one week.   Today you received medications that may make you sleepy or impair your ability to make decisions.  For the next 24 hours please do not drive, operate heavy machinery, care for a small child with out another adult present, or perform any activities that may cause harm to you or someone else if you were to fall asleep or be impaired.   Today we also discussed that your CT scan shows some changes in your liver, please follow-up with your primary care doctor.  You also have large stones in both of your kidneys.  Please follow-up with urology.  You may have diarrhea from the antibiotics.  It is very important that you continue to take the antibiotics even if you get diarrhea unless a medical professional tells you that you may stop taking them.  If you stop too early the bacteria you are being treated for will become stronger and you may need different, more powerful antibiotics that have more side effects and worsening diarrhea.  Please stay well hydrated and consider probiotics as they may decrease the severity of your diarrhea.  Please be aware that if you take any hormonal contraception (birth control pills, nexplanon, the ring, etc) that your birth control will not work while you are taking antibiotics and you need to use back up protection as directed on the birth control medication information insert.

## 2019-01-07 NOTE — ED Triage Notes (Addendum)
Pt reports RLQ pain x 1 week, started to have n/v/d today.  She also endorses decreased appetite.  Denies any urinary sxs.  LBM - today.

## 2019-01-07 NOTE — ED Provider Notes (Signed)
[facility]-EMERGENCY DEPT Provider Note   CSN: [id number] Arrival date & time: 01/07/19  1801    History   Chief Complaint Chief Complaint  Patient presents with   Abdominal Pain    HPI Marie Lawrence is a 38 y.o. female with a past medical history of kidney stones, hypertension, ovarian cysts, obesity, who presents today for evaluation of right lower quadrant abdominal pain.  She reports that for approximately 1 week she has had constant pain in her right lower quadrant.  This has worsened today and she has had nausea with 2 episodes of vomiting today.  She also reports 2 episodes of diarrhea today.  No blood in her vomit or diarrhea.  She still has her appendix.  She denies any dysuria, increased frequency or urgency.  She reports that she is sexually active with the same partner for the past 10 years, denies concern for STI.  She denies any vaginal discharge.  She has Depo-Provera and does not normally have menstrual cycles.     HPI  Past Medical History:  Diagnosis Date   History of kidney stones 08/2014   Hypertension    Kidney stone on left side 09/07/2014   09/05/14 - 9 by 13 mm left ureteral stone, multiple bilateral smaller stones    Ovarian cyst     Patient Active Problem List   Diagnosis Date Noted   ASCUS of cervix with negative high risk HPV 06/26/2018   Lumbago 09/03/2017   Obesity 10/25/2016   Birth control counseling 03/21/2016   Healthcare maintenance 06/20/2015   Essential hypertension 11/09/2007   TOBACCO DEPENDENCE 12/18/2006    Past Surgical History:  Procedure Laterality Date   CESAREAN SECTION  2000   CYSTOSCOPY WITH RETROGRADE PYELOGRAM, URETEROSCOPY AND STENT PLACEMENT Left 09/05/2014   Procedure: CYSTOSCOPY WITH RETROGRADE PYELOGRAM, URETEROSCOPY, STONE BASKETRY AND STENT PLACEMENT;  Surgeon: Anner CreteJohn J Wrenn, MD;  Location: [facility];  Service: Urology;  Laterality: Left;   HOLMIUM LASER APPLICATION Left  09/05/2014   Procedure: HOLMIUM LASER APPLICATION;  Surgeon: Anner CreteJohn J Wrenn, MD;  Location: [facility];  Service: Urology;  Laterality: Left;  digital ureteroscope     OB History    Gravida  2   Para  2   Term  1   Preterm  1   AB  0   Living  2     SAB  0   TAB  0   Ectopic  0   Multiple  0   Live Births  2            Home Medications    Prior to Admission medications   Medication Sig Start Date End Date Taking? Authorizing Provider  acetaminophen (TYLENOL) 325 MG tablet Take 650 mg by mouth every 6 (six) hours as needed for moderate pain or headache.   Yes [provider]  amLODipine (NORVASC) 10 MG tablet TAKE 1 TABLET(10 MG) BY MOUTH DAILY Patient taking differently: Take 10 mg by mouth daily.  04/28/18  Yes Tobey GrimWalden, Jeffrey H, MD  Aspirin Effervescent (ALKA-SELTZER ORIGINAL PO) Take 2 tablets by mouth daily as needed (cough).   Yes [provider]  hydrochlorothiazide (HYDRODIURIL) 25 MG tablet TAKE 1 TABLET(25 MG) BY MOUTH DAILY Patient taking differently: Take 25 mg by mouth daily.  04/28/18  Yes Tobey GrimWalden, Jeffrey H, MD  cetirizine (ZYRTEC) 10 MG tablet Take 1 tablet (10 mg total) by mouth daily. Patient not taking: Reported on 01/07/2019 03/20/18   Payton MccallumWalden, Jeffrey  H, MD  dicyclomine (BENTYL) 20 MG tablet Take 1 tablet (20 mg total) by mouth 4 (four) times daily -  before meals and at bedtime for 7 days. 01/07/19 01/14/19  Cristina Gong, PA-C  medroxyPROGESTERone (DEPO-PROVERA) 150 MG/ML injection Inject 1 mL (150 mg total) into the muscle every 3 (three) months. 09/02/12   Funches, Gerilyn Nestle, MD  nitrofurantoin, macrocrystal-monohydrate, (MACROBID) 100 MG capsule Take 1 capsule (100 mg total) by mouth 2 (two) times daily. 01/07/19   Cristina Gong, PA-C  ondansetron (ZOFRAN ODT) 4 MG disintegrating tablet Take 1 tablet (4 mg total) by mouth every 8 (eight) hours as needed for nausea or vomiting. 01/07/19   Cristina Gong, PA-C  potassium  chloride 20 MEQ TBCR Take 40 mEq by mouth daily for 7 days. 01/07/19 01/14/19  Cristina Gong, PA-C    Family History Family History  Problem Relation Age of Onset   Kidney disease Father    Diabetes Father    Hypertension Maternal Aunt    Diabetes Maternal Aunt    Hypertension Maternal Grandmother    Anesthesia problems Neg Hx    Hypotension Neg Hx    Malignant hyperthermia Neg Hx    Pseudochol deficiency Neg Hx     Social History Social History   Tobacco Use   Smoking status: Current Every Day Smoker    Packs/day: 0.10    Years: 12.00    Pack years: 1.20    Types: Cigarettes   Smokeless tobacco: Never Used  Substance Use Topics   Alcohol use: No   Drug use: No     Allergies   Penicillins   Review of Systems Review of Systems  Constitutional: Negative for activity change, chills and fever.  HENT: Negative for congestion.   Eyes: Negative for visual disturbance.  Respiratory: Negative for chest tightness and shortness of breath.   Cardiovascular: Negative for chest pain.  Gastrointestinal: Positive for abdominal pain, diarrhea, nausea and vomiting. Negative for blood in stool.  Genitourinary: Negative for decreased urine volume, difficulty urinating, dysuria, flank pain, frequency, genital sores, hematuria, menstrual problem, pelvic pain, vaginal bleeding, vaginal discharge and vaginal pain.  Musculoskeletal: Negative for back pain and neck pain.  Neurological: Negative for dizziness and weakness.  Psychiatric/Behavioral: Negative for confusion and self-injury.  All other systems reviewed and are negative.    Physical Exam Updated Vital Signs BP (!) 146/90    Pulse 60    Temp 98.8 F (37.1 C) (Oral)    Resp 16    SpO2 94%   Physical Exam Vitals signs and nursing note reviewed.  Constitutional:      General: She is not in acute distress.    Appearance: She is well-developed.  HENT:     Head: Normocephalic and atraumatic.      Mouth/Throat:     Mouth: Mucous membranes are moist.  Eyes:     Conjunctiva/sclera: Conjunctivae normal.  Neck:     Musculoskeletal: Neck supple.  Cardiovascular:     Rate and Rhythm: Normal rate and regular rhythm.     Heart sounds: Normal heart sounds. No murmur.  Pulmonary:     Effort: Pulmonary effort is normal. No respiratory distress.     Breath sounds: Normal breath sounds.  Abdominal:     General: Bowel sounds are normal. There is no distension.     Palpations: Abdomen is soft.     Tenderness: There is abdominal tenderness in the right lower quadrant. There is no guarding or rebound.  Hernia: A hernia is present. Hernia is present in the umbilical area.  Skin:    General: Skin is warm and dry.  Neurological:     General: No focal deficit present.     Mental Status: She is alert.     Cranial Nerves: No cranial nerve deficit.  Psychiatric:        Mood and Affect: Mood normal. Mood is not anxious.        Behavior: Behavior normal.      ED Treatments / Results  Labs (all labs ordered are listed, but only abnormal results are displayed) Labs Reviewed  COMPREHENSIVE METABOLIC PANEL - Abnormal; Notable for the following components:      Result Value   Potassium 2.8 (*)    Glucose, Bld 120 (*)    Total Protein 8.7 (*)    All other components within normal limits  CBC - Abnormal; Notable for the following components:   WBC 11.7 (*)    All other components within normal limits  LIPASE, BLOOD  URINALYSIS, ROUTINE W REFLEX MICROSCOPIC  I-STAT BETA HCG BLOOD, ED ([facility], [facility], [facility] ONLY)    EKG None  Radiology Ct Abdomen Pelvis W Contrast  Result Date: 01/07/2019 CLINICAL DATA:  Right lower quadrant pain with nausea and vomiting EXAM: CT ABDOMEN AND PELVIS WITH CONTRAST TECHNIQUE: Multidetector CT imaging of the abdomen and pelvis was performed using the standard protocol following bolus administration of intravenous contrast. CONTRAST:  ISOVUE-300 IOPAMIDOL  (ISOVUE-300) INJECTION 61% COMPARISON:  September 04, 2014 FINDINGS: Lower chest: There is slight left base atelectasis. There is no lung edema consolidation. Hepatobiliary: There is hepatic steatosis, somewhat more severe near the fissure for the ligamentum teres than elsewhere. No focal liver lesions are appreciable. There is no appreciable gallbladder wall thickening or pericholecystic fluid. Pancreas: There is no appreciable pancreatic mass or inflammatory focus. Spleen: No splenic lesions are evident. Adrenals/Urinary Tract: Adrenals bilaterally appear normal. Kidneys bilaterally show no evident mass or hydronephrosis on either side. There is a calculus in the lower pole of the right kidney measuring 9 x 7 mm. There is a calculus in the upper to mid left kidney measuring 1.2 x 1.0 cm. There is no evident ureteral calculus on either side. Urinary bladder is midline with wall thickness within normal limits. Stomach/Bowel: There is no appreciable bowel wall or mesenteric thickening. There is no evident bowel obstruction. There is no free air or portal venous air. Vascular/Lymphatic: There is no abdominal aortic aneurysm. There is slight atherosclerotic calcification in the proximal right common iliac artery. There is no adenopathy in the abdomen or pelvis. Reproductive: The uterus is anteverted. There is no evident pelvic mass. Other: The appendix appears normal. There is no appreciable abscess or ascites in the abdomen or pelvis. There is a focal ventral hernia containing only fat. Musculoskeletal: No blastic or lytic bone lesions. No intramuscular lesions are evident. IMPRESSION: 1. Nonobstructing calculus in each kidney. No hydronephrosis or ureteral calculus on either side. Urinary bladder wall thickness within normal limits. 2. Appendix appears normal. No bowel obstruction. No abscess in the abdomen or pelvis. 3.  Focal ventral hernia containing only fat. 4.  Hepatic steatosis. Electronically Signed   By:  Bretta Bang III M.D.   On: 01/07/2019 20:33    Procedures Procedures (including critical care time)  Medications Ordered in ED Medications  iopamidol (ISOVUE-300) 61 % injection (has no administration in time range)  sodium chloride (PF) 0.9 % injection (has no administration in time  range)  sodium chloride flush (NS) 0.9 % injection 3 mL (3 mLs Intravenous Given 01/07/19 1948)  sodium chloride 0.9 % bolus 1,000 mL (1,000 mLs Intravenous New Bag/Given 01/07/19 1944)  morphine 4 MG/ML injection 4 mg (4 mg Intravenous Given 01/07/19 1944)  ondansetron (ZOFRAN) injection 4 mg (4 mg Intravenous Given 01/07/19 1944)  potassium chloride SA (K-DUR,KLOR-CON) CR tablet 40 mEq (40 mEq Oral Given 01/07/19 1944)  iopamidol (ISOVUE-300) 61 % injection 100 mL (100 mLs Intravenous Contrast Given 01/07/19 1954)     Initial Impression / Assessment and Plan / ED Course  I have reviewed the triage vital signs and the nursing notes.  Pertinent labs & imaging results that were available during my care of the patient were reviewed by me and considered in my medical decision making (see chart for details).  Clinical Course as of Jan 06 2241  Thu Jan 07, 2019  2228 Per lab tech UA: small hemoglobin, 30 protein, leuk large, RBC, 0-5, WBC>50   [EH]  2233 Patient reevaluated, she states she is ready to go home.  Will discharge.   [EH]    Clinical Course User Index [EH] Cristina Gong, PA-C       Patient presents today for evaluation of right lower quadrant abdominal pain.  She has had this going on for approximately 5 days.  She today she developed nausea vomiting and diarrhea.  She does still have her appendix, and therefore concern for appendicitis.  Labs were obtained and reviewed, her potassium is low at 2.8.  She is given p.o. replacement here and a prescription for continued replacement at home with instructions to follow-up for recheck.  Count is slightly elevated at 11.7.  She is afebrile, not  tachycardic.  She is hypertensive however that is known for her.  hCG is negative.  Lipase is not elevated.  UA, according to lab tech, shows small hemoglobin, 30 protein, large leukocytes with microscopy showing over 50 WBCs.  Based on this we will treat her for UTI.  CT scan shows bilateral nonobstructing over 1 cm renal stones in addition to a ventral hernia containing only fat and hepatic steatosis.  We discussed indications for pelvic exam, however patient refused at this time.  Reassured by the lack of pelvic abnormality on her scan.    Her pain was treated in the emergency room with morphine and she was also given Zofran to help with nausea, after this she was able to p.o. challenge without difficulty.  Will give prescriptions for antibiotics to treat UTI, Zofran to help with nausea, Bentyl to help with abdominal pain, and potassium as she is hypokalemic.  She was able to p.o. challenge after Zofran without difficulty.  Return precautions were discussed with patient who states their understanding.  At the time of discharge patient denied any unaddressed complaints or concerns.  Patient is agreeable for discharge home.   Final Clinical Impressions(s) / ED Diagnoses   Final diagnoses:  Generalized abdominal pain  Nausea vomiting and diarrhea  Lower urinary tract infectious disease  Hypokalemia  Bilateral renal stones    ED Discharge Orders         Ordered    potassium chloride 20 MEQ TBCR  Daily     01/07/19 2238    ondansetron (ZOFRAN ODT) 4 MG disintegrating tablet  Every 8 hours PRN     01/07/19 2238    dicyclomine (BENTYL) 20 MG tablet  3 times daily before meals & bedtime  01/07/19 2238    nitrofurantoin, macrocrystal-monohydrate, (MACROBID) 100 MG capsule  2 times daily     01/07/19 2238           Norman Clay 01/07/19 2246    Lorre Nick, MD 01/11/19 [phone]

## 2019-03-18 ENCOUNTER — Encounter: Payer: Self-pay | Admitting: Family Medicine

## 2019-03-18 ENCOUNTER — Other Ambulatory Visit: Payer: Self-pay

## 2019-03-18 ENCOUNTER — Ambulatory Visit: Payer: Self-pay | Admitting: Family Medicine

## 2019-03-18 VITALS — BP 138/76 | HR 91

## 2019-03-18 DIAGNOSIS — Z3009 Encounter for other general counseling and advice on contraception: Secondary | ICD-10-CM

## 2019-03-18 DIAGNOSIS — F17209 Nicotine dependence, unspecified, with unspecified nicotine-induced disorders: Secondary | ICD-10-CM

## 2019-03-18 DIAGNOSIS — Z30013 Encounter for initial prescription of injectable contraceptive: Secondary | ICD-10-CM

## 2019-03-18 DIAGNOSIS — F17200 Nicotine dependence, unspecified, uncomplicated: Secondary | ICD-10-CM

## 2019-03-18 DIAGNOSIS — Z716 Tobacco abuse counseling: Secondary | ICD-10-CM

## 2019-03-18 LAB — POCT URINE PREGNANCY: Preg Test, Ur: NEGATIVE

## 2019-03-18 MED ORDER — MEDROXYPROGESTERONE ACETATE 150 MG/ML IM SUSP
150.0000 mg | Freq: Once | INTRAMUSCULAR | Status: AC
  Administered 2019-03-18: 17:00:00 150 mg via INTRAMUSCULAR

## 2019-03-18 NOTE — Progress Notes (Signed)
  Established Patient - Clinic Visit Subjective  Subjective  Patient ID: MRN [id number], Date of birth: 16-Nov-1980  PCP: Tobey Grim, MD  CC: Contraception  HPI:  Marie Lawrence is a 38 y.o. female who presents to clinic for the following:  # Restart Depo  Patient reports that she is currently sexually active.  She has been receiving Depakote shots every 3 months, but admits to not keeping up with it in the last year.  Patient has been on the pill for several years, including prior to his recent pregnancy.  Was originally started on Depakote due to bad cramps and abdominal pain.  Patient is not sure if she wants to continue to have children in the future, but not does not plan to at this time. Patient has not had any issues with the Depo shot in the past.  She declines STD and HIV screening today.  She denies any history of blood clots.  Patient does motorize.  No issues with depo shot.  Declines STD and HIV screening.   ROS: See HPI HISTORY Meds  Allergies  PMHx: Reviewed as appropriate  Tobacco use, obesity, ASCUS w/ negative high risk HPV, HTN, Ovarian cysts Social Hx: Marie reports that she has been smoking cigarettes. She has a 1.20 pack-year smoking history. She has never used smokeless tobacco. She reports that she does not drink alcohol or use drugs.     Objective   Objective  Physical Exam:  BP 138/76   Pulse 91   SpO2 98%  General: NAD, non-toxic, well-appearing, sitting comfortably in chair   Integumentary: No obvious rashes, lesions, trauma on general exam. HEENT: [place]/AT. PERRLA. EOMI.  Cardiovascular: RRR, normal S1, S2. B/L 2+ RP. No BLEE Respiratory: CTAB. No IWOB.  Abdomen: + BS. NT, ND, soft to palpation.  Extremities: Warm and well perfused. Moving spontaneously.  Neuro: A & O x4. CN grossly intact. No FND   Pertinent Labs & Imaging:  Reviewed in chart as appropriate    Assessment  Assessment & Plan  Birth control counseling Patient will  continue depo today.  She will return in 3 months for her next dose.  At that appointment, patient is aware that she will need a Pap smear for history of ASCUS without HPV.  Patient counseled on tobacco use as it increases her risk of clots, patient is aware and will let us know when she is ready quit smoking.  TOBACCO DEPENDENCE Patient is a current smoker.  She does not plan on quitting at this time.  She is aware of increased risk with simultaneous birth control.    Genia Lawrence, M.D. [facility]  PGY -1 03/19/2019, 5:38 PM

## 2019-03-18 NOTE — Progress Notes (Signed)
21 

## 2019-03-19 ENCOUNTER — Encounter: Payer: Self-pay | Admitting: Family Medicine

## 2019-03-19 DIAGNOSIS — Z30013 Encounter for initial prescription of injectable contraceptive: Secondary | ICD-10-CM | POA: Insufficient documentation

## 2019-03-19 NOTE — Assessment & Plan Note (Signed)
Patient will continue depo today.  She will return in 3 months for her next dose.  At that appointment, patient is aware that she will need a Pap smear for history of ASCUS without HPV.  Patient counseled on tobacco use as it increases her risk of clots, patient is aware and will let us know when she is ready quit smoking.

## 2019-03-19 NOTE — Assessment & Plan Note (Signed)
Patient is a current smoker.  She does not plan on quitting at this time.  She is aware of increased risk with simultaneous birth control.

## 2019-06-04 ENCOUNTER — Other Ambulatory Visit: Payer: Self-pay

## 2019-06-08 MED ORDER — AMLODIPINE BESYLATE 10 MG PO TABS: ORAL_TABLET | ORAL | 0 refills | Status: DC

## 2019-06-08 MED ORDER — HYDROCHLOROTHIAZIDE 25 MG PO TABS: ORAL_TABLET | ORAL | 0 refills | Status: DC

## 2019-06-08 NOTE — Telephone Encounter (Signed)
Dear Dema Severin Team Please let her know I have refilled her blood pressure meds for only one month as she needs a follow up appointment with me or another provider. We need to recheck her blood work--in May herpotassium was low and taht can be a side effect of one of her meds. Also she is a former Dr Mingo Amber patient and I need to establish care (get to know her) THANKS! Dorcas Mcmurray

## 2019-06-08 NOTE — Telephone Encounter (Signed)
Contacted pt and scheduled her for 06/16/2019. April Zimmerman Rumple, CMA

## 2019-06-16 ENCOUNTER — Other Ambulatory Visit: Payer: Self-pay

## 2019-06-16 ENCOUNTER — Ambulatory Visit (INDEPENDENT_AMBULATORY_CARE_PROVIDER_SITE_OTHER): Payer: Self-pay | Admitting: Family Medicine

## 2019-06-16 ENCOUNTER — Encounter: Payer: Self-pay | Admitting: Family Medicine

## 2019-06-16 VITALS — BP 150/90 | HR 80 | Temp 98.4°F | Wt 190.0 lb

## 2019-06-16 DIAGNOSIS — R87610 Atypical squamous cells of undetermined significance on cytologic smear of cervix (ASC-US): Secondary | ICD-10-CM

## 2019-06-16 DIAGNOSIS — Z3042 Encounter for surveillance of injectable contraceptive: Secondary | ICD-10-CM

## 2019-06-16 DIAGNOSIS — I10 Essential (primary) hypertension: Secondary | ICD-10-CM

## 2019-06-16 MED ORDER — MEDROXYPROGESTERONE ACETATE 150 MG/ML IM SUSP
150.0000 mg | Freq: Once | INTRAMUSCULAR | Status: AC
  Administered 2019-06-16: 09:00:00 150 mg via INTRAMUSCULAR

## 2019-06-16 NOTE — Progress Notes (Signed)
Depo injection given RUOQ, site unremarkable. Patient due for next depo 11/11-11/25.

## 2019-06-17 ENCOUNTER — Encounter: Payer: Self-pay | Admitting: Family Medicine

## 2019-06-17 LAB — BASIC METABOLIC PANEL
BUN/Creatinine Ratio: 12 (ref 9–23)
BUN: 9 mg/dL (ref 6–20)
CO2: 22 mmol/L (ref 20–29)
Calcium: 8.7 mg/dL (ref 8.7–10.2)
Chloride: 103 mmol/L (ref 96–106)
Creatinine, Ser: 0.74 mg/dL (ref 0.57–1.00)
GFR calc Af Amer: 121 mL/min/{1.73_m2} (ref >59)
GFR calc non Af Amer: 105 mL/min/{1.73_m2} (ref >59)
Glucose: 106 mg/dL — ABNORMAL HIGH (ref 65–99)
Potassium: 3.6 mmol/L (ref 3.5–5.2)
Sodium: 141 mmol/L (ref 134–144)

## 2019-06-17 NOTE — Assessment & Plan Note (Signed)
Reviewed her Pap smear results and ran through the new algorithm.  We can have a Pap smear with cotesting in 3 years from her original Pap.  Discussed.

## 2019-06-17 NOTE — Progress Notes (Signed)
    CHIEF COMPLAINT / HPI: Here for Depo-Provera shot and I had requested she come back for repeat labs.  She has not been taking her potassium supplement.  She thought she was only supposed to take it for a month.  She also was not taking her blood pressure medicines yesterday or today because her grandmother died yesterday and she is just been out of her typical routine.  REVIEW OF SYSTEMS: No chest pain, no dizziness, no headaches.  PERTINENT  PMH / PSH: I have reviewed the patient's medications, allergies, past medical and surgical history, smoking status and updated in the EMR as appropriate.   OBJECTIVE:  Vital signs reviewed. GENERAL: Well-developed, well-nourished, no acute distress. CARDIOVASCULAR: Regular rate and rhythm no murmur gallop or rub LUNGS: Clear to auscultation bilaterally, no rales or wheeze. ABDOMEN: Soft positive bowel sounds NEURO: No gross focal neurological deficits. MSK: Movement of extremity x 4.    ASSESSMENT / PLAN:   ASCUS of cervix with negative high risk HPV Reviewed her Pap smear results and ran through the new algorithm.  We can have a Pap smear with cotesting in 3 years from her original Pap.  Discussed.  Essential hypertension Is hard to know what her blood pressure is since she is not taking her pills in about 48 hours.  I would check her blood work today.  I like to see her back in the next 1 month or so and hopefully she can bring recent blood pressure readings.

## 2019-06-17 NOTE — Assessment & Plan Note (Signed)
Is hard to know what her blood pressure is since she is not taking her pills in about 48 hours.  I would check her blood work today.  I like to see her back in the next 1 month or so and hopefully she can bring recent blood pressure readings.

## 2019-06-18 ENCOUNTER — Encounter: Payer: Self-pay | Admitting: Family Medicine

## 2019-12-31 ENCOUNTER — Other Ambulatory Visit: Payer: Self-pay | Admitting: *Deleted

## 2020-01-01 ENCOUNTER — Other Ambulatory Visit: Payer: Self-pay | Admitting: Family Medicine

## 2020-01-01 MED ORDER — HYDROCHLOROTHIAZIDE 25 MG PO TABS: ORAL_TABLET | ORAL | 0 refills | Status: DC

## 2020-04-12 ENCOUNTER — Other Ambulatory Visit: Payer: Self-pay

## 2020-04-12 ENCOUNTER — Encounter: Payer: Self-pay | Admitting: Family Medicine

## 2020-04-12 ENCOUNTER — Other Ambulatory Visit: Payer: Self-pay | Admitting: Family Medicine

## 2020-04-12 ENCOUNTER — Ambulatory Visit (INDEPENDENT_AMBULATORY_CARE_PROVIDER_SITE_OTHER): Payer: Self-pay | Admitting: Family Medicine

## 2020-04-12 VITALS — BP 132/88 | HR 87 | Wt 193.0 lb

## 2020-04-12 DIAGNOSIS — Z30013 Encounter for initial prescription of injectable contraceptive: Secondary | ICD-10-CM

## 2020-04-12 DIAGNOSIS — Z3009 Encounter for other general counseling and advice on contraception: Secondary | ICD-10-CM

## 2020-04-12 DIAGNOSIS — I10 Essential (primary) hypertension: Secondary | ICD-10-CM

## 2020-04-12 LAB — POCT URINE PREGNANCY: Preg Test, Ur: NEGATIVE

## 2020-04-12 MED ORDER — MEDROXYPROGESTERONE ACETATE 150 MG/ML IM SUSP
150.0000 mg | Freq: Once | INTRAMUSCULAR | Status: AC
  Administered 2020-04-12: 150 mg via INTRAMUSCULAR

## 2020-04-12 MED ORDER — AMLODIPINE BESYLATE 10 MG PO TABS: ORAL_TABLET | ORAL | 0 refills | Status: DC

## 2020-04-12 NOTE — Progress Notes (Signed)
    SUBJECTIVE:   CHIEF COMPLAINT / HPI:   Depo shot: Patient last had a menstrual cycle 2 weeks ago.  When she is receiving the Depo shot she does not have menstrual bleeding, but will still have other symptoms such as cramping bloating.  Patient has not had sexual intercourse since her last menstrual cycle.  No fevers, no abdominal pain or vaginal discharge during this time.  HTN: needs refill on amlodipine. The last time she took it was a week ago.  Was taking it daily before that.    PERTINENT  PMH / PSH: Hypertension  OBJECTIVE:   BP 132/88   Pulse 87   Wt 193 lb (87.5 kg)   SpO2 99%   BMI 32.12 kg/m   General: Alert and oriented.  No acute distress.  Appears stated age. CV: Regular rate and rhythm.  No murmurs.   ASSESSMENT/PLAN:   Encounter for initial prescription of injectable contraceptive Is past the 34-month window for Depo shot.  UPT negative.  Will give Depo shot today.  Essential hypertension Currently out of amlodipine.  Has plenty of her other antihypertensive medication.  Will refill amlodipine today.     Sandre Kitty, MD [facility]

## 2020-04-12 NOTE — Patient Instructions (Addendum)
It was nice to meet you today,  I have refilled your amlodipine.  We will give you the Depo shot today.  Your next one is due in 3 months.  I have included below some information on kidney stone and proper diet.  The most important thing is that you stay hydrated.  Have a great day,  Clemetine Marker, MD  Dietary Guidelines to Help Prevent Kidney Stones Kidney stones are deposits of minerals and salts that form inside your kidneys. Your risk of developing kidney stones may be greater depending on your diet, your lifestyle, the medicines you take, and whether you have certain medical conditions. Most people can reduce their chances of developing kidney stones by following the instructions below. Depending on your overall health and the type of kidney stones you tend to develop, your dietitian may give you more specific instructions. What are tips for following this plan? Reading food labels  Choose foods with "no salt added" or "low-salt" labels. Limit your sodium intake to less than 1500 mg per day.  Choose foods with calcium for each meal and snack. Try to eat about 300 mg of calcium at each meal. Foods that contain 200-500 mg of calcium per serving include: ? 8 oz (237 ml) of milk, fortified nondairy milk, and fortified fruit juice. ? 8 oz (237 ml) of kefir, yogurt, and soy yogurt. ? 4 oz (118 ml) of tofu. ? 1 oz of cheese. ? 1 cup (300 g) of dried figs. ? 1 cup (91 g) of cooked broccoli. ? 1-3 oz can of sardines or mackerel.  Most people need 1000 to 1500 mg of calcium each day. Talk to your dietitian about how much calcium is recommended for you. Shopping  Buy plenty of fresh fruits and vegetables. Most people do not need to avoid fruits and vegetables, even if they contain nutrients that may contribute to kidney stones.  When shopping for convenience foods, choose: ? Whole pieces of fruit. ? Premade salads with dressing on the side. ? Low-fat fruit and yogurt smoothies.  Avoid buying  frozen meals or prepared deli foods.  Look for foods with live cultures, such as yogurt and kefir. Cooking  Do not add salt to food when cooking. Place a salt shaker on the table and allow each person to add his or her own salt to taste.  Use vegetable protein, such as beans, textured vegetable protein (TVP), or tofu instead of meat in pasta, casseroles, and soups. Meal planning   Eat less salt, if told by your dietitian. To do this: ? Avoid eating processed or premade food. ? Avoid eating fast food.  Eat less animal protein, including cheese, meat, poultry, or fish, if told by your dietitian. To do this: ? Limit the number of times you have meat, poultry, fish, or cheese each week. Eat a diet free of meat at least 2 days a week. ? Eat only one serving each day of meat, poultry, fish, or seafood. ? When you prepare animal protein, cut pieces into small portion sizes. For most meat and fish, one serving is about the size of one deck of cards.  Eat at least 5 servings of fresh fruits and vegetables each day. To do this: ? Keep fruits and vegetables on hand for snacks. ? Eat 1 piece of fruit or a handful of berries with breakfast. ? Have a salad and fruit at lunch. ? Have two kinds of vegetables at dinner.  Limit foods that are high in a  substance called oxalate. These include: ? Spinach. ? Rhubarb. ? Beets. ? Potato chips and french fries. ? Nuts.  If you regularly take a diuretic medicine, make sure to eat at least 1-2 fruits or vegetables high in potassium each day. These include: ? Avocado. ? Banana. ? Orange, prune, carrot, or tomato juice. ? Baked potato. ? Cabbage. ? Beans and split peas. General instructions   Drink enough fluid to keep your urine clear or pale yellow. This is the most important thing you can do.  Talk to your health care provider and dietitian about taking daily supplements. Depending on your health and the cause of your kidney stones, you may be  advised: ? Not to take supplements with vitamin C. ? To take a calcium supplement. ? To take a daily probiotic supplement. ? To take other supplements such as magnesium, fish oil, or vitamin B6.  Take all medicines and supplements as told by your health care provider.  Limit alcohol intake to no more than 1 drink a day for nonpregnant women and 2 drinks a day for men. One drink equals 12 oz of beer, 5 oz of wine, or 1 oz of hard liquor.  Lose weight if told by your health care provider. Work with your dietitian to find strategies and an eating plan that works best for you. What foods are not recommended? Limit your intake of the following foods, or as told by your dietitian. Talk to your dietitian about specific foods you should avoid based on the type of kidney stones and your overall health. Grains Breads. Bagels. Rolls. Baked goods. Salted crackers. Cereal. Pasta. Vegetables Spinach. Rhubarb. Beets. Canned vegetables. Rosita Fire. Olives. Meats and other protein foods Nuts. Nut butters. Large portions of meat, poultry, or fish. Salted or cured meats. Deli meats. Hot dogs. Sausages. Dairy Cheese. Beverages Regular soft drinks. Regular vegetable juice. Seasonings and other foods Seasoning blends with salt. Salad dressings. Canned soups. Soy sauce. Ketchup. Barbecue sauce. Canned pasta sauce. Casseroles. Pizza. Lasagna. Frozen meals. Potato chips. [place] fries. Summary  You can reduce your risk of kidney stones by making changes to your diet.  The most important thing you can do is drink enough fluid. You should drink enough fluid to keep your urine clear or pale yellow.  Ask your health care provider or dietitian how much protein from animal sources you should eat each day, and also how much salt and calcium you should have each day. This information is not intended to replace advice given to you by your health care provider. Make sure you discuss any questions you have with your health  care provider. Document Revised: 01/27/2019 Document Reviewed: 09/17/2016 Elsevier Patient Education  2020 [employer].

## 2020-04-13 NOTE — Assessment & Plan Note (Signed)
Is past the 63-month window for Depo shot.  UPT negative.  Will give Depo shot today.

## 2020-04-13 NOTE — Assessment & Plan Note (Signed)
Currently out of amlodipine.  Has plenty of her other antihypertensive medication.  Will refill amlodipine today.

## 2020-06-02 ENCOUNTER — Encounter (HOSPITAL_COMMUNITY): Payer: Self-pay

## 2020-06-02 ENCOUNTER — Other Ambulatory Visit: Payer: Self-pay

## 2020-06-02 ENCOUNTER — Ambulatory Visit (HOSPITAL_COMMUNITY)
Admission: EM | Admit: 2020-06-02 | Discharge: 2020-06-02 | Disposition: A | Discharge status: Home / Self Care | Payer: Self-pay | Attending: Family Medicine | Admitting: Family Medicine

## 2020-06-02 DIAGNOSIS — H6501 Acute serous otitis media, right ear: Secondary | ICD-10-CM

## 2020-06-02 DIAGNOSIS — H6981 Other specified disorders of Eustachian tube, right ear: Secondary | ICD-10-CM

## 2020-06-02 MED ORDER — FLUTICASONE PROPIONATE 50 MCG/ACT NA SUSP: 2.0000 | Freq: Every day | NASAL | 0 refills | Status: DC

## 2020-06-02 MED ORDER — CETIRIZINE HCL 10 MG PO TABS: 10.0000 mg | ORAL_TABLET | Freq: Every day | ORAL | 0 refills | Status: DC

## 2020-06-02 NOTE — Discharge Instructions (Signed)
Use the Flonase daily. Take Zyrtec. Take Tylenol Follow-up with primary care as needed

## 2020-06-02 NOTE — ED Provider Notes (Signed)
[facility]    CSN: [id number] Arrival date & time: 06/02/20  1912      History   Chief Complaint Chief Complaint  Patient presents with  . Otalgia    HPI Marie Lawrence is a 39 y.o. female.   Patient presents for right ear pain.  She reports this is been going on for the last 5 or 6 days.  She reports this started after swimming and some recent travel to altitude.  She reports feeling ear popping in the right ear.  She reports pain is actually worse with opening closing the jaw.  She has not noticed any drainage.  No fevers or chills.  No previous upper respiratory symptoms.  Patient is unsure whether she took her blood pressure medicine today.  Denies chest pain, shortness of breath, headache, dizziness.     Past Medical History:  Diagnosis Date  . History of kidney stones 08/2014  . Hypertension   . Kidney stone on left side 09/07/2014   09/05/14 - 9 by 13 mm left ureteral stone, multiple bilateral smaller stones   . Ovarian cyst     Patient Active Problem List   Diagnosis Date Noted  . Encounter for initial prescription of injectable contraceptive 03/19/2019  . ASCUS of cervix with negative high risk HPV 06/26/2018  . Obesity 10/25/2016  . Birth control counseling 03/21/2016  . Healthcare maintenance 06/20/2015  . Essential hypertension 11/09/2007  . TOBACCO DEPENDENCE 12/18/2006    Past Surgical History:  Procedure Laterality Date  . CESAREAN SECTION  2000  . CYSTOSCOPY WITH RETROGRADE PYELOGRAM, URETEROSCOPY AND STENT PLACEMENT Left 09/05/2014   Procedure: CYSTOSCOPY WITH RETROGRADE PYELOGRAM, URETEROSCOPY, STONE BASKETRY AND STENT PLACEMENT;  Surgeon: Anner Crete, MD;  Location: [facility];  Service: Urology;  Laterality: Left;  . HOLMIUM LASER APPLICATION Left 09/05/2014   Procedure: HOLMIUM LASER APPLICATION;  Surgeon: Anner Crete, MD;  Location: [facility];  Service: Urology;  Laterality: Left;  digital ureteroscope    OB History    Gravida  2     Para  2   Term  1   Preterm  1   AB  0   Living  2     SAB  0   TAB  0   Ectopic  0   Multiple  0   Live Births  2            Home Medications    Prior to Admission medications   Medication Sig Start Date End Date Taking? Authorizing Provider  amLODipine (NORVASC) 10 MG tablet TAKE 1 TABLET(10 MG) BY MOUTH DAILY 04/13/20   Nestor Ramp, MD  cetirizine (ZYRTEC ALLERGY) 10 MG tablet Take 1 tablet (10 mg total) by mouth daily. 06/02/20   Shantale Holtmeyer, Veryl Speak, PA-C  fluticasone (FLONASE) 50 MCG/ACT nasal spray Place 2 sprays into both nostrils daily. 06/02/20   Amadea Keagy, Veryl Speak, PA-C  hydrochlorothiazide (HYDRODIURIL) 25 MG tablet TAKE 1 TABLET(25 MG) BY MOUTH DAILY 01/04/20   Nestor Ramp, MD  medroxyPROGESTERone (DEPO-PROVERA) 150 MG/ML injection Inject 1 mL (150 mg total) into the muscle every 3 (three) months. 09/02/12   Dessa Phi, MD    Family History Family History  Problem Relation Age of Onset  . Kidney disease Father   . Diabetes Father   . Hypertension Maternal Aunt   . Diabetes Maternal Aunt   . Hypertension Maternal Grandmother   . Anesthesia problems Neg Hx   . Hypotension Neg Hx   .  Malignant hyperthermia Neg Hx   . Pseudochol deficiency Neg Hx     Social History Social History   Tobacco Use  . Smoking status: Current Every Day Smoker    Packs/day: 0.10    Years: 12.00    Pack years: 1.20    Types: Cigarettes  . Smokeless tobacco: Never Used  Substance Use Topics  . Alcohol use: No  . Drug use: No     Allergies   Penicillins   Review of Systems Review of Systems   Physical Exam Triage Vital Signs ED Triage Vitals  Enc Vitals Group     BP 06/02/20 2005 (!) 193/96     Pulse Rate 06/02/20 2005 69     Resp 06/02/20 2005 18     Temp 06/02/20 2005 98.6 F (37 C)     Temp Source 06/02/20 2005 Oral     SpO2 06/02/20 2005 100 %     Weight --      Height --      Head Circumference --      Peak Flow --      Pain Score 06/02/20 2004  8     Pain Loc --      Pain Edu? --      Excl. in GC? --    No data found.  Updated Vital Signs BP (!) 193/96 (BP Location: Right Arm)   Pulse 69   Temp 98.6 F (37 C) (Oral)   Resp 18   SpO2 100%   Visual Acuity Right Eye Distance:   Left Eye Distance:   Bilateral Distance:    Right Eye Near:   Left Eye Near:    Bilateral Near:     Physical Exam Vitals and nursing note reviewed.  Constitutional:      General: She is not in acute distress.    Appearance: Normal appearance. She is not ill-appearing.  HENT:     Left Ear: Tympanic membrane, ear canal and external ear normal.     Ears:     Comments: Right ear: without swelling or erythema.  No pain with manipulation of the pinna or tragus.  Right ear canal without erythema or swelling.  Tympanic membrane gray with slight serous effusion.    Nose: Nose normal.     Mouth/Throat:     Mouth: Mucous membranes are moist.     Pharynx: Oropharynx is clear. No oropharyngeal exudate or posterior oropharyngeal erythema.  Eyes:     Extraocular Movements: Extraocular movements intact.     Pupils: Pupils are equal, round, and reactive to light.  Neurological:     Mental Status: She is alert.      UC Treatments / Results  Labs (all labs ordered are listed, but only abnormal results are displayed) Labs Reviewed - No data to display  EKG   Radiology No results found.  Procedures Procedures (including critical care time)  Medications Ordered in UC Medications - No data to display  Initial Impression / Assessment and Plan / UC Course  I have reviewed the triage vital signs and the nursing notes.  Pertinent labs & imaging results that were available during my care of the patient were reviewed by me and considered in my medical decision making (see chart for details).     #Serous otitis media #Eustachian tube dysfunction Patient 39 year old presenting with a serous otitis in the right ear likely secondary to  eustachian tube dysfunction.  No evidence of infection.  Will trial Flonase and Zyrtec.  Discussed patient's blood pressure, asymptomatic.  She is on blood pressure medicine.  Has primary care.  Instructed her to ensure she is taking her blood pressure and gave strict emergency department precautions as well as instruct her to follow-up with her primary care.  Patient verbalized understanding plan of care Final Clinical Impressions(s) / UC Diagnoses   Final diagnoses:  Right acute serous otitis media, recurrence not specified  Eustachian tube dysfunction, right     Discharge Instructions     Use the Flonase daily. Take Zyrtec. Take Tylenol Follow-up with primary care as needed    ED Prescriptions    Medication Sig Dispense Auth. Provider   fluticasone (FLONASE) 50 MCG/ACT nasal spray Place 2 sprays into both nostrils daily. 15.8 mL Makalia Bare, Veryl Speak, PA-C   cetirizine (ZYRTEC ALLERGY) 10 MG tablet Take 1 tablet (10 mg total) by mouth daily. 14 tablet Mikenna Bunkley, Veryl Speak, PA-C     PDMP not reviewed this encounter.   Hermelinda Medicus, PA-C 06/02/20 [phone]

## 2020-06-02 NOTE — ED Triage Notes (Signed)
Pt presents with right ear pain x 2-3 days. States she being swimming at the pool for the past couples days. Denies fever, chills.

## 2020-07-10 ENCOUNTER — Ambulatory Visit (INDEPENDENT_AMBULATORY_CARE_PROVIDER_SITE_OTHER): Payer: Self-pay

## 2020-07-10 ENCOUNTER — Other Ambulatory Visit: Payer: Self-pay

## 2020-07-10 DIAGNOSIS — Z3042 Encounter for surveillance of injectable contraceptive: Secondary | ICD-10-CM

## 2020-07-10 MED ORDER — MEDROXYPROGESTERONE ACETATE 150 MG/ML IM SUSP
150.0000 mg | INTRAMUSCULAR | Status: AC
  Administered 2020-07-10 – 2020-12-27 (×3): 150 mg via INTRAMUSCULAR

## 2020-07-10 NOTE — Progress Notes (Signed)
Patient here today for Depo Provera injection and is within her dates.    Last contraceptive appt was 04/12/20  Depo given in LUOQ today.  Site unremarkable & patient tolerated injection.    Next injection due 12/6-12/20.  Reminder card given.    Veronda Prude, RN

## 2020-10-02 ENCOUNTER — Other Ambulatory Visit: Payer: Self-pay

## 2020-10-02 ENCOUNTER — Ambulatory Visit (INDEPENDENT_AMBULATORY_CARE_PROVIDER_SITE_OTHER): Payer: Self-pay

## 2020-10-02 DIAGNOSIS — Z3042 Encounter for surveillance of injectable contraceptive: Secondary | ICD-10-CM

## 2020-10-04 NOTE — Progress Notes (Signed)
Patient here today for Depo Provera injection and is within her dates.    Last contraceptive appt was 04/10/2020  Depo given in RUOQ today.  Site unremarkable & patient tolerated injection.    Next injection due 12/19/2019-01/02/20.    Reminder card given.    Veronda Prude, RN

## 2020-12-27 ENCOUNTER — Ambulatory Visit (INDEPENDENT_AMBULATORY_CARE_PROVIDER_SITE_OTHER): Payer: No Typology Code available for payment source

## 2020-12-27 ENCOUNTER — Other Ambulatory Visit: Payer: Self-pay

## 2020-12-27 DIAGNOSIS — Z3042 Encounter for surveillance of injectable contraceptive: Secondary | ICD-10-CM

## 2020-12-27 NOTE — Progress Notes (Signed)
Patient here today for Depo Provera injection and is within her dates.    Last contraceptive appt was 04/12/20  Depo given in LUOQ today.  Site unremarkable & patient tolerated injection.    Next injection due 5/25-6/8.  Reminder card given.    Veronda Prude, RN

## 2021-03-28 ENCOUNTER — Ambulatory Visit (INDEPENDENT_AMBULATORY_CARE_PROVIDER_SITE_OTHER): Payer: No Typology Code available for payment source

## 2021-03-28 ENCOUNTER — Other Ambulatory Visit: Payer: Self-pay

## 2021-03-28 DIAGNOSIS — Z3042 Encounter for surveillance of injectable contraceptive: Secondary | ICD-10-CM

## 2021-03-29 MED ORDER — MEDROXYPROGESTERONE ACETATE 150 MG/ML IM SUSP
150.0000 mg | Freq: Once | INTRAMUSCULAR | Status: AC
  Administered 2021-03-28: 150 mg via INTRAMUSCULAR

## 2021-03-29 NOTE — Progress Notes (Signed)
Patient here today for Depo Provera injection and is within her dates.    Last contraceptive appt was 04/12/2020. Advised patient that she would need to schedule next depo with PCP for yearly contraceptive management appointment. Patient verbalizes understanding.   Depo given in RUOQ today.  Site unremarkable & patient tolerated injection.    Next injection due 8/24-9/7.  Reminder card given.    Veronda Prude, RN

## 2021-06-17 ENCOUNTER — Encounter: Payer: Self-pay | Admitting: Emergency Medicine

## 2021-06-17 ENCOUNTER — Encounter (HOSPITAL_COMMUNITY): Payer: Self-pay

## 2021-06-17 ENCOUNTER — Emergency Department (HOSPITAL_COMMUNITY)
Admission: EM | Admit: 2021-06-17 | Discharge: 2021-06-17 | Disposition: A | Discharge status: Home / Self Care | Payer: No Typology Code available for payment source | Attending: Emergency Medicine | Admitting: Emergency Medicine

## 2021-06-17 ENCOUNTER — Ambulatory Visit
Admission: EM | Admit: 2021-06-17 | Discharge: 2021-06-17 | Disposition: A | Discharge status: Home / Self Care | Payer: No Typology Code available for payment source | Attending: Internal Medicine | Admitting: Internal Medicine

## 2021-06-17 ENCOUNTER — Other Ambulatory Visit: Payer: Self-pay

## 2021-06-17 DIAGNOSIS — R9431 Abnormal electrocardiogram [ECG] [EKG]: Secondary | ICD-10-CM

## 2021-06-17 DIAGNOSIS — R42 Dizziness and giddiness: Secondary | ICD-10-CM | POA: Diagnosis present

## 2021-06-17 DIAGNOSIS — R079 Chest pain, unspecified: Secondary | ICD-10-CM | POA: Diagnosis not present

## 2021-06-17 DIAGNOSIS — Z5321 Procedure and treatment not carried out due to patient leaving prior to being seen by health care provider: Secondary | ICD-10-CM | POA: Diagnosis not present

## 2021-06-17 LAB — BASIC METABOLIC PANEL
Anion gap: 8 (ref 5–15)
BUN: 10 mg/dL (ref 6–20)
CO2: 25 mmol/L (ref 22–32)
Calcium: 9 mg/dL (ref 8.9–10.3)
Chloride: 105 mmol/L (ref 98–111)
Creatinine, Ser: 0.77 mg/dL (ref 0.44–1.00)
GFR, Estimated: >60 mL/min (ref >60)
Glucose, Bld: 134 mg/dL — ABNORMAL HIGH (ref 70–99)
Potassium: 2.5 mmol/L — CL (ref 3.5–5.1)
Sodium: 138 mmol/L (ref 135–145)

## 2021-06-17 LAB — CBC
HCT: 39.5 % (ref 36.0–46.0)
Hemoglobin: 13.1 g/dL (ref 12.0–15.0)
MCH: 29.6 pg (ref 26.0–34.0)
MCHC: 33.2 g/dL (ref 30.0–36.0)
MCV: 89.4 fL (ref 80.0–100.0)
Platelets: 292 10*3/uL (ref 150–400)
RBC: 4.42 MIL/uL (ref 3.87–5.11)
RDW: 13.7 % (ref 11.5–15.5)
WBC: 9.2 10*3/uL (ref 4.0–10.5)
nRBC: 0 % (ref 0.0–0.2)

## 2021-06-17 LAB — I-STAT BETA HCG BLOOD, ED (MC, WL, AP ONLY): I-stat hCG, quantitative: <5 m[IU]/mL (ref <5)

## 2021-06-17 LAB — CBG MONITORING, ED: Glucose-Capillary: 129 mg/dL — ABNORMAL HIGH (ref 70–99)

## 2021-06-17 LAB — MAGNESIUM: Magnesium: 2 mg/dL (ref 1.7–2.4)

## 2021-06-17 LAB — POCT FASTING CBG KUC MANUAL ENTRY: POCT Glucose (KUC): 164 mg/dL — AB (ref 70–99)

## 2021-06-17 NOTE — Discharge Instructions (Signed)
Patient sent to the hospital via EMS. 

## 2021-06-17 NOTE — ED Notes (Signed)
Pt requested for the IV to be removed. IV was removed by EMT. Pt left due to wait time.

## 2021-06-17 NOTE — ED Notes (Signed)
Patient is being discharged from the Urgent Care and sent to the Emergency Department via EMS . Per Henrene Dodge NP, patient is in need of higher level of care due to dizziness, nausea, possible MI. Patient is aware and verbalizes understanding of plan of care. Attempted to call report to [facility] with no answer Vitals:   06/17/21 1301  BP: (!) 165/101  Pulse: 83  Resp: 16  Temp: 97.8 F (36.6 C)  SpO2: 97%

## 2021-06-17 NOTE — ED Triage Notes (Addendum)
Patient arrives via EMS from urgent care. She was sent here for further evaluation of EKG changes. Patient was initially being evaluated for dizziness x2 days and feeling "swimmy headed". Chest Pain 2 days ago. Patient states that she is compliant with her bp medications.

## 2021-06-17 NOTE — ED Triage Notes (Signed)
Dizziness on waking this morning, patient states she's had problems with dizziness before but has never felt this bad. Reports mild headache and nausea. Dizziness worsened with movement, looking up or down. Closing eyes makes it feel better. Denies slurred speech, difficulty/changes with gait, recent upper respiratory infection, ear abnormalities or pain. Hx of HTN. Family hx of DM

## 2021-06-17 NOTE — ED Notes (Signed)
EMS arrived

## 2021-06-17 NOTE — ED Provider Notes (Signed)
[name]    CSN: [id number] Arrival date & time: 06/17/21  1241      History   Chief Complaint Chief Complaint  Patient presents with   Dizziness   Near Syncope    HPI Marie Lawrence is a 40 y.o. female.   Patient presents with constant severe dizziness that started this morning when she woke up.  Also having some headache, nausea, and blurry vision.  Dizziness is constant and is worsened with movement.  Denies any upper respiratory symptoms, ear discomfort, chest pain, shortness of breath, vomiting, diarrhea, abdominal pain.  Patient does have history of hypertension but does not check blood pressure at home regularly.  Patient has been eating and drinking appropriately over the past few days.  States that she did have right-sided chest pain yesterday.  Denies any cardiac history other than hypertension.   Dizziness Near Syncope   Past Medical History:  Diagnosis Date   History of kidney stones 08/2014   Hypertension    Kidney stone on left side 09/07/2014   09/05/14 - 9 by 13 mm left ureteral stone, multiple bilateral smaller stones    Ovarian cyst     Patient Active Problem List   Diagnosis Date Noted   Encounter for initial prescription of injectable contraceptive 03/19/2019   ASCUS of cervix with negative high risk HPV 06/26/2018   Obesity 10/25/2016   Birth control counseling 03/21/2016   Healthcare maintenance 06/20/2015   Essential hypertension 11/09/2007   TOBACCO DEPENDENCE 12/18/2006    Past Surgical History:  Procedure Laterality Date   CESAREAN SECTION  2000   CYSTOSCOPY WITH RETROGRADE PYELOGRAM, URETEROSCOPY AND STENT PLACEMENT Left 09/05/2014   Procedure: CYSTOSCOPY WITH RETROGRADE PYELOGRAM, URETEROSCOPY, STONE BASKETRY AND STENT PLACEMENT;  Surgeon: Anner Crete, MD;  Location: [facility];  Service: Urology;  Laterality: Left;   HOLMIUM LASER APPLICATION Left 09/05/2014   Procedure: HOLMIUM LASER APPLICATION;  Surgeon: Anner Crete,  MD;  Location: [facility];  Service: Urology;  Laterality: Left;  digital ureteroscope    OB History     Gravida  2   Para  2   Term  1   Preterm  1   AB  0   Living  2      SAB  0   IAB  0   Ectopic  0   Multiple  0   Live Births  2            Home Medications    Prior to Admission medications   Medication Sig Start Date End Date Taking? Authorizing Provider  amLODipine (NORVASC) 10 MG tablet TAKE 1 TABLET(10 MG) BY MOUTH DAILY 04/13/20  Yes Nestor Ramp, MD  hydrochlorothiazide (HYDRODIURIL) 25 MG tablet TAKE 1 TABLET(25 MG) BY MOUTH DAILY 01/04/20  Yes Nestor Ramp, MD  cetirizine (ZYRTEC ALLERGY) 10 MG tablet Take 1 tablet (10 mg total) by mouth daily. 06/02/20   Darr, Gerilyn Pilgrim, PA-C  fluticasone (FLONASE) 50 MCG/ACT nasal spray Place 2 sprays into both nostrils daily. 06/02/20   Darr, Gerilyn Pilgrim, PA-C  medroxyPROGESTERone (DEPO-PROVERA) 150 MG/ML injection Inject 1 mL (150 mg total) into the muscle every 3 (three) months. 09/02/12   Dessa Phi, MD    Family History Family History  Problem Relation Age of Onset   Kidney disease Father    Diabetes Father    Hypertension Maternal Aunt    Diabetes Maternal Aunt    Hypertension Maternal Grandmother    Anesthesia problems Neg  Hx    Hypotension Neg Hx    Malignant hyperthermia Neg Hx    Pseudochol deficiency Neg Hx     Social History Social History   Tobacco Use   Smoking status: Every Day    Packs/day: 0.10    Years: 12.00    Pack years: 1.20    Types: Cigarettes   Smokeless tobacco: Never  Substance Use Topics   Alcohol use: No   Drug use: No     Allergies   Penicillins   Review of Systems Review of Systems  Cardiovascular:  Positive for near-syncope.  Per HPI  Physical Exam Triage Vital Signs ED Triage Vitals  Enc Vitals Group     BP 06/17/21 1301 (!) 165/101     Pulse Rate 06/17/21 1301 83     Resp 06/17/21 1301 16     Temp 06/17/21 1301 97.8 F (36.6 C)     Temp Source 06/17/21  1301 Oral     SpO2 06/17/21 1301 97 %     Weight --      Height --      Head Circumference --      Peak Flow --      Pain Score 06/17/21 1302 3     Pain Loc --      Pain Edu? --      Excl. in GC? --    No data found.  Updated Vital Signs BP (!) 165/101 (BP Location: Left Arm)   Pulse 83   Temp 97.8 F (36.6 C) (Oral)   Resp 16   SpO2 97%   Visual Acuity Right Eye Distance:   Left Eye Distance:   Bilateral Distance:    Right Eye Near:   Left Eye Near:    Bilateral Near:     Physical Exam Constitutional:      General: She is not in acute distress.    Appearance: Normal appearance. She is ill-appearing.  HENT:     Head: Normocephalic and atraumatic.     Right Ear: Tympanic membrane and ear canal normal.     Left Ear: Tympanic membrane and ear canal normal.     Nose: Nose normal.     Mouth/Throat:     Lips: Pink.     Mouth: Mucous membranes are moist.     Pharynx: Oropharynx is clear. No posterior oropharyngeal erythema.  Eyes:     Extraocular Movements: Extraocular movements intact.     Conjunctiva/sclera: Conjunctivae normal.  Cardiovascular:     Rate and Rhythm: Normal rate and regular rhythm.     Pulses: Normal pulses.     Heart sounds: Normal heart sounds.  Pulmonary:     Effort: Pulmonary effort is normal. No respiratory distress.     Breath sounds: Normal breath sounds.  Abdominal:     General: Abdomen is flat. Bowel sounds are normal. There is no distension.     Palpations: Abdomen is soft.     Tenderness: There is no abdominal tenderness.  Musculoskeletal:        General: Normal range of motion.  Skin:    General: Skin is warm and dry.  Neurological:     General: No focal deficit present.     Mental Status: She is alert and oriented to person, place, and time. Mental status is at baseline.     Cranial Nerves: Cranial nerves are intact.     Sensory: Sensation is intact.     Motor: Motor function is intact.  Coordination: Coordination is  intact.     Gait: Gait is intact.  Psychiatric:        Mood and Affect: Mood normal.        Behavior: Behavior normal.        Thought Content: Thought content normal.        Judgment: Judgment normal.     UC Treatments / Results  Labs (all labs ordered are listed, but only abnormal results are displayed) Labs Reviewed  POCT FASTING CBG [facility] MANUAL ENTRY - Abnormal; Notable for the following components:      Result Value   POCT Glucose ([facility]) 164 (*)    All other components within normal limits    EKG   Radiology No results found.  Procedures Procedures (including critical care time)  Medications Ordered in UC Medications - No data to display  Initial Impression / Assessment and Plan / UC Course  I have reviewed the triage vital signs and the nursing notes.  Pertinent labs & imaging results that were available during my care of the patient were reviewed by me and considered in my medical decision making (see chart for details).     EKG showing ST and T wave abnormality with inferolateral changes.  Patient was sent to the hospital via EMS for further evaluation and management.  Patient was agreeable with plan. Final Clinical Impressions(s) / UC Diagnoses   Final diagnoses:  Dizziness and giddiness  Nonspecific abnormal electrocardiogram (ECG) (EKG)     Discharge Instructions      Patient sent to the hospital via EMS.      ED Prescriptions   None    PDMP not reviewed this encounter.   Lance Muss, FNP 06/17/21 1430

## 2021-06-28 ENCOUNTER — Other Ambulatory Visit: Payer: Self-pay

## 2021-06-28 ENCOUNTER — Ambulatory Visit (INDEPENDENT_AMBULATORY_CARE_PROVIDER_SITE_OTHER): Payer: No Typology Code available for payment source

## 2021-06-28 DIAGNOSIS — Z3042 Encounter for surveillance of injectable contraceptive: Secondary | ICD-10-CM | POA: Diagnosis not present

## 2021-06-28 DIAGNOSIS — Z23 Encounter for immunization: Secondary | ICD-10-CM

## 2021-06-28 LAB — POCT URINE PREGNANCY: Preg Test, Ur: NEGATIVE

## 2021-06-29 MED ORDER — MEDROXYPROGESTERONE ACETATE 150 MG/ML IM SUSP
150.0000 mg | Freq: Once | INTRAMUSCULAR | Status: AC
  Administered 2021-06-28: 150 mg via INTRAMUSCULAR

## 2021-06-29 NOTE — Progress Notes (Signed)
Patient here today for Depo Provera injection and is within her dates.    Last contraceptive appt was 04/12/2020. Patient has annual visit scheduled on 07/16/21.  Depo given in LUOQ today.  Site unremarkable & patient tolerated injection.    Next injection due 11/24-12/8.  Reminder card given.    Veronda Prude, RN

## 2021-07-16 ENCOUNTER — Ambulatory Visit (INDEPENDENT_AMBULATORY_CARE_PROVIDER_SITE_OTHER): Payer: Self-pay | Admitting: Family Medicine

## 2021-07-16 ENCOUNTER — Other Ambulatory Visit (HOSPITAL_COMMUNITY)
Admission: RE | Admit: 2021-07-16 | Discharge: 2021-07-16 | Disposition: A | Discharge status: Home / Self Care | Payer: Self-pay | Source: Ambulatory Visit | Attending: Family Medicine | Admitting: Family Medicine

## 2021-07-16 ENCOUNTER — Encounter: Payer: Self-pay | Admitting: Family Medicine

## 2021-07-16 ENCOUNTER — Other Ambulatory Visit: Payer: Self-pay

## 2021-07-16 ENCOUNTER — Telehealth: Payer: Self-pay | Admitting: *Deleted

## 2021-07-16 VITALS — BP 130/86 | HR 74 | Ht 65.0 in | Wt 189.0 lb

## 2021-07-16 DIAGNOSIS — R319 Hematuria, unspecified: Secondary | ICD-10-CM

## 2021-07-16 DIAGNOSIS — I10 Essential (primary) hypertension: Secondary | ICD-10-CM

## 2021-07-16 DIAGNOSIS — Z124 Encounter for screening for malignant neoplasm of cervix: Secondary | ICD-10-CM | POA: Insufficient documentation

## 2021-07-16 DIAGNOSIS — Z1159 Encounter for screening for other viral diseases: Secondary | ICD-10-CM

## 2021-07-16 DIAGNOSIS — R310 Gross hematuria: Secondary | ICD-10-CM

## 2021-07-16 DIAGNOSIS — F17200 Nicotine dependence, unspecified, uncomplicated: Secondary | ICD-10-CM

## 2021-07-16 DIAGNOSIS — Z01419 Encounter for gynecological examination (general) (routine) without abnormal findings: Secondary | ICD-10-CM

## 2021-07-16 LAB — POCT URINE PREGNANCY: Preg Test, Ur: NEGATIVE

## 2021-07-16 NOTE — Patient Instructions (Signed)
It was wonderful to see you today.  Please bring ALL of your medications with you to every visit.   Today we talked about:  - Labwork today, will call with results - scheduling CT to look for kidney stone - if severe pain, bleeding, dizziness, fevers, chills, vomiting/inability to keep down food or water- go to ED! - Will call with results of your pap smear!   Thank you for choosing [facility].   Please call [phone] with any questions about today's appointment.  Please be sure to schedule follow up at the front  desk before you leave today.   Burley Saver, MD  Family Medicine

## 2021-07-16 NOTE — Assessment & Plan Note (Signed)
-   no CVA tenderness, no abnormality on GU exam, pt noticing only with urination and no blood from cervix on GU exam today, will order CT renal study to assess for kidney stone, UA, urine culture, CBC and RFP today - discussed strict return precautions including worsening abdominal pain, fevers, chills, severe radiating flank pain, hematuria or vaginal bleeding that does not stop

## 2021-07-16 NOTE — Assessment & Plan Note (Signed)
Checked today per CDC guidelines

## 2021-07-16 NOTE — Assessment & Plan Note (Signed)
-   pap smear done today, Tashira CMA as chaperone, declined STI testing

## 2021-07-16 NOTE — Assessment & Plan Note (Signed)
-   precontemplative, did not want to discuss cessation today

## 2021-07-16 NOTE — Telephone Encounter (Signed)
LVM at preservice center with Lyda Jester, needing to know the cost for the pts CT study to determine if pt can afford and if she says that she can/t then we can get the appointment cancelled for her.Iva Posten Zimmerman Rumple, CMA

## 2021-07-16 NOTE — Assessment & Plan Note (Addendum)
-   normotensive today, checking RFP as one month ago she was noted to have hypokalemia of 2.5 but left ED before she was seen, today she denies chest pain, palpitations, dizziness, shortness of breath, and notes she has been trying to eat potassium rich foods, will repeat RFP today and discussed ED precautions

## 2021-07-16 NOTE — Progress Notes (Signed)
    SUBJECTIVE:   CHIEF COMPLAINT / HPI:   40 yo female presents for well woman exam.  Concerns: hematuria, intermittent back pain, for past week, no lasting pain, no fevers chills, no blood in stool, no vaginal bleeding. No dysuria, hematuria, increased urinary frequency. No changes in recent medications. Does not feel like a kidney stone that she had before but has had kidney stones earlier this year. No vaginal discharge.  Pt is a 40 y.o. yo female who presents for a well woman exam.  Family Planning: Does not desire pregnancy Folic acid: Currently not taking PNV Contraception: Currently using Depo Sexual Hx: Sexually active, 1 partners. - H/o STIs: many years ago, declines testing today Urinary incontinence symptoms: denies IPV: Pt feels same at home and in current relationships, no past hx of emotional/physical/sexual abuse.  Social hx: Drinks alcohol, never more than 4 servings at once Smoking: 0.5 ppd x 12 years, no interest in quitting Exercise: twice per week, walking  Pap smear: Last one 06/2018, results ASCUS negative HPV, due today Colonoscopy: never had Mammogram: never had  Family Hx: - Breast cancer: none - Endometrial cancer: none - Cervical cancer: none - Ovarian Cancer: none - Colon Cancer: none  HCM - due for Hep C screening - getting second COVID vaccine this Thursday    PERTINENT  PMH / PSH: as above  OBJECTIVE:   BP 130/86   Pulse 74   Ht 5\' 5"  (1.651 m)   Wt 189 lb (85.7 kg)   BMI 31.45 kg/m   General: A&O, NAD HEENT: No sign of trauma, EOM grossly intact Cardiac: RRR, no m/r/g Respiratory: CTAB, normal WOB, no w/c/r GI: Soft, NTTP, non-distended, no abdominal or suprapubic tenderness, no CVA tenderness bilaterally, no guarding or rebound, + bowel sounds Extremities: NTTP, no peripheral edema. Neuro: Normal gait, moves all four extremities appropriately. Psych: Appropriate mood and affect  GU: Normal appearance of labia majora and  minora, without lesions. Vagina tissue pink, moist, without lesions or abrasions. Cervix normal appearance, non-friable, without discharge from os. Mild bleeding present from os after pap smear, but no bleeding appreciated from os prior to this. Urethra normal in appearance as well.   ASSESSMENT/PLAN:   Need for hepatitis C screening test Checked today per CDC guidelines  Gross hematuria - no CVA tenderness, no abnormality on GU exam, pt noticing only with urination and no blood from cervix on GU exam today, will order CT renal study to assess for kidney stone, UA, urine culture, CBC and RFP today - discussed strict return precautions including worsening abdominal pain, fevers, chills, severe radiating flank pain, hematuria or vaginal bleeding that does not stop  Essential hypertension - normotensive today, checking RFP as one month ago she was noted to have hypokalemia of 2.5 but left ED before she was seen, today she denies chest pain, palpitations, dizziness, shortness of breath, and notes she has been trying to eat potassium rich foods, will repeat RFP today and discussed ED precautions  Encounter for Papanicolaou smear for cervical cancer screening - pap smear done today, Tashira CMA as chaperone, declined STI testing  TOBACCO DEPENDENCE - precontemplative, did not want to discuss cessation today     , MD [facility]

## 2021-07-16 NOTE — Telephone Encounter (Signed)
-----   Message from Billey Co, MD sent at 07/16/2021  4:20 PM EDT ----- Regarding: Uninsured [place] all,  I told patient about date/time of CT study, but she needs to know price before she goes as she does not have insurance right now. I know Clabe Seal called and left  VM, if we havent heard back by Wed AM can we cancel this CT and call and let patient know?  Thanks, Dr Miquel Dunn

## 2021-07-17 LAB — URINALYSIS, ROUTINE W REFLEX MICROSCOPIC
Bilirubin, UA: NEGATIVE
Glucose, UA: NEGATIVE
Ketones, UA: NEGATIVE
Nitrite, UA: POSITIVE — AB
Specific Gravity, UA: 1.022 (ref 1.005–1.030)
Urobilinogen, Ur: 0.2 mg/dL (ref 0.2–1.0)
pH, UA: 5.5 (ref 5.0–7.5)

## 2021-07-17 LAB — CBC
Hematocrit: 39.9 % (ref 34.0–46.6)
Hemoglobin: 13.3 g/dL (ref 11.1–15.9)
MCH: 28.9 pg (ref 26.6–33.0)
MCHC: 33.3 g/dL (ref 31.5–35.7)
MCV: 87 fL (ref 79–97)
Platelets: 304 10*3/uL (ref 150–450)
RBC: 4.61 x10E6/uL (ref 3.77–5.28)
RDW: 13.7 % (ref 11.7–15.4)
WBC: 9.8 10*3/uL (ref 3.4–10.8)

## 2021-07-17 LAB — RENAL FUNCTION PANEL
Albumin: 4.5 g/dL (ref 3.8–4.8)
BUN/Creatinine Ratio: 13 (ref 9–23)
BUN: 12 mg/dL (ref 6–20)
CO2: 23 mmol/L (ref 20–29)
Calcium: 9.5 mg/dL (ref 8.7–10.2)
Chloride: 97 mmol/L (ref 96–106)
Creatinine, Ser: 0.91 mg/dL (ref 0.57–1.00)
Glucose: 141 mg/dL — ABNORMAL HIGH (ref 70–99)
Phosphorus: 3.6 mg/dL (ref 3.0–4.3)
Potassium: 3.1 mmol/L — ABNORMAL LOW (ref 3.5–5.2)
Sodium: 139 mmol/L (ref 134–144)
eGFR: 83 mL/min/{1.73_m2} (ref >59)

## 2021-07-17 LAB — HCV INTERPRETATION

## 2021-07-17 LAB — MICROSCOPIC EXAMINATION
Casts: NONE SEEN /lpf
RBC, Urine: >30 /hpf — AB (ref 0–2)
WBC, UA: >30 /hpf — AB (ref 0–5)

## 2021-07-17 LAB — HCV AB W REFLEX TO QUANT PCR: HCV Ab: <0.1 s/co ratio (ref 0.0–0.9)

## 2021-07-17 NOTE — Telephone Encounter (Signed)
Marie Lawrence returned call yesterday afternoon and said that he would reach out to pt and give her the cost information and if she was not reached that they would give her that information when she arrived for the study.Michae Grimley Zimmerman Rumple, CMA

## 2021-07-18 ENCOUNTER — Telehealth: Payer: Self-pay | Admitting: Family Medicine

## 2021-07-18 ENCOUNTER — Ambulatory Visit (HOSPITAL_COMMUNITY): Admission: RE | Admit: 2021-07-18 | Payer: Self-pay | Source: Ambulatory Visit

## 2021-07-18 DIAGNOSIS — E876 Hypokalemia: Secondary | ICD-10-CM

## 2021-07-18 DIAGNOSIS — R310 Gross hematuria: Secondary | ICD-10-CM

## 2021-07-18 MED ORDER — POTASSIUM CHLORIDE CRYS ER 20 MEQ PO TBCR
40.0000 meq | EXTENDED_RELEASE_TABLET | Freq: Once | ORAL | 0 refills | Status: DC
Start: 2021-07-18 — End: 2022-01-21

## 2021-07-18 MED ORDER — CIPROFLOXACIN HCL 500 MG PO TABS: 500.0000 mg | ORAL_TABLET | Freq: Two times a day (BID) | ORAL | 0 refills | Status: DC

## 2021-07-18 NOTE — Telephone Encounter (Signed)
Called and discussed with patient, per chart review shows history of proteus growing in urine with susceptibility to cipro and potentially struvite stones per chart review in 2010. Discussed urinalysis showing blood, leukocytes, nitrites, culture pending. Sent ciprofloxacin 500mg  BID x 7 days to pharmacy, pt notes only allergy is penincillins. She denies dysuria, increased urinary frequency, suprapubic pain, fevers, or chills currently. She notes continued hematuria. We discussed return precautions.  She still has not heard about CT scan prices. She has been given date and time. Discussed if too expensive, will call and schedule renal sono to assess for stones.  Discussed RFP showing K 3.1, normal creatinine 0.91 (previous baseline 0.77). Discussed discontinuing home HCTZ and sent in 40 mEq Kcl PO x1.   Sent message to schedule a one week follow up to recheck BP, repeat BMP, follow up imaging results and repeat urinalysis.  MD

## 2021-07-19 ENCOUNTER — Ambulatory Visit: Payer: No Typology Code available for payment source

## 2021-07-19 NOTE — Telephone Encounter (Signed)
Called pt with information on date/time for renal US. Gave pt instructions on drinking 32oz of water one hour before appt and to not void. Pt had good understanding.  [place] October 5th 11:30 with an arrival time of 11:15.  Pt will need a full bladder.  Pt will be in for nurse visit today and will schedule a BP follow up  Sunday Spillers, CMA

## 2021-07-20 ENCOUNTER — Telehealth: Payer: Self-pay | Admitting: Family Medicine

## 2021-07-20 LAB — CYTOLOGY - PAP
Comment: NEGATIVE
Diagnosis: NEGATIVE
Diagnosis: REACTIVE
High risk HPV: NEGATIVE

## 2021-07-20 LAB — URINE CULTURE

## 2021-07-20 MED ORDER — AMOXICILLIN 500 MG PO CAPS: 500.0000 mg | ORAL_CAPSULE | Freq: Three times a day (TID) | ORAL | 0 refills | Status: AC

## 2021-07-20 NOTE — Telephone Encounter (Signed)
Called patient with results. GBS growing. Has PCN allergy. Has tolerated amoxicillin in past without issue. Allergy to PCN only itching. Rx for amoxicillin 500 mg TID for 7 days.  She reports side pain is improving. Still present intermittently. No vomiting or fevers. Reviewed reasons to call over weekend.  Called pharmacy and cancelled ciprofloxacin.  Terisa Starr, MD  Family Medicine Teaching Service

## 2021-07-25 ENCOUNTER — Other Ambulatory Visit: Payer: Self-pay

## 2021-07-25 ENCOUNTER — Ambulatory Visit (HOSPITAL_COMMUNITY)
Admission: RE | Admit: 2021-07-25 | Discharge: 2021-07-25 | Disposition: A | Discharge status: Home / Self Care | Payer: Self-pay | Source: Ambulatory Visit | Attending: Family Medicine | Admitting: Family Medicine

## 2021-07-25 DIAGNOSIS — R310 Gross hematuria: Secondary | ICD-10-CM | POA: Insufficient documentation

## 2021-07-30 ENCOUNTER — Telehealth: Payer: Self-pay | Admitting: Family Medicine

## 2021-07-30 DIAGNOSIS — N200 Calculus of kidney: Secondary | ICD-10-CM

## 2021-07-30 NOTE — Telephone Encounter (Signed)
I discussed ultrasound results with Ms. Julian Reil.  Discussed that both stones are greater than 10 mm and then is unlikely to pass spontaneously.  She notes since taking the antibiotics she tolerated them well and her symptoms have resolved.  She denies fevers, chills, back pain, hematuria, dysuria.  We discussed referral to urology, and discussed strict return precautions including the above symptoms.  Referral to urology placed  Burley Saver MD

## 2021-07-31 ENCOUNTER — Ambulatory Visit: Payer: Self-pay

## 2021-08-01 ENCOUNTER — Ambulatory Visit (INDEPENDENT_AMBULATORY_CARE_PROVIDER_SITE_OTHER): Payer: Self-pay

## 2021-08-01 ENCOUNTER — Other Ambulatory Visit: Payer: Self-pay

## 2021-08-01 DIAGNOSIS — Z23 Encounter for immunization: Secondary | ICD-10-CM

## 2022-01-02 ENCOUNTER — Ambulatory Visit: Payer: 59 | Admitting: Family Medicine

## 2022-01-07 ENCOUNTER — Ambulatory Visit: Payer: 59 | Admitting: Family Medicine

## 2022-01-07 NOTE — Patient Instructions (Incomplete)
Thank you for coming to see me today. It was a pleasure. Today we talked about:  ? ?We will get some labs today.  If they are abnormal or we need to do something about them, I will call you.  If they are normal, I will send you a message on MyChart (if it is active) or a letter in the mail.  If you don't hear from [place] in 2 weeks, please call the office at the number below.  ? ?If you would like some assistance to help quit smoking please schedule an appointment with your PCP to discuss the many ways that we can assist you if and when you decide you are ready to take the next step toward a smoke free lifestyle. You can also call the 1-800-QUIT-NOW (9207054505) or free counseling and some medications. ? ? ?Please follow-up with PCP as needed ? ?If you have any questions or concerns, please do not hesitate to call the office at [phone]. ? ?Best,  ? ?Dana Allan, MD   ?

## 2022-01-07 NOTE — Progress Notes (Deleted)
    SUBJECTIVE:   CHIEF COMPLAINT / HPI:   ***  PERTINENT  PMH / PSH: ***  OBJECTIVE:   There were no vitals taken for this visit.   General: Alert, no acute distress Cardio: Normal S1 and S2, RRR, no r/m/g Pulm: CTAB, normal work of breathing Abdomen: Bowel sounds normal. Abdomen soft and non-tender.  Extremities: No peripheral edema.  Neuro: Cranial nerves grossly intact   ASSESSMENT/PLAN:   No problem-specific Assessment & Plan notes found for this encounter.     Fahmida Jurich, MD [facility]   

## 2022-01-21 ENCOUNTER — Encounter: Payer: Self-pay | Admitting: Family Medicine

## 2022-01-21 ENCOUNTER — Ambulatory Visit (INDEPENDENT_AMBULATORY_CARE_PROVIDER_SITE_OTHER): Payer: 59 | Admitting: Family Medicine

## 2022-01-21 VITALS — BP 178/106 | HR 75 | Ht 65.0 in | Wt 196.6 lb

## 2022-01-21 DIAGNOSIS — I10 Essential (primary) hypertension: Secondary | ICD-10-CM | POA: Diagnosis not present

## 2022-01-21 DIAGNOSIS — Z3042 Encounter for surveillance of injectable contraceptive: Secondary | ICD-10-CM | POA: Diagnosis not present

## 2022-01-21 DIAGNOSIS — Z30013 Encounter for initial prescription of injectable contraceptive: Secondary | ICD-10-CM | POA: Diagnosis not present

## 2022-01-21 LAB — POCT URINE PREGNANCY: Preg Test, Ur: NEGATIVE

## 2022-01-21 MED ORDER — MEDROXYPROGESTERONE ACETATE 150 MG/ML IM SUSP
150.0000 mg | Freq: Once | INTRAMUSCULAR | Status: AC
  Administered 2022-01-21: 150 mg via INTRAMUSCULAR

## 2022-01-21 MED ORDER — AMLODIPINE BESYLATE 10 MG PO TABS: ORAL_TABLET | ORAL | 0 refills | Status: DC

## 2022-01-21 NOTE — Patient Instructions (Addendum)
Thank you for coming to see me today. It was a pleasure.  ? ?Daily recommendations of Calcium is 1200-1500 mg and Vitamin D 400-800 IU while taking Depo injections   ? ?Your next injection is due in 3 months. ? ?If you would like some assistance to help quit smoking please schedule an appointment with PCP to discuss the many ways that we can assist you if and when you decide you are ready to take the next step toward a smoke free lifestyle.  ? ?If you have any visual changes, chest pain, shortness of breath please go to the emergency department as soon as possible. ? ?Please bring in your blood pressure monitor at your next visit. ? ?Please follow-up with PCP in 1 week ? ?If you have any questions or concerns, please do not hesitate to call the office at [phone]. ? ?Best,  ? ?Dana Allan, MD   ?

## 2022-01-21 NOTE — Assessment & Plan Note (Addendum)
Uncontrolled blood pressure, 190/100.  Repeat blood pressure 178/106.  Currently asymptomatic.  Has been without antihypertensives for 3 days.  Per chart review has had history of hypokalemia since 2010 along with hypertension.  ?-We will restart amlodipine 10 mg nightly.  Patient to take 1 tablet as soon as she picks up prescription today. ?-Bmet today ?-Could consider initiating spironolactone if remains hypokalemic. ?-Check blood pressures at home, if continues to remain greater than 160/100, call MD or go directly to emergency department if having any symptoms. ?-Consider checking renin/aldosterone level at next visit.  Concern for primary hyperaldosteronism given hypertension and chronic hypokalemia. ?-Follow-up in 1 week ?

## 2022-01-21 NOTE — Assessment & Plan Note (Addendum)
Recent Pap 9/22 NILM, next due 2025 ?UPT negative ?Depo injection given today by CMA ?Repeat injection in 3 months ?Discussed  calcium and vitamin D supplementation while taking injections to help decrease bone loss ?Follow-up with PCP as as needed ?

## 2022-01-21 NOTE — Progress Notes (Signed)
Last Depo-Provera: 06/28/2021. ? ?Depo-Provera 150 mg IM given by: RUOQ. ?Next appointment due Between 6/19-7/3.  ?

## 2022-01-21 NOTE — Progress Notes (Signed)
? ? ?  SUBJECTIVE:  ? ?CHIEF COMPLAINT / HPI: blood pressure medication refill and depo injection ? ?High blood pressure ?Patient has been without blood pressure medication for 3 days.  She reports that she has been taking her mother's blood pressure medication for a little while.  Denies any visual changes, headaches, chest pain, shortness of breath or lower extremity edema.  Was taken off hydrochlorothiazide at last visit due to low potassium. ? ?PERTINENT  PMH / PSH:  ?Hypertension ?Hematuria ? ?OBJECTIVE:  ? ?BP (!) 178/106   Pulse 75   Ht 5\' 5"  (1.651 m)   Wt 196 lb 9.6 oz (89.2 kg)   SpO2 100%   BMI 32.72 kg/m?   ? ?General: Alert, no acute distress ?Cardio: Normal S1 and S2, RRR, no r/m/g ?Pulm: CTAB, normal work of breathing ?Abdomen: Bowel sounds normal. Abdomen soft and non-tender.  ?Extremities: No peripheral edema.  ? ? ?ASSESSMENT/PLAN:  ? ?Essential hypertension ?Uncontrolled blood pressure, 190/100.  Repeat blood pressure 178/106.  Currently asymptomatic.  Has been without antihypertensives for 3 days.  Per chart review has had history of hypokalemia since 2010 along with hypertension.  ?-We will restart amlodipine 10 mg nightly.  Patient to take 1 tablet as soon as she picks up prescription today. ?-Bmet today ?-Could consider initiating spironolactone if remains hypokalemic. ?-Check blood pressures at home, if continues to remain greater than 160/100, call MD or go directly to emergency department if having any symptoms. ?-Consider checking renin/aldosterone level at next visit.  Concern for primary hyperaldosteronism given hypertension and chronic hypokalemia. ?-Follow-up in 1 week ? ?Encounter for initial prescription of injectable contraceptive ?Recent Pap 9/22 NILM, next due 2025 ?UPT negative ?Depo injection given today by CMA ?Repeat injection in 3 months ?Discussed  calcium and vitamin D supplementation while taking injections to help decrease bone loss ?Follow-up with PCP as as needed ?   ? ? ?2026, MD ?[facility]  ?

## 2022-01-22 LAB — BASIC METABOLIC PANEL
BUN/Creatinine Ratio: 11 (ref 9–23)
BUN: 9 mg/dL (ref 6–20)
CO2: 28 mmol/L (ref 20–29)
Calcium: 9.5 mg/dL (ref 8.7–10.2)
Chloride: 100 mmol/L (ref 96–106)
Creatinine, Ser: 0.8 mg/dL (ref 0.57–1.00)
Glucose: 128 mg/dL — ABNORMAL HIGH (ref 70–99)
Potassium: 3.5 mmol/L (ref 3.5–5.2)
Sodium: 140 mmol/L (ref 134–144)
eGFR: 97 mL/min/{1.73_m2} (ref >59)

## 2022-01-23 ENCOUNTER — Encounter: Payer: Self-pay | Admitting: Family Medicine

## 2022-01-23 NOTE — Progress Notes (Signed)
Letter sent with normal potassium lab results. ? ?Carollee Leitz, MD ?Family Medicine Residency   ?

## 2022-01-28 ENCOUNTER — Ambulatory Visit: Payer: 59 | Admitting: Family Medicine

## 2022-01-28 NOTE — Progress Notes (Deleted)
? ? ?  SUBJECTIVE:  ? ?CHIEF COMPLAINT / HPI:  ? ?Presents for follow up for elevated blood pressure. Seen in clinic on 04/03 and Amlodipine 10 mg daily restarted.   Has been asymptomatic.  Tolerating medications well. Since then patient reports improvement in symptoms **. Associated symptoms include **.  ? ?PERTINENT  PMH / PSH:  ?HTN ?Hypokalemia ? ?OBJECTIVE:  ? ?There were no vitals taken for this visit.  ? ?General: Alert, no acute distress ?Cardio: Normal S1 and S2, RRR, no r/m/g ?Pulm: CTAB, normal work of breathing ?Abdomen: Bowel sounds normal. Abdomen soft and non-tender.  ?Extremities: No peripheral edema.  ?Neuro: Cranial nerves grossly intact ? ? ?ASSESSMENT/PLAN:  ? ?No problem-specific Assessment & Plan notes found for this encounter. ?  ? ? ?Carollee Leitz, MD ?[place]  ?

## 2022-01-28 NOTE — Patient Instructions (Incomplete)
Thank you for coming to see me today. It was a pleasure.  ? ? ? ?Continue Amlodipine 10 mg daily ? ?Please follow-up with PCP in 4 weeks ? ?If you have any questions or concerns, please do not hesitate to call the office at [phone]. ? ?Best,  ? ?Dana Allan, MD   ?

## 2022-03-26 ENCOUNTER — Encounter: Payer: Self-pay | Admitting: *Deleted

## 2022-05-03 ENCOUNTER — Other Ambulatory Visit: Payer: Self-pay | Admitting: Family Medicine

## 2022-11-05 ENCOUNTER — Encounter (INDEPENDENT_AMBULATORY_CARE_PROVIDER_SITE_OTHER): Payer: Self-pay | Admitting: Podiatry

## 2022-11-05 NOTE — Progress Notes (Signed)
NO SHOW. This encounter was created in error - please disregard.

## 2022-12-17 ENCOUNTER — Encounter: Payer: Self-pay | Admitting: Family Medicine

## 2022-12-17 ENCOUNTER — Ambulatory Visit (INDEPENDENT_AMBULATORY_CARE_PROVIDER_SITE_OTHER): Payer: 59 | Admitting: Family Medicine

## 2022-12-17 VITALS — BP 186/103 | HR 70 | Ht 65.0 in | Wt 190.4 lb

## 2022-12-17 DIAGNOSIS — Z23 Encounter for immunization: Secondary | ICD-10-CM

## 2022-12-17 DIAGNOSIS — I10 Essential (primary) hypertension: Secondary | ICD-10-CM

## 2022-12-17 DIAGNOSIS — J028 Acute pharyngitis due to other specified organisms: Secondary | ICD-10-CM | POA: Diagnosis not present

## 2022-12-17 DIAGNOSIS — Z3042 Encounter for surveillance of injectable contraceptive: Secondary | ICD-10-CM

## 2022-12-17 DIAGNOSIS — B9789 Other viral agents as the cause of diseases classified elsewhere: Secondary | ICD-10-CM

## 2022-12-17 LAB — POCT URINE PREGNANCY: Preg Test, Ur: NEGATIVE

## 2022-12-17 MED ORDER — MEDROXYPROGESTERONE ACETATE 150 MG/ML IM SUSP
150.0000 mg | Freq: Once | INTRAMUSCULAR | Status: AC
  Administered 2022-12-17: 150 mg via INTRAMUSCULAR

## 2022-12-17 MED ORDER — BENZONATATE 100 MG PO CAPS: 200.0000 mg | ORAL_CAPSULE | Freq: Three times a day (TID) | ORAL | 0 refills | Status: DC | PRN

## 2022-12-17 NOTE — Patient Instructions (Addendum)
It was wonderful to see you today.  Please bring ALL of your medications with you to every visit.   Today we talked about:  We are testing you for COVID/flu.  I do not think you need a strep test at this time. This is likely a virus that will get better with time. Continue with the symptomatic care.  I am sending a cough medication that you can take up to three times a day as needed. Continue warm drinks, honey for cough.  You can continue sore throat spray, Mucinex. You can take Tylenol every 6 hours as needed.   Return if your symptoms do not improve or if they worsen.   Thank you for coming to your visit as scheduled. We have had a large "no-show" problem lately, and this significantly limits our ability to see and care for patients. As a friendly reminder- if you cannot make your appointment please call to cancel. We do have a no show policy for those who do not cancel within 24 hours. Our policy is that if you miss or fail to cancel an appointment within 24 hours, 3 times in a 25-monthperiod, you may be dismissed from our clinic.   Thank you for choosing [facility]   Please call [phone] any questions about today's appointment.  Please be sure to schedule follow up at the front  desk before you leave today.   ASharion Settler DO PGY-3 Family Medicine

## 2022-12-17 NOTE — Progress Notes (Signed)
    SUBJECTIVE:   CHIEF COMPLAINT / HPI:   Marie Lawrence is a 42 y.o. female who presents to the [facility] clinic today to discuss the following concerns:   Sore Throat Started over the weekend, worsened this morning. Does have a yellow productive cough that has been going on since yesterday. No fevers or chills. Does feel sore from all the coughing. Has been taking Mucinex, Theraflu, alka seltzer, tea with honey. Feels like nothing is working but it does help her symptoms. Son was sick with similar symptoms within the past week but is doing better. He also had sore throat but his symptoms self resolved.   She received two COVID shots but did not obtain flu shot this year- interested in it.   Depo-Provera Overdue for this. Last injection was 01/2022. She spots every month but has not had a full cycle. Last spotting period was last week. Sexually active with one partner. She notes that it is difficult for her to remember to come in for Depo-injections but she would still like to continue with this method of contraception.   PERTINENT  PMH / PSH: Hypertension, tobacco dependence, obesity  OBJECTIVE:   BP (!) 174/101   Pulse 70   Ht 5' 5"$  (1.651 m)   Wt 190 lb 6 oz (86.4 kg)   SpO2 100%   BMI 31.68 kg/m   Vitals:   12/17/22 1445 12/17/22 1522  BP: (!) 174/101 (!) 186/103  Pulse: 70   SpO2: 100%    General: NAD, pleasant, able to participate in exam HEENT: Difficult to appreciate tonsils and oropharynx given large tongue despite use of tongue depressor. TM clear Neck: Supple, no cervical LAD  Cardiac: RRR, no murmurs. Respiratory: CTAB, normal effort, No wheezes, rales or rhonchi Extremities: no edema  Skin: warm and dry Psych: Normal affect and mood  ASSESSMENT/PLAN:   1. Depo-Provera contraceptive status Almost a year since her last Depo shot. She would like to continue on this form of contraception and will try to keep on schedule. Urine pregnancy test negative today, Depo  given.  - POCT urine pregnancy - medroxyPROGESTERone (DEPO-PROVERA) injection 150 mg  2. Need for immunization against influenza - Flu Vaccine QUAD 18moIM (Fluarix, Fluzone & Alfiuria Quad PF)  3. Sore throat (viral) Unable to examine oropharynx well as this was a difficult exam due to anatomy. Son with similar symptoms and improved with supportive care which is reassuring to be against strep infection. Additionally, centor criteria 1 point- no testing is recommended. -Continue conservative care -Rx Tessalon Perles  - Work Note provided   4. Essential hypertension Elevated on two checks today. Reports she did not take her medications today. Encouraged her to check home BP and return if continues to be elevated. May need adjustments to her HTN medications.    ASharion Settler [place]

## 2023-02-04 ENCOUNTER — Encounter: Payer: Self-pay | Admitting: Family Medicine

## 2023-02-04 ENCOUNTER — Other Ambulatory Visit: Payer: Self-pay | Admitting: Family Medicine

## 2023-03-11 ENCOUNTER — Ambulatory Visit (INDEPENDENT_AMBULATORY_CARE_PROVIDER_SITE_OTHER): Payer: 59

## 2023-03-11 DIAGNOSIS — Z30019 Encounter for initial prescription of contraceptives, unspecified: Secondary | ICD-10-CM

## 2023-03-11 MED ORDER — MEDROXYPROGESTERONE ACETATE 150 MG/ML IM SUSY
150.0000 mg | PREFILLED_SYRINGE | Freq: Once | INTRAMUSCULAR | Status: AC
  Administered 2023-03-11: 150 mg via INTRAMUSCULAR

## 2023-03-11 NOTE — Progress Notes (Signed)
Patient here today for Depo Provera injection and is within her dates.    Last contraceptive appt was 12/17/2022.  Depo given in [facility] today.  Site unremarkable & patient tolerated injection.    Next injection due 05/27/2023-06/10/2023.  Reminder card given.

## 2023-06-01 NOTE — Progress Notes (Unsigned)
    SUBJECTIVE:   CHIEF COMPLAINT / HPI: elevated BP + Depo shot today  Patient reports that she went to the dentist on Thursday and her BP 206/111. She reports that her dentist would not pull your tooth due to high blood pressures and advised patient to come to her doctor to better control her BPs. She reports being compliant with her Amlodipine. She denies any blurry vision, headache, chest pain, or SOB currently, but has some intermittent headaches in the past week, last one was on Friday. She hasn't been taking in a lot of additional salt with her foods and eats Lance Muss quite frequently.   Got her depo shot today, will follow up in about 3 months for her next shot.  PERTINENT  PMH / PSH:  Past Medical History:  Diagnosis Date   History of kidney stones 08/2014   Hypertension    Kidney stone on left side 09/07/2014   09/05/14 - 9 by 13 mm left ureteral stone, multiple bilateral smaller stones    Ovarian cyst    Past Surgical History:  Procedure Laterality Date   CESAREAN SECTION  2000   CYSTOSCOPY WITH RETROGRADE PYELOGRAM, URETEROSCOPY AND STENT PLACEMENT Left 09/05/2014   Procedure: CYSTOSCOPY WITH RETROGRADE PYELOGRAM, URETEROSCOPY, STONE BASKETRY AND STENT PLACEMENT;  Surgeon: Anner Crete, MD;  Location: [facility];  Service: Urology;  Laterality: Left;   HOLMIUM LASER APPLICATION Left 09/05/2014   Procedure: HOLMIUM LASER APPLICATION;  Surgeon: Anner Crete, MD;  Location: [facility];  Service: Urology;  Laterality: Left;  digital ureteroscope    OBJECTIVE:   BP (!) 168/100   Pulse 78   Wt 190 lb 4 oz (86.3 kg)   SpO2 100%   BMI 31.66 kg/m   General: NAD, awake and alert HEENT: Normocephalic, atraumatic. Conjunctiva normal. No nasal discharge. Neck: Supple, non-tender, no adenopathy, masses, or thyromegaly. Cardiovascular: RRR. No M/R/G Respiratory: CTAB, normal WOB on RA. No wheezing, crackles, rhonchi, or diminished breath sounds. Abdomen: Soft, non-tender, non-distended.  Bowel sounds normoactive. Extremities: No BLE edema, no deformities or significant joint findings. Skin: Warm and dry. Neuro: A&Ox3. No focal neurological deficits. Sensation and motor function intact bilaterally.  ASSESSMENT/PLAN:   Essential hypertension BP remains elevated even with medication compliance, 206/111 (at dentist office), 175/103, repeat 168/100 in office today. - Continue taking Amlodipine 10 mg - Start Losartan 25 mg, last BMP with normal kidney function and patient is not at risk for pregnancy due to being on Depo-Provera, last dose given today - Will follow-up in 2 weeks for BMP recheck - Advised patient to check her blood pressure at home with over-the-counter BP cuff and increase her Losartan to 50 mg if her blood pressure remains elevated.  Goal blood pressure 140/90.   Declined smoking cessation Discussed smoking cessation options and how this could be impacting her BP. She would not like to try any NRT at this time. Will continue discussions in the future. - Advised patient to decrease the number of cigarettes daily as it can help lower blood pressure  Fortunato Curling, DO [facility]

## 2023-06-02 ENCOUNTER — Encounter: Payer: Self-pay | Admitting: Family Medicine

## 2023-06-02 ENCOUNTER — Ambulatory Visit: Payer: 59 | Admitting: Family Medicine

## 2023-06-02 VITALS — BP 168/100 | HR 78 | Wt 190.2 lb

## 2023-06-02 DIAGNOSIS — Z3042 Encounter for surveillance of injectable contraceptive: Secondary | ICD-10-CM

## 2023-06-02 DIAGNOSIS — I10 Essential (primary) hypertension: Secondary | ICD-10-CM | POA: Diagnosis not present

## 2023-06-02 DIAGNOSIS — Z720 Tobacco use: Secondary | ICD-10-CM | POA: Insufficient documentation

## 2023-06-02 MED ORDER — MEDROXYPROGESTERONE ACETATE 150 MG/ML IM SUSP
150.0000 mg | Freq: Once | INTRAMUSCULAR | Status: AC
Start: 2023-06-02 — End: 2023-06-02
  Administered 2023-06-02: 150 mg via INTRAMUSCULAR

## 2023-06-02 MED ORDER — LOSARTAN POTASSIUM 25 MG PO TABS
25.0000 mg | ORAL_TABLET | Freq: Every day | ORAL | 0 refills | Status: DC
Start: 2023-06-02 — End: 2023-07-02

## 2023-06-02 NOTE — Patient Instructions (Signed)
It was great to see you today! Thank you for choosing [facility] for your primary care. Marie Lawrence was seen for high blood pressure.  Today we addressed: Continuing Amlodipine 10 mg Start Losartan 25 mg and follow up in 2 weeks for BMP lab recheck Check your blood pressure at home with over-the-counter BP cuff and increase your Losartan to 50 mg if her blood pressure remains elevated. Goal blood pressure 140/90. Discussed quitting smoking at a slow pace which will help lower BP.  You should return to our clinic Return in about 2 weeks (around 06/16/2023) for BMP check. Please arrive 15 minutes before your appointment to ensure smooth check in process.  We appreciate your efforts in making this happen.  Thank you for allowing me to participate in your care, Fortunato Curling, DO 06/02/2023, 11:47 AM PGY-1, [facility]

## 2023-06-02 NOTE — Assessment & Plan Note (Signed)
Discussed smoking cessation options and how this could be impacting her BP. She would not like to try any NRT at this time. Will continue discussions in the future. - Advised patient to decrease the number of cigarettes daily as it can help lower blood pressure

## 2023-06-02 NOTE — Assessment & Plan Note (Addendum)
BP remains elevated even with medication compliance, 206/111 (at dentist office), 175/103, repeat 168/100 in office today. - Continue taking Amlodipine 10 mg - Start Losartan 25 mg, last BMP with normal kidney function and patient is not at risk for pregnancy due to being on Depo-Provera, last dose given today - Will follow-up in 2 weeks for BMP recheck - Advised patient to check her blood pressure at home with over-the-counter BP cuff and increase her Losartan to 50 mg if her blood pressure remains elevated.  Goal blood pressure 140/90.

## 2023-06-09 ENCOUNTER — Other Ambulatory Visit: Payer: Self-pay | Admitting: Family Medicine

## 2023-06-10 MED ORDER — AMLODIPINE BESYLATE 10 MG PO TABS: ORAL_TABLET | ORAL | 2 refills | Status: DC

## 2023-06-16 ENCOUNTER — Other Ambulatory Visit: Payer: 59

## 2023-06-16 DIAGNOSIS — I10 Essential (primary) hypertension: Secondary | ICD-10-CM

## 2023-06-17 ENCOUNTER — Telehealth: Payer: Self-pay | Admitting: Family Medicine

## 2023-06-17 DIAGNOSIS — I10 Essential (primary) hypertension: Secondary | ICD-10-CM

## 2023-06-17 LAB — BASIC METABOLIC PANEL
BUN/Creatinine Ratio: 13 (ref 9–23)
BUN: 14 mg/dL (ref 6–20)
CO2: 24 mmol/L (ref 20–29)
Calcium: 8.8 mg/dL (ref 8.7–10.2)
Chloride: 98 mmol/L (ref 96–106)
Creatinine, Ser: 1.12 mg/dL — ABNORMAL HIGH (ref 0.57–1.00)
Glucose: 227 mg/dL — ABNORMAL HIGH (ref 70–99)
Potassium: 3 mmol/L — ABNORMAL LOW (ref 3.5–5.2)
Sodium: 139 mmol/L (ref 134–144)
eGFR: 64 mL/min/{1.73_m2} (ref >59)

## 2023-06-17 NOTE — Telephone Encounter (Signed)
Called patient regarding lab results from yesterday.  Creatinine elevated to 1.12, potassium decreased to 3.0.  Patient started on losartan 25 mg yesterday.  Per chart review patient has history of hypokalemia.  Losartan should help to increase potassium.  Will recheck creatinine in 1 week rather than 2 weeks as patient has now started ARB, and has an increase from baseline prior to starting, likely in the setting of severe uncontrolled blood pressure.  Patient not having chest pain or palpitations.  Explained that I have low concern for emergency given hypokalemia and elevated creatinine.  However, I would recommend rechecking earlier rather than in 2 weeks from her visit.  Patient amenable to rechecking BMP next Tuesday.  Patient scheduled, lab ordered.  Return precautions discussed.

## 2023-06-24 ENCOUNTER — Other Ambulatory Visit: Payer: 59

## 2023-06-24 DIAGNOSIS — I10 Essential (primary) hypertension: Secondary | ICD-10-CM

## 2023-06-25 ENCOUNTER — Telehealth: Payer: Self-pay | Admitting: Family Medicine

## 2023-06-25 LAB — BASIC METABOLIC PANEL
BUN/Creatinine Ratio: 12 (ref 9–23)
BUN: 15 mg/dL (ref 6–24)
CO2: 23 mmol/L (ref 20–29)
Calcium: 8.8 mg/dL (ref 8.7–10.2)
Chloride: 103 mmol/L (ref 96–106)
Creatinine, Ser: 1.25 mg/dL — ABNORMAL HIGH (ref 0.57–1.00)
Glucose: 145 mg/dL — ABNORMAL HIGH (ref 70–99)
Potassium: 3.2 mmol/L — ABNORMAL LOW (ref 3.5–5.2)
Sodium: 143 mmol/L (ref 134–144)
eGFR: 56 mL/min/{1.73_m2} — ABNORMAL LOW (ref >59)

## 2023-06-25 NOTE — Telephone Encounter (Signed)
Creatinine elevated to greater than 0.3, potassium stable, after started Losartan 3 weeks ago. Instructed patient to stop Losartan. Scheduled for appointment next week with me. Will re-discuss blood pressure management at that time. Patient informed provider of history of kidney stones. Will also discuss at visit next Tuesday.

## 2023-07-01 ENCOUNTER — Ambulatory Visit (INDEPENDENT_AMBULATORY_CARE_PROVIDER_SITE_OTHER): Payer: 59 | Admitting: Family Medicine

## 2023-07-01 ENCOUNTER — Other Ambulatory Visit: Payer: Self-pay | Admitting: Family Medicine

## 2023-07-01 ENCOUNTER — Encounter: Payer: Self-pay | Admitting: Family Medicine

## 2023-07-01 VITALS — BP 177/103 | HR 82 | Ht 65.0 in | Wt 196.2 lb

## 2023-07-01 DIAGNOSIS — I10 Essential (primary) hypertension: Secondary | ICD-10-CM | POA: Diagnosis not present

## 2023-07-01 DIAGNOSIS — Z23 Encounter for immunization: Secondary | ICD-10-CM | POA: Diagnosis not present

## 2023-07-01 MED ORDER — HYDROCHLOROTHIAZIDE 25 MG PO TABS: 25.0000 mg | ORAL_TABLET | Freq: Every day | ORAL | 0 refills | Status: DC

## 2023-07-01 MED ORDER — POTASSIUM CHLORIDE CRYS ER 20 MEQ PO TBCR: 20.0000 meq | EXTENDED_RELEASE_TABLET | Freq: Every day | ORAL | 0 refills | Status: DC

## 2023-07-01 NOTE — Patient Instructions (Addendum)
It was great to see you! Thank you for allowing me to participate in your care!  Our plans for today:  - I have sent a new blood pressure medication to your pharmacy. Hydrochlorothiazide. You will take this once per day as well as your amlodipine. -I have also sent to same supplementation to your pharmacy.  Please take this once daily with your blood pressure medicine. -Please follow-up in 2 weeks, please bring a recording of your blood pressures at that visit.   Please arrive 15 minutes PRIOR to your next scheduled appointment time! If you do not, this affects OTHER patients' care.  Take care and seek immediate care sooner if you develop any concerns.   Celine Mans, MD, PGY-2 [facility] 3:04 PM 07/01/2023  [facility]

## 2023-07-01 NOTE — Progress Notes (Unsigned)
    SUBJECTIVE:   CHIEF COMPLAINT / HPI: Bp f/u  Started losartan 4 weeks ago.  Had significant increase in creatinine.  Was instructed to stop.  No headaches, chest pain, shortness of breath, blurry vision.  On chart review has long history of hypertension extending back to 2012.  Has previously been on up to 3 medications for blood pressure. Has hx history of kidney stones.  States both of her parents have high blood pressure and her sister who is only 24 also was now recently diagnosed with high blood pressure.  PERTINENT  PMH / PSH: Hypertension, history of kidney stones  OBJECTIVE:   BP (!) 177/103   Pulse 82   Ht 5\' 5"  (1.651 m)   Wt 196 lb 4 oz (89 kg)   SpO2 100%   BMI 32.66 kg/m   General: NAD  Neuro: A&O Cardiovascular: RRR, no murmurs, no peripheral edema Respiratory: normal WOB on RA, CTAB, no wheezes, ronchi or rales Extremities: Moving all 4 extremities equally   ASSESSMENT/PLAN:   Assessment & Plan Essential hypertension Patient had significant acute kidney injury after starting losartan several weeks ago.  Has notable history of persistent hypokalemia as well.  To my knowledge has not been evaluated for secondary causes of hypertension.  While secondary source due to hyperaldosteronism is still unlikely as patient has very significant family history of hypertension I think it is valuable to evaluate for this.  I have placed future orders for aldosterone and renin ratio.  Patient will schedule lab visit for 2 weeks from now to obtain these in the morning.  She will then follow-up later that morning for her blood pressure.  Start HCTZ 25 mg in addition to potassium supplementation daily. Encounter for immunization Flu vaccine administered today. Precepted with Dr. Deirdre Priest.  Return in about 2 weeks (around 07/15/2023).  Celine Mans, MD [facility]

## 2023-07-01 NOTE — Assessment & Plan Note (Signed)
Patient had significant acute kidney injury after starting losartan several weeks ago.  Has notable history of persistent hypokalemia as well.  To my knowledge has not been evaluated for secondary causes of hypertension.  While secondary source due to hyperaldosteronism is still unlikely as patient has very significant family history of hypertension I think it is valuable to evaluate for this.  I have placed future orders for aldosterone and renin ratio.  Patient will schedule lab visit for 2 weeks from now to obtain these in the morning.  She will then follow-up later that morning for her blood pressure.  Start HCTZ 25 mg in addition to potassium supplementation.

## 2023-07-15 ENCOUNTER — Ambulatory Visit: Payer: 59 | Admitting: Student

## 2023-07-15 ENCOUNTER — Other Ambulatory Visit: Payer: 59

## 2023-07-16 ENCOUNTER — Other Ambulatory Visit: Payer: Self-pay | Admitting: Family Medicine

## 2023-07-16 DIAGNOSIS — I10 Essential (primary) hypertension: Secondary | ICD-10-CM

## 2023-08-17 ENCOUNTER — Other Ambulatory Visit: Payer: Self-pay | Admitting: Family Medicine

## 2023-08-17 DIAGNOSIS — I10 Essential (primary) hypertension: Secondary | ICD-10-CM

## 2023-10-30 ENCOUNTER — Encounter: Payer: Self-pay | Admitting: Family Medicine

## 2023-10-30 NOTE — Progress Notes (Signed)
    SUBJECTIVE:   CHIEF COMPLAINT / HPI:   Hypertension- taking amlodipine  10mg  daily, will check at home runs 160s-180s. Denies current headache, vision changes, chest pain, palpitations, weakness, numbness. Did not get hydrochlorothiazide  from last visit. Tried losartan  in the past and had AKI and was told to discontinue. Has persistent hypokalemia with her HTN. Does have family history of HTN.   Depo today- upreg today. Sexually active, 2 weeks ago, no periods so unsure when LMP was. No condoms. Does not desire pregnancy   OBJECTIVE:   BP (!) 160/98   Pulse 88   Wt 196 lb (88.9 kg)   SpO2 98%   BMI 32.62 kg/m   General: A&O, NAD HEENT: No sign of trauma, EOMI Cardiac: RRR, no m/r/g Respiratory: CTAB, normal WOB, no w/c/r GI: Soft, NTTP, non-distended  Extremities: NTTP, no peripheral edema. Neuro: Normal gait, moves all four extremities appropriately. Psych: Appropriate mood and affect   Neuro: Memory: Intact .  CN II: PERRL CN III, IV,VI: EOMI CV V: Normal sensation in V1, V2, V3 CVII: Symmetric smile and brow raise CN VIII: Normal hearing CN IX,X: Symmetric palate raise  CN XI: 5/5 shoulder shrug CN XII: Symmetric tongue protrusion  UE and LE strength 5/5  Sensation normal upper and lower ext's bilaterally. Strength 5/5 in upper and lower ext's bilaterally. Normal gait   ASSESSMENT/PLAN:   Assessment & Plan Encounter for Depo-Provera  contraception Upreg negative as out of window, as unsure of LMP hard to be certain not pregnant, discussed repeat upreg in 2 weeks, Depo given today Essential hypertension Not at goal, repeat elevated, asymptomatic Discussed with HTN/hypokalemia concern for disorder of aldosterone vs Cushings vs Liddles Discussed with patient will do stat BMP to recheck kidney function/K and call with lab results and decide which BP med to start, confirmed phone number and that she would be available for call Scheduled AM lab appt to evaluate  renin/aldo levels for secondary causes of HTN With her AKI  with ARB concern for renal artery issues and may need imaging, consider further imaging pending results 1 week follow up with myself to check BP  ED/return precautions discussed  Addendum:  Stat BMP due to snow predicted shows resolved Cr and mild hypokalemia Tried to call patient x2 with lab results, unable to leave VM. Sent mychart message and sent spironolactone  to pharmacy as labs returned with normal kidney function and mild hypokalemia (avoiding ARB due to previous AKI, avoiding hydrochlorothiazide  due to hypokalemia). Has lab appt scheduled Monday to repeat labs as well as renin/aldo levels and consider overnight dexamethasone  suppression test for hypercortisolism/further imaging pending results and cardiology referral. Already has 1 week follow up scheduled with myself.  Addendum: called back at 1707 and reached pt. Discussed spironolactone  25 mg at pharmacy which she notes her husband can pick up, and discussed if takes tonight can come in for lab appt on Monday or Tuesday to recheck lab work. Renin aldo/level and BMP future order placed. Discussed risk of elevating potassium and if heart racing, chest pain, to go to ED.   Rollene FORBES Keeling, MD [facility]

## 2023-10-31 ENCOUNTER — Ambulatory Visit (INDEPENDENT_AMBULATORY_CARE_PROVIDER_SITE_OTHER): Payer: Managed Care, Other (non HMO) | Admitting: Family Medicine

## 2023-10-31 ENCOUNTER — Encounter: Payer: Self-pay | Admitting: Family Medicine

## 2023-10-31 VITALS — BP 172/104 | HR 88 | Wt 196.0 lb

## 2023-10-31 DIAGNOSIS — Z3042 Encounter for surveillance of injectable contraceptive: Secondary | ICD-10-CM | POA: Diagnosis not present

## 2023-10-31 DIAGNOSIS — I10 Essential (primary) hypertension: Secondary | ICD-10-CM | POA: Diagnosis not present

## 2023-10-31 LAB — BASIC METABOLIC PANEL
BUN/Creatinine Ratio: 14 (ref 9–23)
BUN: 12 mg/dL (ref 6–24)
CO2: 27 mmol/L (ref 20–29)
Calcium: 9.6 mg/dL (ref 8.7–10.2)
Chloride: 96 mmol/L (ref 96–106)
Creatinine, Ser: 0.85 mg/dL (ref 0.57–1.00)
Glucose: 220 mg/dL — ABNORMAL HIGH (ref 70–99)
Potassium: 3.1 mmol/L — ABNORMAL LOW (ref 3.5–5.2)
Sodium: 136 mmol/L (ref 134–144)
eGFR: 89 mL/min/{1.73_m2} (ref >59)

## 2023-10-31 LAB — POCT URINE PREGNANCY: Preg Test, Ur: NEGATIVE

## 2023-10-31 MED ORDER — MEDROXYPROGESTERONE ACETATE 150 MG/ML IM SUSP
150.0000 mg | Freq: Once | INTRAMUSCULAR | Status: AC
Start: 2023-10-31 — End: 2023-10-31
  Administered 2023-10-31: 150 mg via INTRAMUSCULAR

## 2023-10-31 MED ORDER — SPIRONOLACTONE 25 MG PO TABS
25.0000 mg | ORAL_TABLET | Freq: Every day | ORAL | 0 refills | Status: DC
Start: 2023-10-31 — End: 2023-11-12

## 2023-10-31 NOTE — Patient Instructions (Addendum)
 It was wonderful to see you today.  Please bring ALL of your medications with you to every visit.   Today we talked about:  - We checked your labs today- I'll call you this afternoon based on results and with what BP medication to start  We gave you depo today.   We will see you next Monday for labs and next Friday to recheck BP with me.  If you develop chest pain, heart racing, numbness, weakness, vision changes, please go to the ED  Thank you for choosing [facility].   Please call [phone] with any questions about today's appointment.  Please arrive at least 15 minutes prior to your scheduled appointments.   If you had blood work today, I will send you a MyChart message or a letter if results are normal. Otherwise, I will give you a call.   If you had a referral placed, they will call you to set up an appointment. Please give us  a call if you don't hear back in the next 2 weeks.   If you need additional refills before your next appointment, please call your pharmacy first.   Rollene Keeling, MD  Family Medicine

## 2023-10-31 NOTE — Assessment & Plan Note (Signed)
 Not at goal, repeat elevated, asymptomatic Discussed with HTN/hypokalemia concern for disorder of aldosterone vs Cushings vs Liddles Discussed with patient will do stat BMP to recheck kidney function/K and call with lab results and decide which BP med to start, confirmed phone number and that she would be available for call Scheduled AM lab appt to evaluate renin/aldo levels for secondary causes of HTN With her AKI  with ARB concern for renal artery issues and may need imaging, consider further imaging pending results 1 week follow up with myself to check BP  ED/return precautions discussed

## 2023-11-03 ENCOUNTER — Other Ambulatory Visit: Payer: Self-pay

## 2023-11-03 DIAGNOSIS — I10 Essential (primary) hypertension: Secondary | ICD-10-CM

## 2023-11-07 ENCOUNTER — Ambulatory Visit (INDEPENDENT_AMBULATORY_CARE_PROVIDER_SITE_OTHER): Payer: Managed Care, Other (non HMO) | Admitting: Family Medicine

## 2023-11-07 VITALS — BP 179/102 | HR 96 | Ht 65.0 in | Wt 195.4 lb

## 2023-11-07 DIAGNOSIS — I10 Essential (primary) hypertension: Secondary | ICD-10-CM

## 2023-11-07 DIAGNOSIS — R739 Hyperglycemia, unspecified: Secondary | ICD-10-CM | POA: Diagnosis not present

## 2023-11-07 DIAGNOSIS — E119 Type 2 diabetes mellitus without complications: Secondary | ICD-10-CM | POA: Insufficient documentation

## 2023-11-07 DIAGNOSIS — Z23 Encounter for immunization: Secondary | ICD-10-CM | POA: Diagnosis not present

## 2023-11-07 LAB — POCT GLYCOSYLATED HEMOGLOBIN (HGB A1C): HbA1c, POC (controlled diabetic range): 9.6 % — AB (ref 0.0–7.0)

## 2023-11-07 MED ORDER — LANCETS MISC. MISC: 1.0000 | Freq: Three times a day (TID) | 0 refills | Status: AC

## 2023-11-07 MED ORDER — METFORMIN HCL ER 500 MG PO TB24: ORAL_TABLET | ORAL | 1 refills | Status: DC

## 2023-11-07 MED ORDER — BLOOD GLUCOSE TEST VI STRP: 1.0000 | ORAL_STRIP | Freq: Three times a day (TID) | Status: DC

## 2023-11-07 MED ORDER — LANCET DEVICE MISC: 1.0000 | Freq: Three times a day (TID) | 0 refills | Status: AC

## 2023-11-07 MED ORDER — BLOOD GLUCOSE TEST VI STRP: 1.0000 | ORAL_STRIP | Freq: Three times a day (TID) | Status: AC

## 2023-11-07 MED ORDER — BLOOD GLUCOSE MONITORING SUPPL DEVI: 1.0000 | Freq: Three times a day (TID) | 0 refills | Status: DC

## 2023-11-07 MED ORDER — BLOOD GLUCOSE MONITORING SUPPL DEVI: 1.0000 | Freq: Three times a day (TID) | 0 refills | Status: AC

## 2023-11-07 NOTE — Assessment & Plan Note (Signed)
Improved, still not at goal Increase spironolactone to 50mg  daily, repeat BMP at f/u on Tuesday with Dr Raymondo Band Will refer to cardiology for work-up secondary causes of HTN with hypokalemia, renin aldo levels still pending (were taken three days into starting spironolactone) ED precautions discussed including risks of hyperkalemia and if chest pain or palpitations to go to ED

## 2023-11-07 NOTE — Patient Instructions (Addendum)
It was wonderful to see you today.  Please bring ALL of your medications with you to every visit.   Today we talked about:  For your blood pressure- we repeated labs today. You can start taking 2 tabs of the spironolactone 25mg  (50 mg total) for your blood pressure and we will repeat labs next week.   We diagnosed you with diabetes today - I know this is a shock. We are going to get through this together! I am sending in supplies to check your blood sugar.   We gave your Tdap today.   I have scheduled you next week with Dr Raymondo Band to discuss insulin initiation and getting the continuous glucose monitor.  We have started a medication called metformin 500mg  twice daily, you can take this for two weeks and then increase to two pills twice daily (1000mg  twice daily total).  We will check your cholesterol today.  Thank you for choosing [facility].   Please call [phone] with any questions about today's appointment.  Please arrive at least 15 minutes prior to your scheduled appointments.   If you had blood work today, I will send you a MyChart message or a letter if results are normal. Otherwise, I will give you a call.   If you had a referral placed, they will call you to set up an appointment. Please give [place] a call if you don't hear back in the next 2 weeks.   If you need additional refills before your next appointment, please call your pharmacy first.   Burley Saver, MD  Family Medicine

## 2023-11-07 NOTE — Progress Notes (Signed)
    SUBJECTIVE:   CHIEF COMPLAINT / HPI:   F/u HTN- started on spironolactone 25mg , has been taking. Notes some increased urination but not waking her up at night.  Lab work Monday was ok. Discuss cardiology referral for secondary HTN causes and she is agreeable. NO headaches, vision changes, chest pain, shortness of breath.  Hyperglycemia- noted on BMP last week, A1c today 9.6. Pt denies prior history of diabetes. Denies polyuria or polydipsia except that she has noticed increased urination this past week with the spironolactone.   OBJECTIVE:   BP (!) 152/96   Pulse 96   Ht 5\' 5"  (1.651 m)   Wt 195 lb 6.4 oz (88.6 kg)   SpO2 100%   BMI 32.52 kg/m   General: A&O, NAD HEENT: No sign of trauma, EOM grossly intact Cardiac: RRR, no m/r/g Respiratory: CTAB, normal WOB, no w/c/r GI: Soft, NTTP, non-distended  Extremities: NTTP, no peripheral edema. Neuro: Normal gait, moves all four extremities appropriately. Psych: Appropriate mood and affect   ASSESSMENT/PLAN:   Assessment & Plan Essential hypertension Improved, still not at goal Increase spironolactone to 50mg  daily, repeat BMP at f/u on Tuesday with Dr Raymondo Band Will refer to cardiology for work-up secondary causes of HTN with hypokalemia, renin aldo levels still pending (were taken three days into starting spironolactone) ED precautions discussed including risks of hyperkalemia and if chest pain or palpitations to go to ED Type 2 diabetes mellitus without complication, unspecified whether long term insulin use (HCC) A1c today consistent with diagnosis of T2DM, without polyuria/polydipsia Did discuss due to elevated levels starting insulin and Libre/CGM trial, pt interested but understandably overwhelmed and tearful, and would like to speak with Dr Raymondo Band about this, appt next Tuesday scheduled Will start metformin 500mg  BID and titrate up to 1000mg  BID, did discuss will likely need other medications but if well controlled can likely  get off insulin onto other medications Glucometer and supplies sent in, will discuss CGM with Dr Raymondo Band next week Provided supportive listening and discussed will follow up closely, will make follow up appt after appt with Dr Raymondo Band   Due for Tdap, given today.   Billey Co, MD [facility]

## 2023-11-09 LAB — BASIC METABOLIC PANEL
BUN/Creatinine Ratio: 14 (ref 9–23)
BUN: 14 mg/dL (ref 6–24)
CO2: 23 mmol/L (ref 20–29)
Calcium: 9.4 mg/dL (ref 8.7–10.2)
Chloride: 98 mmol/L (ref 96–106)
Creatinine, Ser: 0.99 mg/dL (ref 0.57–1.00)
Glucose: 352 mg/dL — ABNORMAL HIGH (ref 70–99)
Potassium: 3.4 mmol/L — ABNORMAL LOW (ref 3.5–5.2)
Sodium: 139 mmol/L (ref 134–144)
eGFR: 74 mL/min/{1.73_m2} (ref >59)

## 2023-11-09 LAB — MICROALBUMIN / CREATININE URINE RATIO
Creatinine, Urine: 68.9 mg/dL
Microalb/Creat Ratio: 455 mg/g{creat} — ABNORMAL HIGH (ref 0–29)
Microalbumin, Urine: 313.3 ug/mL

## 2023-11-09 LAB — LIPID PANEL
Chol/HDL Ratio: 4.5 {ratio} — ABNORMAL HIGH (ref 0.0–4.4)
Cholesterol, Total: 191 mg/dL (ref 100–199)
HDL: 42 mg/dL (ref >39)
LDL Chol Calc (NIH): 134 mg/dL — ABNORMAL HIGH (ref 0–99)
Triglycerides: 83 mg/dL (ref 0–149)
VLDL Cholesterol Cal: 15 mg/dL (ref 5–40)

## 2023-11-10 ENCOUNTER — Encounter: Payer: Self-pay | Admitting: Family Medicine

## 2023-11-11 ENCOUNTER — Encounter: Payer: Self-pay | Admitting: Pharmacist

## 2023-11-11 ENCOUNTER — Ambulatory Visit (INDEPENDENT_AMBULATORY_CARE_PROVIDER_SITE_OTHER): Payer: Managed Care, Other (non HMO) | Admitting: Pharmacist

## 2023-11-11 ENCOUNTER — Encounter: Payer: Self-pay | Admitting: Family Medicine

## 2023-11-11 VITALS — BP 178/95 | HR 80 | Wt 194.0 lb

## 2023-11-11 DIAGNOSIS — E119 Type 2 diabetes mellitus without complications: Secondary | ICD-10-CM

## 2023-11-11 DIAGNOSIS — I10 Essential (primary) hypertension: Secondary | ICD-10-CM

## 2023-11-11 LAB — CBC
Hematocrit: 44.1 % (ref 34.0–46.6)
Hemoglobin: 14.1 g/dL (ref 11.1–15.9)
MCH: 27.6 pg (ref 26.6–33.0)
MCHC: 32 g/dL (ref 31.5–35.7)
MCV: 87 fL (ref 79–97)
Platelets: 281 10*3/uL (ref 150–450)
RBC: 5.1 x10E6/uL (ref 3.77–5.28)
RDW: 13.1 % (ref 11.7–15.4)
WBC: 11.2 10*3/uL — ABNORMAL HIGH (ref 3.4–10.8)

## 2023-11-11 LAB — ALDOSTERONE + RENIN ACTIVITY W/ RATIO
Aldos/Renin Ratio: 11.7 (ref 0.0–30.0)
Aldos/Renin Ratio: 18.7 (ref 0.0–30.0)
Aldosterone: 8.1 ng/dL (ref 0.0–30.0)
Aldosterone: 9.5 ng/dL (ref 0.0–30.0)
Renin Activity, Plasma: 0.509 ng/mL/h (ref 0.167–5.380)
Renin Activity, Plasma: 0.694 ng/mL/h (ref 0.167–5.380)

## 2023-11-11 LAB — BASIC METABOLIC PANEL
BUN/Creatinine Ratio: 15 (ref 9–23)
BUN: 13 mg/dL (ref 6–24)
CO2: 24 mmol/L (ref 20–29)
Calcium: 9 mg/dL (ref 8.7–10.2)
Chloride: 101 mmol/L (ref 96–106)
Creatinine, Ser: 0.88 mg/dL (ref 0.57–1.00)
Glucose: 261 mg/dL — ABNORMAL HIGH (ref 70–99)
Potassium: 3.3 mmol/L — ABNORMAL LOW (ref 3.5–5.2)
Sodium: 140 mmol/L (ref 134–144)
eGFR: 85 mL/min/{1.73_m2} (ref >59)

## 2023-11-11 NOTE — Patient Instructions (Addendum)
It was nice to see you today!  Your goal blood sugar is 80-130 before eating and less than 180 after eating.  Medication Changes: Hold Metformin until Saturday, then DECREASE dose from 500 mg twice daily to 500 mg daily.  Continue all other medication the same.   Monitor blood sugars at home and keep a log (glucometer or piece of paper) to bring with you to your next visit.  Keep up the good work with diet and exercise. Aim for a diet full of vegetables, fruit and lean meats (chicken, [place], fish). Try to limit salt intake by eating fresh or frozen vegetables (instead of canned), rinse canned vegetables prior to cooking and do not add any additional salt to meals.   Sensor Application If using the App, you can tap Help in the Main Menu to access an in-app tutorial on applying a Sensor. See below for instructions on how to download the app. Apply Sensors only on the back of your upper arm. If placed in other areas, the Sensor may not function properly and could give you inaccurate readings. Avoid areas with scars, moles, stretch marks, or lumps.   Select an area of skin that generally stays flat during your normal daily activities (no bending or folding). Choose a site that is at least 1 inch (2.5 cm) away from any injection sites. To prevent discomfort or skin irritation, you should select a different site other than the one most recently used. Wash application site using a plain soap, dry, and then clean with an alcohol wipe. This will help remove any oily residue that may prevent the sensor from sticking properly. Allow site to air dry before proceeding. Note: The area MUST be clean and dry, or the Sensor may not stay on for the full wear duration specified by your Sensor insert. 4. Unscrew the cap from the Sensor Applicator and set the cap aside.  5. Place the Sensor Applicator over the prepared site and push down firmly to apply the Sensor to your body. 6. Gently pull the Sensor Applicator  away from your body. The Sensor should now be attached to your skin. 7. Make sure the Sensor is secure after application. Put the cap back on the Sensor Applicator. Discard the used Engineer, agricultural according to local regulations.  What If My Sensor Falls Off or What If My Sensor Isn't Working? Call [employer] Customer Care Team at [phone] Available 7 days a week from 8AM-8PM EST, excluding holidays If yo have multiple sensors fall off prior to 14 days of use, contact [facility] at [phone]

## 2023-11-11 NOTE — Assessment & Plan Note (Signed)
LIBERATE Study:  -Patient provided verbal consent to participate in the study. Consent documented in electronic medical record.  -Provided education on Libre 3 CGM. Collaborated to ensure Marie Lawrence 3 app was downloaded on patient's phone. Educated on how to place sensor every 14 days, patient placed first sensor correctly and verbalized understanding of use, removal, and how to place next sensor. Discussed alarms. 8 sensors provided for a 3 month supply. Educated to contact the office if the sensor falls off early and replacements are needed before their next [employer].   Diabetes recently diagnosed (11/07/23) currently uncontrolled, recently initiated on metformin which is believed to have caused significant GI symptoms including vomiting and diarrhea. Control is suboptimal due to medication intolerance, dietary indiscretion.  -Held metformin for a couple days then Decreased dose of metformin from 500 mg BID to 500 mg once daily.  -Consider SGLT2-I at next visit given elevated UACR  -Patient educated on purpose, proper use, and potential adverse effects.  -Extensively discussed goals of glucose control, pathophysiology of diabetes, recommended lifestyle interventions, dietary effects on blood sugar control.  -Next A1c anticipated - 3 month - Liberate study visit.

## 2023-11-11 NOTE — Assessment & Plan Note (Signed)
Hypertension poorly controlled likely due to medication optimization. Dose of spironolactone was increased to 50 mg recently. Blood pressure goal of <130/80 mmHg. Medication adherence appears good.  -Continue amlodipine 10 mg daily -Continue spironolactone 50 mg daily -Reassess at next visit

## 2023-11-11 NOTE — Progress Notes (Signed)
S:     Chief Complaint  Patient presents with   Medication Management    Diabetes - Newly diagnosed - Liberate enrollment   43 y.o. female who presents for diabetes evaluation, education, and management in the context of the LIBERATE Study. Today, patient arrives in good spirits and presents without any assistance.   PMH is significant for diabetes (2025), HTN.   Patient was referred and last seen by Primary Care Provider, Dr. Miquel Dunn, on 11/07/23.  At last visit, increased dose of spironolactone from 25 mg to 50 mg, diabetes diagnosis started on metformin 500 mg bid and referred for CGM Liberate study and medication optimization.   Patient reports Diabetes was diagnosed 11/07/23.   Family/Social History: Mother and father have DM  Current diabetes medications include: Metformin 500 mg BID Current hypertension medications include: Spironolactone 50 mg daily, amlodipine 10 mg daily  Patient reports adherence to taking all medications as prescribed.   Have you been experiencing any side effects to the medications prescribed? Yes - Metformin (N/V/D)  Insurance coverage: [employer]  Patient reported dietary habits: Eats ~1-2 meals/day Breakfast: banana, pistachios, sometimes doesn't eat Lunch: subway (sandwich or salad) Dinner: cooks mostly - seafood, cut out beef Snacks: grapes, chips, doesn't snack much Drinks: sweet tea, juice   O:   Review of Systems  Gastrointestinal:  Positive for diarrhea, nausea and vomiting.  All other systems reviewed and are negative. N/V/D - related to metformin   Physical Exam Vitals reviewed.  Constitutional:      Appearance: Normal appearance.  Pulmonary:     Effort: Pulmonary effort is normal.  Neurological:     General: No focal deficit present.     Mental Status: She is alert.  Psychiatric:        Mood and Affect: Mood normal.        Behavior: Behavior normal.        Thought Content: Thought content normal.        Judgment: Judgment  normal.    Lab Results  Component Value Date   HGBA1C 9.6 (A) 11/07/2023    Vitals:   11/11/23 0918  BP: (!) 178/95  Pulse: 80  SpO2: 100%   UACR - recent - 455  Lipid Panel     Component Value Date/Time   CHOL 191 11/07/2023 1010   TRIG 83 11/07/2023 1010   HDL 42 11/07/2023 1010   CHOLHDL 4.5 (H) 11/07/2023 1010   CHOLHDL 4.0 06/20/2015 0913   VLDL 10 06/20/2015 0913   LDLCALC 134 (H) 11/07/2023 1010    Clinical Atherosclerotic Cardiovascular Disease (ASCVD): No  The 10-year ASCVD risk score (Arnett DK, et al., 2019) is: 28.9%   Values used to calculate the score:     Age: 24 years     Sex: Female     Is Non-Hispanic African American: Yes     Diabetic: No     Tobacco smoker: Yes     Systolic Blood Pressure: 178 mmHg     Is BP treated: Yes     HDL Cholesterol: 42 mg/dL     Total Cholesterol: 191 mg/dL    A/P:  LIBERATE Study:  -Patient provided verbal consent to participate in the study. Consent documented in electronic medical record.  -Provided education on Libre 3 CGM. Collaborated to ensure Marie Lawrence 3 app was downloaded on patient's phone. Educated on how to place sensor every 14 days, patient placed first sensor correctly and verbalized understanding of use, removal, and how to  place next sensor. Discussed alarms. 8 sensors provided for a 3 month supply. Educated to contact the office if the sensor falls off early and replacements are needed before their next [employer].   Diabetes recently diagnosed (11/07/23) currently uncontrolled, recently initiated on metformin which is believed to have caused significant GI symptoms including vomiting and diarrhea. Control is suboptimal due to medication intolerance, dietary indiscretion.  -Held metformin for a couple days then Decreased dose of metformin from 500 mg BID to 500 mg once daily.  -Consider SGLT2-I at next visit given elevated UACR of 455 -Patient educated on purpose, proper use, and potential adverse  effects.  -Extensively discussed goals of glucose control, pathophysiology of diabetes, recommended lifestyle interventions, dietary effects on blood sugar control.  -Next A1c anticipated - 3 month - Liberate study visit.    ASCVD risk - in patient with diabetes. Last LDL is 134 not at goal of <69 mg/dL. ASCVD risk factors include diabetes, tobacco use, hypertension and 10-year ASCVD risk score of 29.7. Moderate-to-high intensity statin indicated.  -Consider statin therapy initiation at next visit -Encourage tobacco cessation  Hypertension poorly controlled likely due to medication optimization. Dose of spironolactone was increased to 50 mg recently. Blood pressure goal of <130/80 mmHg. Medication adherence appears good.  -Continue amlodipine 10 mg daily -Continue spironolactone 50 mg daily -Reassess at next visit   Written patient instructions provided. Patient verbalized understanding of treatment plan.  Total time in face to face counseling 37 minutes.    Follow-up:  Pharmacist 11/25/23 - DM F/U. PCP clinic visit in TBD.  Patient seen with Lavona Mound, PharmD Candidate and Laqueta Jean, PharmD Candidate.

## 2023-11-12 ENCOUNTER — Other Ambulatory Visit: Payer: Self-pay | Admitting: Family Medicine

## 2023-11-12 ENCOUNTER — Telehealth: Payer: Self-pay | Admitting: Family Medicine

## 2023-11-12 DIAGNOSIS — I10 Essential (primary) hypertension: Secondary | ICD-10-CM

## 2023-11-12 MED ORDER — SPIRONOLACTONE 25 MG PO TABS: 50.0000 mg | ORAL_TABLET | Freq: Every day | ORAL | 1 refills | Status: DC

## 2023-11-12 NOTE — Telephone Encounter (Signed)
BMP order placed for Friday.

## 2023-11-12 NOTE — Telephone Encounter (Signed)
Called patient and explained two different renin/aldo levels. Discussed that they got mixed up at lab and unable to tell which is hers. Discussed that now that she is on the spironolactone the labwork would be messed up and not accurate. Recommended continuing 50mg  total spironolactone daily, getting in with cardiology, and has lab appt Friday to recheck K. Answered all questions and concerns.  Burley Saver MD

## 2023-11-12 NOTE — Progress Notes (Signed)
Patient is on a reliable contraceptive, Depo-Provera intramuscular q 3 month.  Last injection 10/31/23. Reviewed and agree with Dr Macky Lower plan.

## 2023-11-14 ENCOUNTER — Other Ambulatory Visit: Payer: Managed Care, Other (non HMO)

## 2023-11-14 DIAGNOSIS — I10 Essential (primary) hypertension: Secondary | ICD-10-CM

## 2023-11-15 ENCOUNTER — Encounter: Payer: Self-pay | Admitting: Family Medicine

## 2023-11-15 LAB — BASIC METABOLIC PANEL
BUN/Creatinine Ratio: 17 (ref 9–23)
BUN: 16 mg/dL (ref 6–24)
CO2: 21 mmol/L (ref 20–29)
Calcium: 9.6 mg/dL (ref 8.7–10.2)
Chloride: 103 mmol/L (ref 96–106)
Creatinine, Ser: 0.96 mg/dL (ref 0.57–1.00)
Glucose: 186 mg/dL — ABNORMAL HIGH (ref 70–99)
Potassium: 3.8 mmol/L (ref 3.5–5.2)
Sodium: 142 mmol/L (ref 134–144)
eGFR: 77 mL/min/{1.73_m2} (ref >59)

## 2023-11-25 ENCOUNTER — Ambulatory Visit (INDEPENDENT_AMBULATORY_CARE_PROVIDER_SITE_OTHER): Payer: Managed Care, Other (non HMO) | Admitting: Pharmacist

## 2023-11-25 ENCOUNTER — Encounter: Payer: Self-pay | Admitting: Pharmacist

## 2023-11-25 VITALS — BP 162/95 | HR 83 | Wt 193.6 lb

## 2023-11-25 DIAGNOSIS — F17200 Nicotine dependence, unspecified, uncomplicated: Secondary | ICD-10-CM

## 2023-11-25 DIAGNOSIS — E119 Type 2 diabetes mellitus without complications: Secondary | ICD-10-CM | POA: Diagnosis not present

## 2023-11-25 DIAGNOSIS — I10 Essential (primary) hypertension: Secondary | ICD-10-CM | POA: Diagnosis not present

## 2023-11-25 MED ORDER — OLMESARTAN MEDOXOMIL-HCTZ 20-12.5 MG PO TABS: 1.0000 | ORAL_TABLET | Freq: Every day | ORAL | 1 refills | Status: DC

## 2023-11-25 NOTE — Progress Notes (Signed)
 Reviewed and agree with Dr Macky Lower plan.

## 2023-11-25 NOTE — Assessment & Plan Note (Signed)
 Hypertension longstanding currently uncontrolled with systolic reading > 160 and likely needs two additional therapies to gain control. Blood pressure goal of <130/80 mmHg. Medication adherence good. History of taking and tolerating both ACE-I, ARB and hydrochlorothiazide  at various times in the past.  Blood pressure control is suboptimal due to insufficient medication regimen. -Started olmesartan -hydrochlorothiazide  20-12.5 mg once daily -Continued amlodipine  10mg  daily -Continued spironolactone  50mg  daily

## 2023-11-25 NOTE — Patient Instructions (Signed)
 It was nice to see you today!   Your goal blood sugar is 80-130 before eating and less than 180 after eating.  Medication Changes:  START olmesartan -hydrochlorothiazide  20-12.5 mg once daily  Continue all other medication the same.   Keep up the good work with diet and exercise. Aim for a diet full of vegetables, fruit and lean meats (chicken, turkey, fish). Try to limit salt intake by eating fresh or frozen vegetables (instead of canned), rinse canned vegetables prior to cooking and do not add any additional salt to meals.

## 2023-11-25 NOTE — Assessment & Plan Note (Signed)
 Diabetes longstanding currently improved. Patient is able to verbalize appropriate hypoglycemia management plan. Medication adherence appears good.  -Discontinued metformin  due to intolerance - added to medication intolerance list. -Extensively discussed pathophysiology of diabetes, recommended lifestyle interventions, dietary effects on blood sugar control. - Discussed options for alternative medications for Diabetes management including GLP and SGLT-2.   Following discussion future use of SGLT-2 may be preferred over GLP due to patient reported history of kidney stones which has had multiple times (last was fall of 2023).

## 2023-11-25 NOTE — Progress Notes (Signed)
 S:     Chief Complaint  Patient presents with   Medication Management    DM - Libre 2 Week   43 y.o. female who presents for diabetes evaluation, education, and management. Patient arrives in good spirits and presents without any assistance.   Patient was referred and last seen by Primary Care Provider, Dr. Donzetta, on 11/07/2023.   PMH is significant for  diabetes (2025), HTN. SABRA  At last visit, held metformin  for a couple days then Decreased dose of metformin  from 500 mg BID to 500 mg once daily.  She reports she was unable to tolerate even a single daily dose of metformin .    Today, patient independently applied a Libre 3 sensor on arm for second two weeks.   Patient reports Diabetes was diagnosed in 11/07/2023.   Family/Social History: Mother and father have DM   Current diabetes medications include: Metformin  500 mg BID (patient stopped taking due to intolerance) - placed on intolerance list.  Current hypertension medications include: Spironolactone  25 mg 2 tablets daily, amlodipine  10 mg daily   Patient reports adherence to taking all medications as prescribed other than metformin . (stopped taking metformin  due to intolerance)   Have you been experiencing any side effects to the medications prescribed? Yes (Metformin : N/V/D)  Insurance coverage: [employer]  Patient reports hypoglycemic events (when glucose at 89 mg/dL): felt shaky hands and dizziness  O:   Review of Systems  All other systems reviewed and are negative.   Physical Exam Constitutional:      Appearance: Normal appearance.  Pulmonary:     Effort: Pulmonary effort is normal.  Neurological:     Mental Status: She is alert. Mental status is at baseline.  Psychiatric:        Mood and Affect: Mood normal.        Behavior: Behavior normal.        Thought Content: Thought content normal.        Judgment: Judgment normal.    Libre3 CGM Download today 11/25/2023 % Time CGM is active: 96% Average Glucose:  168 mg/dL Glucose Management Indicator: 7.3  Glucose Variability: 21.4% (goal <36%) Time in Goal:  - Time in range 70-180: 70% - Time above range: 30% - Time below range: 0%   Lab Results  Component Value Date   HGBA1C 9.6 (A) 11/07/2023   Vitals:   11/25/23 0840 11/25/23 0845  BP: (!) 168/90 (!) 162/95  Pulse: 83   SpO2: 100%     Lipid Panel     Component Value Date/Time   CHOL 191 11/07/2023 1010   TRIG 83 11/07/2023 1010   HDL 42 11/07/2023 1010   CHOLHDL 4.5 (H) 11/07/2023 1010   CHOLHDL 4.0 06/20/2015 0913   VLDL 10 06/20/2015 0913   LDLCALC 134 (H) 11/07/2023 1010    Clinical Atherosclerotic Cardiovascular Disease (ASCVD): No  The 10-year ASCVD risk score (Arnett DK, et al., 2019) is: 38.4%   Values used to calculate the score:     Age: 33 years     Sex: Female     Is Non-Hispanic African American: Yes     Diabetic: Yes     Tobacco smoker: Yes     Systolic Blood Pressure: 162 mmHg     Is BP treated: Yes     HDL Cholesterol: 42 mg/dL     Total Cholesterol: 191 mg/dL    A/P: Diabetes longstanding currently improved. Patient is able to verbalize appropriate hypoglycemia management plan. Medication adherence  appears good.  -Discontinued metformin  due to intolerance - added to medication intolerance list. -Extensively discussed pathophysiology of diabetes, recommended lifestyle interventions, dietary effects on blood sugar control. - Discussed options for alternative medications for Diabetes management including GLP and SGLT-2.   Following discussion future use of SGLT-2 may be preferred over GLP due to patient reported history of kidney stones which has had multiple times (last was fall of 2023).  Hypertension longstanding currently uncontrolled with systolic reading > 160 and likely needs two additional therapies to gain control. Blood pressure goal of <130/80 mmHg. Medication adherence good. History of taking and tolerating both ACE-I, ARB and  hydrochlorothiazide  at various times in the past.  Blood pressure control is suboptimal due to insufficient medication regimen. -Started olmesartan -hydrochlorothiazide  20-12.5 mg once daily -Continued amlodipine  10mg  daily -Continued spironolactone  50mg  daily  Tobacco Use Disorder - longstanding, currently smoking ~ 10 cigarettes per day.  Uninterested in quit plan discussion at this time. Willing to consider quantity taper.  -Patient agreed to attempt reduced intake from 10 to 8 cigarettes per day.  -Reassess thoughts on tobacco at next visit.   Written patient instructions provided. Patient verbalized understanding of treatment plan.  Total time in face to face counseling 34 minutes.    Follow-up:  Pharmacist 02/25  PCP clinic visit in [place] Patient seen with Owens Cowing, PharmD Candidate.   Following visit, contacted patient's pharmacy to facilitate fill - notifiying we are aware of ARB-aldosterone agent interaction and are monitoring.  Pharmacist thanked me for update and will process prescription.

## 2023-11-25 NOTE — Assessment & Plan Note (Signed)
 Tobacco Use Disorder - longstanding, currently smoking ~ 10 cigarettes per day.  Uninterested in quit plan discussion at this time. Willing to consider quantity taper.  -Patient agreed to attempt reduced intake from 10 to 8 cigarettes per day.  -Reassess thoughts on tobacco at next visit.

## 2023-12-16 ENCOUNTER — Ambulatory Visit: Payer: Managed Care, Other (non HMO) | Admitting: Pharmacist

## 2023-12-19 ENCOUNTER — Telehealth: Payer: Self-pay | Admitting: Pharmacist

## 2023-12-19 NOTE — Telephone Encounter (Signed)
 Patient contacted for follow-up of missed Appt 12/16/2023  Reviewed CGM report and determined patient doing very well.  GMI 7.0 Glucose Avg.  153 TIR 82%  No visit scheduled - patient will consider follow-up appt when she has her next Depo shot in late March.    Total time with patient call and documentation of interaction: 9 minutes.

## 2023-12-22 NOTE — Telephone Encounter (Signed)
 Reviewed and agree with Dr Macky Lower plan.

## 2024-01-28 ENCOUNTER — Ambulatory Visit

## 2024-01-28 DIAGNOSIS — Z30019 Encounter for initial prescription of contraceptives, unspecified: Secondary | ICD-10-CM

## 2024-01-28 MED ORDER — MEDROXYPROGESTERONE ACETATE 150 MG/ML IM SUSP
150.0000 mg | Freq: Once | INTRAMUSCULAR | Status: AC
  Administered 2024-01-28: 150 mg via INTRAMUSCULAR

## 2024-01-28 NOTE — Progress Notes (Signed)
 Patient here today for Depo Provera injection and is within her dates.    Last contraceptive appt was 10/31/2023.  Depo given in RUOQ today.  Site unremarkable & patient tolerated injection.    Next injection due 04/14/2024-04/28/2024.  Reminder card given.

## 2024-01-29 ENCOUNTER — Encounter: Payer: Self-pay | Admitting: Pharmacist

## 2024-01-29 ENCOUNTER — Ambulatory Visit: Admitting: Pharmacist

## 2024-01-29 VITALS — BP 176/104 | HR 67 | Wt 191.4 lb

## 2024-01-29 DIAGNOSIS — E119 Type 2 diabetes mellitus without complications: Secondary | ICD-10-CM

## 2024-01-29 DIAGNOSIS — Z7984 Long term (current) use of oral hypoglycemic drugs: Secondary | ICD-10-CM

## 2024-01-29 DIAGNOSIS — I10 Essential (primary) hypertension: Secondary | ICD-10-CM

## 2024-01-29 LAB — POCT GLYCOSYLATED HEMOGLOBIN (HGB A1C): HbA1c, POC (controlled diabetic range): 6.2 % (ref 0.0–7.0)

## 2024-01-29 MED ORDER — SPIRONOLACTONE 50 MG PO TABS: 50.0000 mg | ORAL_TABLET | Freq: Every day | ORAL | 11 refills | Status: DC

## 2024-01-29 NOTE — Assessment & Plan Note (Signed)
 Hypertension longstanding currently uncontrolled. Blood pressure goal of <130/80 mmHg. Medication adherence good, although patient did not take BP medications, olmesartan-hydrochlorothiazide and spironolactone, this morning due to running out of supply. Blood pressure control is suboptimal due to not taking BP medications prior to appointment. -Continue amlodipine -Continue olmesartan-hydrochlorothiazide -Continue spironolactone 50mg  once daily (new prescription provided) -Labs ordered today - BMET

## 2024-01-29 NOTE — Patient Instructions (Signed)
 It was nice to see you today!  Your goal blood sugar is 80-130 before eating and less than 180 after eating.  Medication Changes: Consider starting atorvastatin (Lipitor) or rosuvastatin (Crestor) for cholesterol at next appointment.  Keep up the good work with diet and exercise. Aim for a diet full of vegetables, fruit and lean meats (chicken, [place], fish). Try to limit salt intake by eating fresh or frozen vegetables (instead of canned), rinse canned vegetables prior to cooking and do not add any additional salt to meals.

## 2024-01-29 NOTE — Progress Notes (Signed)
 S:     Chief Complaint  Patient presents with   Medication Management    Liberate 3 Month   43 y.o. female who presents for diabetes evaluation, education, and management in the context of the LIBERATE Study.  PMH is significant for T2DM, HTN, obesity, history of tobacco use.   Patient was referred and last seen by Primary Care Provider, Dr. Miquel Dunn, on 11/07/23.   At last visit, patient stopped metformin due to intolerance and started olmesartan-hydrochlorothiazide.   Today, patient arrives in good spirits and presents without any assistance.  Patient reports Diabetes was diagnosed in this year.   Current diabetes medications include: none Current hypertension medications include: amlodipine 10 mg, olmesartan-hydrochlorothiazide 20-12.5 mg, spironolactone 25 mg  Patient reports adherence to taking all medications as prescribed. Patient reports not taking BP medications. olmesartan-hydrochlorothiazide and spironolactone, this morning due to no supply. Will pick up after appointment.  Do you feel that your medications are working for you? yes, patient reports leg cramping has reduced with spironolactone Have you been experiencing any side effects to the medications prescribed? no Do you have any problems obtaining medications due to transportation or finances? no Insurance coverage: [place] Medicaid  Patient denies hypoglycemic events.  Patient reported dietary habits: Eats 2 meals/day Lunch: bread Dinner: burger (no bread), grilled chicken, salad  Patient-reported exercise habits: walks 1.5 miles few times per week  O:   Review of Systems  All other systems reviewed and are negative.   Physical Exam Constitutional:      Appearance: Normal appearance.  Pulmonary:     Effort: Pulmonary effort is normal.  Neurological:     Mental Status: She is alert.  Psychiatric:        Mood and Affect: Mood normal.        Behavior: Behavior normal.        Thought Content: Thought  content normal.        Judgment: Judgment normal.   Libre3 CGM Download today 01/29/2024 % Time CGM is active: 98% Average Glucose: 133 mg/dL Glucose Management Indicator: 6.5  Glucose Variability: 21.9% (goal <36%) Time in Goal:  - Time in range 70-180: 92% - Time above range: 8% - Time below range: 0%   Lab Results  Component Value Date   HGBA1C 6.2 01/29/2024    Vitals:   01/29/24 0941  BP: (!) 176/104  Pulse: 67  SpO2: 100%     Lipid Panel     Component Value Date/Time   CHOL 191 11/07/2023 1010   TRIG 83 11/07/2023 1010   HDL 42 11/07/2023 1010   CHOLHDL 4.5 (H) 11/07/2023 1010   CHOLHDL 4.0 06/20/2015 0913   VLDL 10 06/20/2015 0913   LDLCALC 134 (H) 11/07/2023 1010    Clinical Atherosclerotic Cardiovascular Disease (ASCVD): No  The 10-year ASCVD risk score (Arnett DK, et al., 2019) is: 53.6%   Values used to calculate the score:     Age: 54 years     Sex: Female     Is Non-Hispanic African American: Yes     Diabetic: Yes     Tobacco smoker: Yes     Systolic Blood Pressure: 176 mmHg     Is BP treated: Yes     HDL Cholesterol: 42 mg/dL     Total Cholesterol: 191 mg/dL   Patient is participating in a Managed Medicaid Plan:  Yes   A/P:  LIBERATE Study:  - 8 sensors provided for a 3 month supply. Educated to contact  the office if the sensor falls off early and replacements are needed before their next Study Appointment.   Diabetes longstanding currently improved.  -No change - no medication therapy for DM -Weight loss goal: Reduce 5 lbs and control portion size -Extensively discussed pathophysiology of diabetes, recommended lifestyle interventions, dietary effects on blood sugar control.  -Counseled on s/sx of and management of hypoglycemia.  -Next A1c anticipated July 2025.   ASCVD risk - primary prevention in patient with diabetes. Last LDL is 134 not at goal of <16 mg/dL. ASCVD risk factors include HTN, T2DM and 10-year ASCVD risk score of 53.6%.  High intensity statin indicated.  -Counseled patient on considering initiating rosuvastatin or atorvastatin at next appointment -Obtain direct LDL at next appointment  Hypertension longstanding currently uncontrolled. Blood pressure goal of <130/80 mmHg. Medication adherence good, although patient did not take BP medications, olmesartan-hydrochlorothiazide and spironolactone, this morning due to running out of supply. Blood pressure control is suboptimal due to not taking BP medications prior to appointment. -Continue amlodipine -Continue olmesartan-hydrochlorothiazide -Continue spironolactone 50mg  once daily (new prescription provided) -Labs ordered today - BMET  Written patient instructions provided. Patient verbalized understanding of treatment plan.  Total time in face to face counseling 27 minutes.    Follow-up:  Pharmacist Dr. Raymondo Band 03/09/24. PCP clinic visit in [place].  Patient seen with Threasa Heads, PharmD Candidate and Darolyn Rua, PharmD Candidate.

## 2024-01-29 NOTE — Assessment & Plan Note (Signed)
 LIBERATE Study - Diabetes longstanding currently improved with A1C today of 6.2.  -No change - no medication therapy for Diabetes -Weight loss goal: Reduce 5 lbs and control portion size -Extensively discussed pathophysiology of diabetes, recommended lifestyle interventions, dietary effects on blood sugar control.

## 2024-01-30 ENCOUNTER — Encounter: Payer: Self-pay | Admitting: Pharmacist

## 2024-01-30 LAB — BASIC METABOLIC PANEL WITH GFR
BUN/Creatinine Ratio: 15 (ref 9–23)
BUN: 13 mg/dL (ref 6–24)
CO2: 21 mmol/L (ref 20–29)
Calcium: 9.4 mg/dL (ref 8.7–10.2)
Chloride: 103 mmol/L (ref 96–106)
Creatinine, Ser: 0.88 mg/dL (ref 0.57–1.00)
Glucose: 125 mg/dL — ABNORMAL HIGH (ref 70–99)
Potassium: 3.8 mmol/L (ref 3.5–5.2)
Sodium: 141 mmol/L (ref 134–144)
eGFR: 85 mL/min/{1.73_m2} (ref >59)

## 2024-01-30 NOTE — Progress Notes (Signed)
 Reviewed and agree with Dr Macky Lower plan.

## 2024-03-08 ENCOUNTER — Other Ambulatory Visit: Payer: Self-pay | Admitting: Family Medicine

## 2024-03-08 DIAGNOSIS — I10 Essential (primary) hypertension: Secondary | ICD-10-CM

## 2024-03-09 ENCOUNTER — Ambulatory Visit (INDEPENDENT_AMBULATORY_CARE_PROVIDER_SITE_OTHER): Admitting: Pharmacist

## 2024-03-09 ENCOUNTER — Encounter: Payer: Self-pay | Admitting: Pharmacist

## 2024-03-09 VITALS — BP 134/88 | Resp 73 | Wt 188.4 lb

## 2024-03-09 DIAGNOSIS — I10 Essential (primary) hypertension: Secondary | ICD-10-CM | POA: Diagnosis not present

## 2024-03-09 DIAGNOSIS — F17200 Nicotine dependence, unspecified, uncomplicated: Secondary | ICD-10-CM

## 2024-03-09 DIAGNOSIS — E119 Type 2 diabetes mellitus without complications: Secondary | ICD-10-CM

## 2024-03-09 NOTE — Assessment & Plan Note (Signed)
 Hypertension longstanding currently near goal of blood pressure goal of <130/80 mmHg. Medication adherence appears good. -Continued amlodipine  10 mg daily -Continued olmesartan -hydrochlorothiazide  20-12.5 mg daily

## 2024-03-09 NOTE — Progress Notes (Signed)
 S:     Chief Complaint  Patient presents with   Medication Management    Diabetes follow-up / Tobacco Intake   43 y.o. female who presents for diabetes evaluation, education, and management. Patient arrives in good spirits and presents without any assistance.  Patient was referred and last seen by Primary Care Provider, Dr. Drue Gerald, on 11/07/2023. At the last pharmacy visit on 01/29/24, no medication changes were made, patient made a goal to improve diet to promote weight loss.   PMH is significant for T2DM, HTN, obesity, current tobacco use. Patient reports being diagnosed with diabetes earlier this year (2025).   Current diabetes medications include: none Current hypertension medications include: amlodipine  10 mg, olmesartan /hydrochlorothiazide  20-12.5 mg, spironolactone  50 mg Current hyperlipidemia medications include: none  Patient reports adherence to taking all medications as prescribed. Last took her blood pressure medications last night, 5/19 PM.   Do you feel that your medications are working for you? yes Have you been experiencing any side effects to the medications prescribed? no Do you have any problems obtaining medications due to transportation or finances? no Insurance coverage: [place] Medicaid - [employer]  Patient reports hypoglycemic events when  Patient reported dietary habits: Eats 2 meals/day Snacks: vegetables, some fruit Avoids bread, tortilla, chips  O:   Review of Systems  All other systems reviewed and are negative.   Physical Exam Constitutional:      Appearance: Normal appearance.  Pulmonary:     Effort: Pulmonary effort is normal.  Neurological:     Mental Status: She is alert.  Psychiatric:        Mood and Affect: Mood normal.        Behavior: Behavior normal.    Libre3 CGM Download today 03/09/24 % Time CGM is active: 96% Average Glucose: 134 mg/dL Glucose Management Indicator: 6.5%  Glucose Variability: 23.8% (goal <36%) Time in  Goal:  - Time in range 70-180: 92% - Time above range: 8% - Time below range: 0% Observed patterns:  Lab Results  Component Value Date   HGBA1C 6.2 01/29/2024   Vitals:   03/09/24 0841 03/09/24 0854  BP: (!) 165/91 134/88  Resp: (!) 73   SpO2: 100%     Lipid Panel     Component Value Date/Time   CHOL 191 11/07/2023 1010   TRIG 83 11/07/2023 1010   HDL 42 11/07/2023 1010   CHOLHDL 4.5 (H) 11/07/2023 1010   CHOLHDL 4.0 06/20/2015 0913   VLDL 10 06/20/2015 0913   LDLCALC 134 (H) 11/07/2023 1010    Clinical Atherosclerotic Cardiovascular Disease (ASCVD): No  The 10-year ASCVD risk score (Arnett DK, et al., 2019) is: 15.5%   Values used to calculate the score:     Age: 72 years     Sex: Female     Is Non-Hispanic African American: Yes     Diabetic: Yes     Tobacco smoker: Yes     Systolic Blood Pressure: 134 mmHg     Is BP treated: Yes     HDL Cholesterol: 42 mg/dL     Total Cholesterol: 191 mg/dL   Patient is participating in a Managed Medicaid Plan:  Yes   A/P: Newly diagnosed diabetes in 10/2023 currently well-controlled off medications. Patient is able to verbalize appropriate hypoglycemia management plan. Previous UACR of 455. -No change - no medication therapy for DM -Obtained UACR at visit, consider initiation of an SGLT2i if UACR >200 for renal protection and added sugar reduction -Extensively discussed  pathophysiology of diabetes, recommended lifestyle interventions, dietary effects on blood sugar control.  -Counseled on s/sx of and management of hypoglycemia.  -Next A1c anticipated July 2025 (Liberate Study - 6th month visit).   ASCVD risk - primary prevention in patient with diabetes. Last LDL is 134 mg/dL (28/4132) not at goal of <70 mg/dL. ASCVD risk factors include HTN, DM, HLD, smoking and 10-year ASCVD risk score of 53.6%. high intensity statin indicated.  Patient taking Depo-Porvera.  -Obtained direct LDL today, 03/09/24 -Consider initiation of a high  intensity statin at the next visit  Hypertension longstanding currently near goal of blood pressure goal of <130/80 mmHg. Medication adherence appears good. -Continued amlodipine  10 mg daily -Continued olmesartan -hydrochlorothiazide  20-12.5 mg daily  Tobacco use - longstanding, currently smoking ~6 cigarettes per day (decrease from 10 cigarettes earlier in 2025). Interested in quitting by the end of this year (birthday in September is shorter-term goal to quit) willing to slowly taper over the next few months.  - Patient agreed to attempt reducing to 3 cigarettes per day by the end of July - [employer] Quit Date: 06/23/2024 (her birthday) - counseled patient on the dangers of tobacco use, advised patient to stop smoking, and reviewed strategies to maximize success   Written patient instructions provided. Patient verbalized understanding of treatment plan.  Total time in face to face counseling 27 minutes.    Follow-up:  Pharmacist Mid-July (asked patient to schedule) PCP clinic visit in 04/02/2024 Patient seen with Volney Grumbles, PharmD, PGY-1 pharmacy resident.

## 2024-03-09 NOTE — Assessment & Plan Note (Signed)
 Tobacco use - longstanding, currently smoking ~6 cigarettes per day (decrease from 10 cigarettes earlier in 2025). Interested in quitting by the end of this year (birthday in September is shorter-term goal to quit) willing to slowly taper over the next few months.  - Patient agreed to attempt reducing to 3 cigarettes per day by the end of July - [employer] Quit Date: 06/23/2024 (her birthday) - counseled patient on the dangers of tobacco use, advised patient to stop smoking, and reviewed strategies to maximize success

## 2024-03-09 NOTE — Patient Instructions (Addendum)
 It was nice to see you today!  Your goal blood sugar is 80-130 before eating and less than 180 after eating.  Medication Changes: Goal - Decrease smoking to 3 cigarettes per day by the end of July  Contact [facility] to schedule follow-up appointment with Dr. Marselino Slayton in mid or late July  Continue all other medication the same.   Keep up the good work with diet and exercise. Aim for a diet full of vegetables, fruit and lean meats (chicken, [place], fish). Try to limit salt intake by eating fresh or frozen vegetables (instead of canned), rinse canned vegetables prior to cooking and do not add any additional salt to meals.

## 2024-03-09 NOTE — Assessment & Plan Note (Addendum)
 Newly diagnosed diabetes in 10/2023 currently well-controlled off medications. Patient is able to verbalize appropriate hypoglycemia management plan. Previous UACR of 455. -No change - no medication therapy for DM -Obtained UACR at visit, consider initiation of an SGLT2i if UACR >200 for renal protection and added sugar reduction -Extensively discussed pathophysiology of diabetes, recommended lifestyle interventions, dietary effects on blood sugar control.  -Counseled on s/sx of and management of hypoglycemia.  -Next A1c anticipated July 2025 (Liberate Study - 6th month visit).    ASCVD risk - primary prevention in patient with diabetes. Last LDL is 134 mg/dL (84/1324) not at goal of <70 mg/dL. ASCVD risk factors include HTN, DM, HLD, smoking and 10-year ASCVD risk score of 53.6%. high intensity statin indicated.  Patient taking Depo-Porvera.  -Obtained direct LDL today, 03/09/24 -Consider initiation of a high intensity statin at the next visit

## 2024-03-10 ENCOUNTER — Ambulatory Visit: Payer: Self-pay | Admitting: Pharmacist

## 2024-03-10 LAB — LDL CHOLESTEROL, DIRECT: LDL Direct: 134 mg/dL — ABNORMAL HIGH (ref 0–99)

## 2024-03-10 NOTE — Progress Notes (Signed)
 Reviewed and agree with Dr Macky Lower plan.

## 2024-03-11 LAB — MICROALBUMIN / CREATININE URINE RATIO
Creatinine, Urine: 118.5 mg/dL
Microalb/Creat Ratio: 112 mg/g{creat} — ABNORMAL HIGH (ref 0–29)
Microalbumin, Urine: 133.1 ug/mL

## 2024-03-11 NOTE — Telephone Encounter (Signed)
 Reviewed and agree with Dr Macky Lower plan.

## 2024-04-02 ENCOUNTER — Encounter: Payer: Self-pay | Admitting: Family Medicine

## 2024-04-02 ENCOUNTER — Ambulatory Visit (INDEPENDENT_AMBULATORY_CARE_PROVIDER_SITE_OTHER): Admitting: Family Medicine

## 2024-04-02 VITALS — BP 138/92 | HR 78 | Ht 65.0 in | Wt 188.2 lb

## 2024-04-02 DIAGNOSIS — E785 Hyperlipidemia, unspecified: Secondary | ICD-10-CM | POA: Diagnosis not present

## 2024-04-02 DIAGNOSIS — E1169 Type 2 diabetes mellitus with other specified complication: Secondary | ICD-10-CM

## 2024-04-02 DIAGNOSIS — Z1231 Encounter for screening mammogram for malignant neoplasm of breast: Secondary | ICD-10-CM | POA: Diagnosis not present

## 2024-04-02 DIAGNOSIS — Z3009 Encounter for other general counseling and advice on contraception: Secondary | ICD-10-CM

## 2024-04-02 DIAGNOSIS — Z0000 Encounter for general adult medical examination without abnormal findings: Secondary | ICD-10-CM

## 2024-04-02 DIAGNOSIS — I10 Essential (primary) hypertension: Secondary | ICD-10-CM

## 2024-04-02 DIAGNOSIS — F17200 Nicotine dependence, unspecified, uncomplicated: Secondary | ICD-10-CM

## 2024-04-02 MED ORDER — ATORVASTATIN CALCIUM 40 MG PO TABS: 40.0000 mg | ORAL_TABLET | Freq: Every day | ORAL | 3 refills | Status: DC

## 2024-04-02 NOTE — Patient Instructions (Addendum)
 It was wonderful to see you today.  Please bring ALL of your medications with you to every visit.   Today we talked about:  Starting atorvastatin 40 mg daily for your cholesterol  CONGRATS ON CUTTING BACK ON CIGARETTEs! You can do this! Please make a one month follow up with Dr Koval.  We ordered a screening mammogram for you today. You can call [facility] at [phone] to schedule this.   If you want to get the pneumonia vaccine, COVID booster, or HPV vaccines let me know!  Thank you for choosing [facility].   Please call [phone] with any questions about today's appointment.  Please arrive at least 15 minutes prior to your scheduled appointments.   If you had blood work today, I will send you a MyChart message or a letter if results are normal. Otherwise, I will give you a call.   If you had a referral placed, they will call you to set up an appointment. Please give us  a call if you don't hear back in the next 2 weeks.   If you need additional refills before your next appointment, please call your pharmacy first.   Avanell Bob, MD  Family Medicine

## 2024-04-02 NOTE — Progress Notes (Signed)
    SUBJECTIVE:   CHIEF COMPLAINT / HPI:   Pt is a 43 y.o. yo female who presents for a well woman exam.  Menstrual Hx: LMP does not get on Depo Family Planning: Does not desire pregnancy Folic acid: Currently not taking PNV Contraception: Currently using Depo, has used Depo in the past. Sexual Hx: Sexually active, 1 partners. Declines STI testing.  Diet: smaller portions of carbs, changed what she drinks Exercise:  Tobacco use: 5 cigs daily, cutting back, mostly when bored IPV: Pt feels same at home and in current relationships.  Pap smear: Last one NILM/HPV neg in 2022 Colonoscopy: Never had, no family history of colon cancer Mammogram: never had, no family history of breast cancer, interested in getting DEXA scan: Never had  Family Hx: - Breast cancer: none - Endometrial cancer: none - Cervical cancer: none - Ovarian Cancer: none - Colon Cancer: none  T2DM- has drastically changed what she is eating and drinking, cut down on portion sizes. Uses sensors, BG fasting in 90s, the highest it will get postprandial is 150s. Denies side effects, denies dizziness. Not only any medications, diet controlled.  HTN- taking spironolactone , amlodipine , and Benicar . Last took last night. Has appt with cardiology scheduled next week to discuss resistant HTN.   HLD- last LDL 134, willing to start statin.   PERTINENT  PMH / PSH: T2DM, HTN, HLD, tobacco use  OBJECTIVE:   BP (!) 138/92   Pulse 78   Ht 5' 5 (1.651 m)   Wt 188 lb 3.2 oz (85.4 kg)   SpO2 100%   BMI 31.32 kg/m   General: A&O, NAD HEENT: No sign of trauma, EOM grossly intact Cardiac: RRR, no m/r/g Respiratory: CTAB, normal WOB, no w/c/r GI: Soft, NTTP, non-distended  Extremities: NTTP, no peripheral edema. Neuro: Normal gait, moves all four extremities appropriately. Psych: Appropriate mood and affect   Dermatologic Exam: Nails: No onchomycosis. No nail bed thickening. Callouses: No callouses. No fissures. Web  spaces: No macerations or open lesions Redness/Erythema: None  Musculoskeletal Exam: No bunion, hammertoes, prominent metatarsals, collapsed arch, or previous amputation. Vascular Assessment: Pedal hair growth present. 2+ posterior tibial and dorsalis pedis pulses. Neurologic Exam: 10-gram monofilament exam, R 6/6 and L 6/6.   ASSESSMENT/PLAN:   Assessment & Plan Hyperlipidemia associated with type 2 diabetes mellitus (HCC) Start atorvastatin 40mg  daily Encounter for screening mammogram for breast cancer Given number for [facility] imaging to call and schedule screening mammogram, no family history of breast cancer Essential hypertension Repeat BP almost at goal, continue home medications TOBACCO DEPENDENCE Cut down to 5 cigarettes/day from 1 ppd, congratulated patient Contiuing to work to goal of 3 cigarettes per day when she sees Dr Koval in July Well woman exam (no gynecological exam) Declines STD testing, feels safe at home, UTD on pap Mammogram ordered as above Offered HPV, COVID, and pneumonia vaccines- she prefers to think about it at this time Encounter for counseling regarding contraception Discussed as she has been on Depo on and off for 12 years risk of bone mineral density loss Discussed other long acting forms of contraception and she declines and will continue to do Depo at this time     Charmel Cooter, MD [facility]

## 2024-04-02 NOTE — Assessment & Plan Note (Signed)
 Cut down to 5 cigarettes/day from 1 ppd, congratulated patient Contiuing to work to goal of 3 cigarettes per day when she sees Dr Koval in July

## 2024-04-02 NOTE — Assessment & Plan Note (Signed)
 Repeat BP almost at goal, continue home medications

## 2024-04-07 ENCOUNTER — Encounter: Payer: Self-pay | Admitting: Cardiology

## 2024-04-07 NOTE — Progress Notes (Deleted)
 Cardiology Office Note   Date:  04/07/2024   ID:  Marie Lawrence, DOB 06/22/1981, MRN [id number]  PCP:  Charmel Cooter, MD  Cardiologist:   None Referring:  ***  No chief complaint on file.     History of Present Illness: Marie Lawrence is a 43 y.o. female who presents for evaluation of HTN.  ***    Past Medical History:  Diagnosis Date   History of kidney stones 08/2014   Hypertension    Kidney stone on left side 09/07/2014   09/05/14 - 9 by 13 mm left ureteral stone, multiple bilateral smaller stones    Ovarian cyst     Past Surgical History:  Procedure Laterality Date   CESAREAN SECTION  2000   CYSTOSCOPY WITH RETROGRADE PYELOGRAM, URETEROSCOPY AND STENT PLACEMENT Left 09/05/2014   Procedure: CYSTOSCOPY WITH RETROGRADE PYELOGRAM, URETEROSCOPY, STONE BASKETRY AND STENT PLACEMENT;  Surgeon: Willye Harvey, MD;  Location: [facility];  Service: Urology;  Laterality: Left;   HOLMIUM LASER APPLICATION Left 09/05/2014   Procedure: HOLMIUM LASER APPLICATION;  Surgeon: Willye Harvey, MD;  Location: [facility];  Service: Urology;  Laterality: Left;  digital ureteroscope     Current Outpatient Medications  Medication Sig Dispense Refill   Accu-Chek Softclix Lancets lancets 1 each by Other route as needed.     amLODipine  (NORVASC ) 10 MG tablet TAKE 1 TABLET BY MOUTH AT NIGHT 90 tablet 2   atorvastatin (LIPITOR) 40 MG tablet Take 1 tablet (40 mg total) by mouth daily. 30 tablet 3   Blood Glucose Monitoring Suppl DEVI 1 each by Does not apply route in the morning, at noon, and at bedtime. May substitute to any manufacturer covered by patient's insurance. 1 each 0   medroxyPROGESTERone  (DEPO-PROVERA ) 150 MG/ML injection Inject 1 mL (150 mg total) into the muscle every 3 (three) months. 1 mL    olmesartan -hydrochlorothiazide  (BENICAR  HCT) 20-12.5 MG tablet TAKE 1 TABLET BY MOUTH DAILY. 30 tablet 1   spironolactone  (ALDACTONE ) 50 MG tablet Take 1 tablet (50 mg total) by mouth daily. 30  tablet 11   No current facility-administered medications for this visit.    Allergies:   Metformin  and related and Penicillins    Social History:  The patient  reports that she has been smoking cigarettes. She has a 1.2 pack-year smoking history. She has never used smokeless tobacco. She reports that she does not drink alcohol and does not use drugs.   Family History:  The patient's ***family history includes Diabetes in her father and maternal aunt; Hypertension in her maternal aunt and maternal grandmother; Kidney disease in her father.    ROS:  Please see the history of present illness.   Otherwise, review of systems are positive for {NONE DEFAULTED:18576}.   All other systems are reviewed and negative.    PHYSICAL EXAM: VS:  There were no vitals taken for this visit. , BMI There is no height or weight on file to calculate BMI. GENERAL:  Well appearing HEENT:  Pupils equal round and reactive, fundi not visualized, oral mucosa unremarkable NECK:  No jugular venous distention, waveform within normal limits, carotid upstroke brisk and symmetric, no bruits, no thyromegaly LYMPHATICS:  No cervical, inguinal adenopathy LUNGS:  Clear to auscultation bilaterally BACK:  No CVA tenderness CHEST:  Unremarkable HEART:  PMI not displaced or sustained,S1 and S2 within normal limits, no S3, no S4, no clicks, no rubs, *** murmurs ABD:  Flat, positive bowel sounds normal in frequency  in pitch, no bruits, no rebound, no guarding, no midline pulsatile mass, no hepatomegaly, no splenomegaly EXT:  2 plus pulses throughout, no edema, no cyanosis no clubbing SKIN:  No rashes no nodules NEURO:  Cranial nerves II through XII grossly intact, motor grossly intact throughout PSYCH:  Cognitively intact, oriented to person place and time    EKG:        Recent Labs: 11/03/2023: Hemoglobin 14.1; Platelets 281 01/29/2024: BUN 13; Creatinine, Ser 0.88; Potassium 3.8; Sodium 141    Lipid Panel     Component Value Date/Time   CHOL 191 11/07/2023 1010   TRIG 83 11/07/2023 1010   HDL 42 11/07/2023 1010   CHOLHDL 4.5 (H) 11/07/2023 1010   CHOLHDL 4.0 06/20/2015 0913   VLDL 10 06/20/2015 0913   LDLCALC 134 (H) 11/07/2023 1010   LDLDIRECT 134 (H) 03/09/2024 0937      Wt Readings from Last 3 Encounters:  04/02/24 188 lb 3.2 oz (85.4 kg)  03/09/24 188 lb 6.4 oz (85.5 kg)  01/29/24 191 lb 6.4 oz (86.8 kg)      Other studies Reviewed: Additional studies/ records that were reviewed today include: ***. Review of the above records demonstrates:  Please see elsewhere in the note.  ***   ASSESSMENT AND PLAN:  HTN:  ***   Current medicines are reviewed at length with the patient today.  The patient {ACTIONS; HAS/DOES NOT HAVE:19233} concerns regarding medicines.  The following changes have been made:  {PLAN; NO CHANGE:13088:s}  Labs/ tests ordered today include: *** No orders of the defined types were placed in this encounter.    Disposition:   FU with ***    Signed, Eilleen Grates, MD  04/07/2024 6:37 PM    [facility]

## 2024-04-08 ENCOUNTER — Ambulatory Visit: Payer: Self-pay | Attending: Cardiology | Admitting: Cardiology

## 2024-04-27 ENCOUNTER — Encounter: Payer: Self-pay | Admitting: Pharmacist

## 2024-04-27 ENCOUNTER — Ambulatory Visit (INDEPENDENT_AMBULATORY_CARE_PROVIDER_SITE_OTHER)

## 2024-04-27 ENCOUNTER — Ambulatory Visit: Admitting: Pharmacist

## 2024-04-27 VITALS — BP 138/86 | HR 74 | Wt 187.0 lb

## 2024-04-27 DIAGNOSIS — F17200 Nicotine dependence, unspecified, uncomplicated: Secondary | ICD-10-CM

## 2024-04-27 DIAGNOSIS — E1169 Type 2 diabetes mellitus with other specified complication: Secondary | ICD-10-CM

## 2024-04-27 DIAGNOSIS — E785 Hyperlipidemia, unspecified: Secondary | ICD-10-CM

## 2024-04-27 DIAGNOSIS — Z3042 Encounter for surveillance of injectable contraceptive: Secondary | ICD-10-CM | POA: Diagnosis present

## 2024-04-27 DIAGNOSIS — I10 Essential (primary) hypertension: Secondary | ICD-10-CM

## 2024-04-27 DIAGNOSIS — E119 Type 2 diabetes mellitus without complications: Secondary | ICD-10-CM

## 2024-04-27 LAB — POCT GLYCOSYLATED HEMOGLOBIN (HGB A1C): HbA1c, POC (controlled diabetic range): 6.1 % (ref 0.0–7.0)

## 2024-04-27 MED ORDER — ATORVASTATIN CALCIUM 40 MG PO TABS: 40.0000 mg | ORAL_TABLET | Freq: Every day | ORAL | 5 refills | Status: DC

## 2024-04-27 MED ORDER — MEDROXYPROGESTERONE ACETATE 150 MG/ML IM SUSP
150.0000 mg | Freq: Once | INTRAMUSCULAR | Status: AC
  Administered 2024-04-27: 150 mg via INTRAMUSCULAR

## 2024-04-27 MED ORDER — FREESTYLE LIBRE 3 PLUS SENSOR MISC: 11 refills | Status: AC

## 2024-04-27 MED ORDER — OLMESARTAN-AMLODIPINE-HCTZ 40-10-12.5 MG PO TABS
1.0000 | ORAL_TABLET | Freq: Every day | ORAL | 11 refills | Status: AC
Start: 2024-04-27 — End: ?

## 2024-04-27 NOTE — Progress Notes (Signed)
 S:     Chief Complaint  Patient presents with   Medication Management    Diabetes - CGM - 6 month Liberate    43 y.o. female who presents for diabetes evaluation, education, and management in the context of the LIBERATE Study. Today, patient arrives in good spirits and presents without any assistance.   Patient was referred and last seen by Primary Care Provider, Dr. Donzetta, on 04/02/24.    At last visit, patient was initiated on atorvastatin , however patient has not yet picked this up and asked for it to be resent to the pharmacy to be ready for pick-up.     PMH is significant for T2DM, HTN, obesity, history of tobacco use.    Patient reports Diabetes was diagnosed in this year.    Current diabetes medications include: none Current hypertension medications include: amlodipine  10 mg, olmesartan -hydrochlorothiazide  20-12.5 mg, spironolactone  25 mg   Patient reports adherence to taking all medications as prescribed.    Do you feel that your medications are working for you? Yes  Have you been experiencing any side effects to the medications prescribed? no Do you have any problems obtaining medications due to transportation or finances? no Insurance coverage: [place] Medicaid   Patient denies hypoglycemic events.   Patient reported dietary habits: Eats 2 meals/day   Patient-reported exercise habits: walks 1.5 miles ~ 2x times per week   O:   Review of Systems  All other systems reviewed and are negative.   Physical Exam Vitals reviewed.  Pulmonary:     Effort: Pulmonary effort is normal.  Neurological:     Mental Status: She is alert.  Psychiatric:        Mood and Affect: Mood normal.        Behavior: Behavior normal.        Thought Content: Thought content normal.        Judgment: Judgment normal.     Libre3 CGM Download today 04/27/2024 % Time CGM is active: 92% Average Glucose: 131 mg/dL Glucose Management Indicator: 6.4 Glucose Variability: 20% (goal <36%) Time  in Goal:  - Time in range 70-180: 95% - Time above range: 5% - Time below range: 0%  Lab Results  Component Value Date   HGBA1C 6.1 04/27/2024   Vitals:   04/27/24 0956  BP: 138/86  Pulse: 74  SpO2: 100%    Lipid Panel     Component Value Date/Time   CHOL 191 11/07/2023 1010   TRIG 83 11/07/2023 1010   HDL 42 11/07/2023 1010   CHOLHDL 4.5 (H) 11/07/2023 1010   CHOLHDL 4.0 06/20/2015 0913   VLDL 10 06/20/2015 0913   LDLCALC 134 (H) 11/07/2023 1010   LDLDIRECT 134 (H) 03/09/2024 0937    Clinical Atherosclerotic Cardiovascular Disease (ASCVD):  The 10-year ASCVD risk score (Arnett DK, et al., 2019) is: 18%   Values used to calculate the score:     Age: 32 years     Clincally relevant sex: Female     Is Non-Hispanic African American: Yes     Diabetic: Yes     Tobacco smoker: Yes     Systolic Blood Pressure: 138 mmHg     Is BP treated: Yes     HDL Cholesterol: 42 mg/dL     Total Cholesterol: 191 mg/dL   Patient is participating in a Managed Medicaid Plan:  Yes     A/P: LIBERATE Study: End of study period - 6 month visit - Discussed options for obtaining  continued supply of Libre 3 sensors.  Provided [employer] card for assistance and shared that coverage may be $75 per month  Diabetes remains well controlled without any medication therapy.  A1C today 6.1  Doing well with diet and exercise plan.  Patient lost 7 pounds during study period.  BMI now 31.  Reselected LOW CGM alarm to be 60 due to asymptomatic low readings in the 60s happening with regularity.  - No drug therapy planned at this time.  - Extensively discussed pathophysiology of diabetes, recommended lifestyle interventions, dietary effects on blood sugar control.    ASCVD risk - primary prevention in patient with diabetes. Last LDL is 134 not at goal of <29 mg/dL. high intensity statin indicated.  - ReOrdered  Atorvastatin  40 mg - patient plans to pick up today.   Hypertension longstanding currently with  greatly improved blood pressure. Blood pressure goal of <130/80  mmHg. Medication adherence good. Patient willing to use combination tablet to minimize pill count/copays.  - Continue Spironolactone  50mg  once daily.  - Change to Olmesartan  - Amlodipine  - hydrochlorothiazide   40/10/12.5mg  once daily combination tablet.   Tobacco Use Disorder - currently reports smoking 3 cigarettes per day.  Patient is actively working on plan to quit completely by her birthday ([birth year]).  We discussed gum or lozenge as options.  She is aware Medicaid covers these at the cost of $0  Additionally, we discussed 1800 quit now option for $0 supply of gum/lozenges. Offered follow-up in future if unable to quit by her target quit date.   Written patient instructions provided. Patient verbalized understanding of treatment plan.  Total time in face to face counseling 37 minutes.    Follow-up:  Pharmacist PRN. PCP clinic visit in TBD.

## 2024-04-27 NOTE — Progress Notes (Signed)
 Patient here today for Depo Provera  injection and is within her dates.    Last contraceptive appt was 04/02/2024  Depo given in LUOQ today.  Site unremarkable & patient tolerated injection.    Next injection due 07/13/24-07/27/24.  Reminder card given.    Chiquita JAYSON English, RN

## 2024-04-27 NOTE — Progress Notes (Signed)
 Reviewed and agree with Dr Macky Lower plan.

## 2024-04-27 NOTE — Assessment & Plan Note (Signed)
 LIBERATE Study: End of study period - 6 month visit - Discussed options for obtaining continued supply of Libre 3 sensors.  Provided [employer] card for assistance and shared that coverage may be $75 per month  Diabetes remains well controlled without any medication therapy.  A1C today 6.1  Doing well with diet and exercise plan.  Patient lost 7 pounds during study period.  BMI now 31.  Reselected LOW CGM alarm to be 60 due to asymptomatic low readings in the 60s happening with regularity.  - No drug therapy planned at this time.  - Extensively discussed pathophysiology of diabetes, recommended lifestyle interventions, dietary effects on blood sugar control.

## 2024-04-27 NOTE — Patient Instructions (Addendum)
 It was nice to see you today!  Your glucose control is excellent  Your goal blood sugar is 80-130 before eating and less than 180 after eating.  Medication Changes: Small change today -  Olmesartan  - amlodipine  - hydrochlorothiazide    40/10/12.5mg  daily  Continue all other medication the same.   Keep up the good work with diet and exercise. Aim for a diet full of vegetables, fruit and lean meats (chicken, [place], fish). Try to limit salt intake by eating fresh or frozen vegetables (instead of canned), rinse canned vegetables prior to cooking and do not add any additional salt to meals.

## 2024-04-27 NOTE — Assessment & Plan Note (Signed)
 Tobacco Use Disorder - currently reports smoking 3 cigarettes per day.  Patient is actively working on plan to quit completely by her birthday (07[birth year]).  We discussed gum or lozenge as options.  She is aware Medicaid covers these at the cost of $0  Additionally, we discussed 1800 quit now option for $0 supply of gum/lozenges. Offered follow-up in future if unable to quit by her target quit date.

## 2024-04-27 NOTE — Assessment & Plan Note (Signed)
 Hypertension longstanding currently with greatly improved blood pressure. Blood pressure goal of <130/80  mmHg. Medication adherence good. Patient willing to use combination tablet to minimize pill count/copays.  - Continue Spironolactone  50mg  once daily.  - Change to Olmesartan  - Amlodipine  - hydrochlorothiazide   40/10/12.5mg  once daily combination tablet.

## 2024-05-03 ENCOUNTER — Telehealth (INDEPENDENT_AMBULATORY_CARE_PROVIDER_SITE_OTHER): Payer: Self-pay

## 2024-05-03 NOTE — Telephone Encounter (Signed)
 Pharmacy Patient Advocate Encounter  Received notification from [facility] MEDICAID that Prior Authorization for FREESTYLE LIBRE 3 PLUS SENSORS has been DENIED.  Full denial letter will be uploaded to the media tab. See denial reason below.  Per your health plan's criteria, this product is covered if you meet the following: One of the following:  (A) All of the following:  (I) You have high blood sugars (insulin-dependent diabetes).  (II) You will use continuous glucose monitor system as prescribed.  (III) You had a face-to-face visit with your doctor to evaluate your blood sugar control (and determine that all of the above criteria has been met within six months of the request).  (B) Both of the following:  (I) You have high blood sugars (insulin-dependent diabetes).  (II) You are using an external insulin pump.  (C) You have high blood sugars during pregnancy (gestational diabetes).  PA #/Case ID/Reference #:  [id number]

## 2024-05-03 NOTE — Telephone Encounter (Signed)
 Pharmacy Patient Advocate Encounter   Received notification from CoverMyMeds that prior authorization for FreeStyle Libre 3 Plus Sensor is required/requested.   Insurance verification completed.   The patient is insured through [facility] MEDICAID .   PA required; PA submitted to above mentioned insurance via CoverMyMeds Key/confirmation #/[id number]. Status is pending

## 2024-05-07 ENCOUNTER — Encounter: Payer: Self-pay | Admitting: Advanced Practice Midwife

## 2024-05-14 NOTE — Telephone Encounter (Signed)
 Patient contacted for follow-up of need for CGM following end of LIBERATE study.  Since last contact patient reports she determined that her pharmacy was trying to charge her $187 for sensor supply.  I asked patient to go to [employer] and search for patient support as the company has tried to maintain that no-one will pay more than $75 per month.  Patient states she believes she can afford this charge.   No change in medication tx today as this patient has GMI of 6.4 with current control.  I asked patient to contact me if she is unable to afford the sensors.   Total time with patient call and documentation of interaction: 9 minutes.

## 2024-05-14 NOTE — Addendum Note (Signed)
 Addended by: AMALIA MAUDE MATSU on: 05/14/2024 03:34 PM   Modules accepted: Level of Service

## 2024-05-17 NOTE — Telephone Encounter (Signed)
 Reviewed and agree with Dr Macky Lower plan.

## 2024-07-14 ENCOUNTER — Other Ambulatory Visit: Payer: Self-pay | Admitting: Family Medicine

## 2024-08-30 ENCOUNTER — Other Ambulatory Visit: Payer: Self-pay | Admitting: Family Medicine

## 2024-08-30 ENCOUNTER — Ambulatory Visit: Admitting: Family Medicine

## 2024-08-30 ENCOUNTER — Encounter: Payer: Self-pay | Admitting: Family Medicine

## 2024-08-30 VITALS — BP 171/98 | HR 84 | Ht 65.0 in | Wt 189.0 lb

## 2024-08-30 DIAGNOSIS — E119 Type 2 diabetes mellitus without complications: Secondary | ICD-10-CM

## 2024-08-30 DIAGNOSIS — Z3009 Encounter for other general counseling and advice on contraception: Secondary | ICD-10-CM | POA: Diagnosis present

## 2024-08-30 DIAGNOSIS — R319 Hematuria, unspecified: Secondary | ICD-10-CM

## 2024-08-30 DIAGNOSIS — I10 Essential (primary) hypertension: Secondary | ICD-10-CM

## 2024-08-30 LAB — POCT URINE PREGNANCY: Preg Test, Ur: NEGATIVE

## 2024-08-30 LAB — POCT GLYCOSYLATED HEMOGLOBIN (HGB A1C): HbA1c, POC (controlled diabetic range): 5.9 % (ref 0.0–7.0)

## 2024-08-30 MED ORDER — ROSUVASTATIN CALCIUM 40 MG PO TABS: 40.0000 mg | ORAL_TABLET | Freq: Every day | ORAL | 0 refills | Status: DC

## 2024-08-30 MED ORDER — MEDROXYPROGESTERONE ACETATE 150 MG/ML IM SUSP
150.0000 mg | Freq: Once | INTRAMUSCULAR | Status: AC
  Administered 2024-08-30: 150 mg via INTRAMUSCULAR

## 2024-08-30 NOTE — Progress Notes (Unsigned)
    SUBJECTIVE:   CHIEF COMPLAINT / HPI:   Depo- needs pregnancy test first due to timing.   Patient has not been seen since July. At that time she was started on an increased dose of triple antihypertensive and her A1c has come down to acceptable level with just diet and exercise.  At that time she was also started on atorvastatin  to control her lipids.  She had to discontinue the atorvastatin  because it caused intolerable stomach cramping.  She also reports some visible hematuria today but is otherwise asymptomatic.  PERTINENT  PMH / PSH: T2DM, HTN, Tobacco use  OBJECTIVE:   BP (!) 177/101   Pulse 84   Ht 5' 5 (1.651 m)   Wt 189 lb (85.7 kg)   SpO2 92%   BMI 31.45 kg/m   General: A&O, NAD Cardiac: RRR, no m/r/g Respiratory: CTAB, normal WOB, no w/c/r GI: Soft, NTTP, non-distended  Extremities: NTTP, no peripheral edema.  ASSESSMENT/PLAN:   Assessment & Plan Birth control counseling - Urine pregnancy test negative -Administered Depo-Provera  today -Recommend scheduling a nurse visit within repeat window at next visit Hematuria, unspecified type - UA in process, will follow-up based on results Type 2 diabetes mellitus without complication, without long-term current use of insulin (HCC) - Hemoglobin A1c 5.9 today -No adjustments needed, continue monitoring every 3 months -Discontinue atorvastatin , begin rosuvastatin Essential hypertension - BP elevated today in office, patient did not take her medication yesterday and takes her medications in the evening so has not yet taken a dose today. -Advised patient to take her medications once she was able to pick them up from the pharmacy -Follow-up in 2 weeks with Dr. Jerrie scheduled -Checking CMP today   Lucie Pinal, DO [facility]

## 2024-08-30 NOTE — Assessment & Plan Note (Signed)
-   BP elevated today in office, patient did not take her medication yesterday and takes her medications in the evening so has not yet taken a dose today. -Advised patient to take her medications once she was able to pick them up from the pharmacy -Follow-up in 2 weeks with Dr. Jerrie scheduled -Checking CMP today

## 2024-08-30 NOTE — Assessment & Plan Note (Signed)
-   Hemoglobin A1c 5.9 today -No adjustments needed, continue monitoring every 3 months -Discontinue atorvastatin , begin rosuvastatin

## 2024-08-30 NOTE — Patient Instructions (Addendum)
 It was wonderful to see you today!  Your blood pressure was not at goal today. Please continue to take your medications every day, even when you feel well, as high blood pressure won't make you feel sick most of the time.   We made the following changes to your medications:  -discontinued atorvastatin  -started rosuvastatin  We checked the following labs today:  -BMET -A1c -Liver Function -Urine test - pregnancy test  If there are any abnormal results, you will receive a phone call from me to discuss next steps/follow up. If everything is normal, your results will be available in MyChart within the next few days.   You will follow up with Dr. Jerrie on 11/20 at 4pm. Please be sure to take your blood pressure medications the day before your appointment.   For questions or concerns, please reach out via MyChart, or by calling the front desk at [phone]. For medication refills, please contact your pharmacy.  Be well, Dr. Lucie Pinal, DO

## 2024-08-31 LAB — BASIC METABOLIC PANEL WITH GFR
BUN/Creatinine Ratio: 17 (ref 9–23)
BUN: 18 mg/dL (ref 6–24)
CO2: 23 mmol/L (ref 20–29)
Calcium: 9.1 mg/dL (ref 8.7–10.2)
Chloride: 101 mmol/L (ref 96–106)
Creatinine, Ser: 1.09 mg/dL — ABNORMAL HIGH (ref 0.57–1.00)
Glucose: 68 mg/dL — ABNORMAL LOW (ref 70–99)
Potassium: 3.3 mmol/L — ABNORMAL LOW (ref 3.5–5.2)
Sodium: 139 mmol/L (ref 134–144)
eGFR: 65 mL/min/1.73 (ref >59)

## 2024-08-31 LAB — URINALYSIS
Glucose, UA: NEGATIVE
Nitrite, UA: POSITIVE — AB
Specific Gravity, UA: 1.026 (ref 1.005–1.030)
Urobilinogen, Ur: 1 mg/dL (ref 0.2–1.0)
pH, UA: 6.5 (ref 5.0–7.5)

## 2024-08-31 LAB — HEPATIC FUNCTION PANEL
ALT: 18 IU/L (ref 0–32)
AST: 15 IU/L (ref 0–40)
Albumin: 4.3 g/dL (ref 3.9–4.9)
Alkaline Phosphatase: 84 IU/L (ref 41–116)
Bilirubin Total: 0.3 mg/dL (ref 0.0–1.2)
Bilirubin, Direct: 0.11 mg/dL (ref 0.00–0.40)
Total Protein: 7.1 g/dL (ref 6.0–8.5)

## 2024-09-09 ENCOUNTER — Ambulatory Visit: Payer: Self-pay

## 2024-09-09 VITALS — BP 152/91 | HR 82 | Ht 65.0 in | Wt 186.0 lb

## 2024-09-09 DIAGNOSIS — I10 Essential (primary) hypertension: Secondary | ICD-10-CM

## 2024-09-09 MED ORDER — SPIRONOLACTONE 100 MG PO TABS
100.0000 mg | ORAL_TABLET | Freq: Every day | ORAL | 3 refills | Status: AC
Start: 2024-09-09 — End: ?

## 2024-09-09 NOTE — Patient Instructions (Signed)
 Thank you for visiting the clinic today, it was good to see you!  Our plans for today: - Increase Spironolactone  to 100 mg every day - this was sent to your pharmacy - Return in 2 weeks for blood pressure check and labs - Please bring your blood pressure monitor at the visit - Keep a blood pressure log:  Blood Pressure Record Sheet To take your blood pressure, you will need a blood pressure machine. You can buy a blood pressure machine (blood pressure monitor) at your clinic, drug store, or online. When choosing one, consider: An automatic monitor that has an arm cuff. A cuff that wraps snugly around your upper arm. You should be able to fit only one finger between your arm and the cuff. A device that stores blood pressure reading results. Do not choose a monitor that measures your blood pressure from your wrist or finger. Follow your health care provider's instructions for how to take your blood pressure. To use this form: Take your blood pressure medications every day These measurements should be taken when you have been at rest for at least 10-15 min Take at least 2 readings with each blood pressure check. This makes sure the results are correct. Wait 1-2 minutes between measurements. Write down the results in the spaces on this form. Keep in mind it should always be recorded systolic over diastolic. Both numbers are important.  Repeat this every day for 2-3 weeks, or as told by your health care provider.  Make a follow-up appointment with your health care provider to discuss the results.  Blood Pressure Log Date Medications taken? (Y/N) Blood Pressure Time of Day                                                                                                       Please arrive 15 minutes PRIOR to your next scheduled appointment time! If you do not, this affects OTHER patients' care.  For any questions, please call the office at [phone] or send me a message in  MyChart.  It was a pleasure to take care of you today. Have a great day!  Mally Gavina, DO [facility] Resident, PGY-1  -----------------------------------------------------------------------------  Do you need your medications delivered to your home?   We'll send your prescription to the [facility] for delivery.          Address: [address] [place], [place], [place] [address]          Phone: [phone]  Please call the [facility] to speak with a pharmacist and set up your home medication delivery. If you have any questions, feel free to contact us  -- we're happy to help!  Other [facility] that offer affordable prices on both prescriptions and over-the-counter items, as well as convenient services like vaccinations, are  [facility], at [facility]         Address: [address], [place], [place] [address]         Phone: [phone]  [facility]  [employer], located in the [facility] Center        Address: [address] [place], [place] [address]        Phone: [phone]  [facility], at [facility]       Address: [address], [place], [place] [address]       Phone: [phone]  [facility], at [facility]       Address: [address], [place], [place] [address]       Phone: [phone]  -----------------------------------------------------------------------------  Food Resources SNAP/ Food benefits - [place] [phone] [place]: [address] [place]: [address] [facility]) Nutrition program [place]: [phone] [place]: [phone] [facility] Helping [facility] - Free Produce & Food (Every Thursday 9AM to 11:00AM) [employer] [phone] [address], [place], [place]: [address]; [phone] [place]: [address]; [phone]  The [facility] [employer] app, formally [employer], connects those living in [place], [place] & surrounding areas with healthy food options & access to emergency resources. This app aims to alleviate some of the barriers to food access by making the many ways people in [place], [place] & surrounding areas can access food resources publicly available.    This app is the product of the [employer], whose mission is to coordinate and improve the effectiveness of entities in [place] [employer] focused on alleviating hunger by creating and executing citywide and neighborhood-focused initiatives to develop more just and sustainable food systems.

## 2024-09-09 NOTE — Progress Notes (Signed)
    SUBJECTIVE:   CHIEF COMPLAINT / HPI:   EUNIE LAWN is a 43 y.o.female who presents for blood pressure follow up. Currently taking olmesartan -amlodipine -hydrochlorothiazide  40-10-12.5 mg daily at bedtime. She sometimes checks BP at home and states she gets readings as high as SBP 200 but is unsure if this is correct  PERTINENT  PMH / PSH: HTN, T2DM  OBJECTIVE:   BP (!) 154/90   Pulse 90   Ht 5' 5 (1.651 m)   Wt 186 lb (84.4 kg)   SpO2 100%   BMI 30.95 kg/m    General: Alert, well-appearing female in NAD.  Cardiovascular: RRR, no m/r/g appreciated. Pulmonary: Normal WOB. CTAB with no w/c/r present. Abdomen: Soft, non-tender, non-distended. Extremities: Warm and well-perfused, without cyanosis or edema. Neurologic: Normal gait, moves all four extremities appropriately Skin: No rashes or lesions. Psych: Appropriate mood and affect  ASSESSMENT/PLAN:   Assessment & Plan Essential hypertension BP not well controlled with current medication regimen.  - Increased spironolactone  from 50 mg to 100 mg daily - Continue olmesartan -amlodipine -hydrochlorothiazide  40-10-12.5 mg daily at bedtime - Encouraged keeping a blood pressure log and bringing to next visit along with home blood pressure monitor to ensure it is correct - Return in 2 wks for BP check and repeat BMP   Darren Jernigan, DO [facility]

## 2024-09-13 NOTE — Assessment & Plan Note (Addendum)
 BP not well controlled with current medication regimen.  - Increased spironolactone  from 50 mg to 100 mg daily - Continue olmesartan -amlodipine -hydrochlorothiazide  40-10-12.5 mg daily at bedtime - Encouraged keeping a blood pressure log and bringing to next visit along with home blood pressure monitor to ensure it is correct - Return in 2 wks for BP check and repeat BMP

## 2024-09-23 ENCOUNTER — Other Ambulatory Visit: Payer: Self-pay

## 2024-09-23 ENCOUNTER — Ambulatory Visit
